# Patient Record
Sex: Female | Born: 1950
Health system: Southern US, Community
[De-identification: ages and names within clinical notes are randomized; demographics above are authoritative.]

## PROBLEM LIST (undated history)

## (undated) DIAGNOSIS — N3941 Urge incontinence: Secondary | ICD-10-CM

## (undated) DIAGNOSIS — M5136 Other intervertebral disc degeneration, lumbar region: Secondary | ICD-10-CM

## (undated) DIAGNOSIS — K219 Gastro-esophageal reflux disease without esophagitis: Secondary | ICD-10-CM

## (undated) DIAGNOSIS — E119 Type 2 diabetes mellitus without complications: Secondary | ICD-10-CM

## (undated) DIAGNOSIS — E785 Hyperlipidemia, unspecified: Secondary | ICD-10-CM

## (undated) DIAGNOSIS — I10 Essential (primary) hypertension: Secondary | ICD-10-CM

## (undated) DIAGNOSIS — M51369 Other intervertebral disc degeneration, lumbar region without mention of lumbar back pain or lower extremity pain: Secondary | ICD-10-CM

## (undated) HISTORY — PX: OTHER SURGICAL HISTORY: SHX169

## (undated) HISTORY — PX: KNEE SURGERY: SHX244

## (undated) HISTORY — PX: FOOT SURGERY: SHX648

## (undated) HISTORY — PX: ABDOMINAL HYSTERECTOMY: SHX81

---

## 2008-01-12 ENCOUNTER — Encounter: Admission: RE | Admit: 2008-01-12 | Discharge: 2008-01-27 | Payer: Self-pay | Admitting: Podiatry

## 2008-01-28 ENCOUNTER — Encounter: Admission: RE | Admit: 2008-01-28 | Discharge: 2008-03-14 | Payer: Self-pay | Admitting: Podiatry

## 2010-01-23 DIAGNOSIS — M25562 Pain in left knee: Secondary | ICD-10-CM | POA: Insufficient documentation

## 2011-06-28 DIAGNOSIS — N3941 Urge incontinence: Secondary | ICD-10-CM | POA: Insufficient documentation

## 2012-04-17 ENCOUNTER — Emergency Department (HOSPITAL_BASED_OUTPATIENT_CLINIC_OR_DEPARTMENT_OTHER)
Admission: EM | Admit: 2012-04-17 | Discharge: 2012-04-17 | Disposition: A | Discharge status: Home / Self Care | Payer: Managed Care, Other (non HMO) | Attending: Emergency Medicine | Admitting: Emergency Medicine

## 2012-04-17 ENCOUNTER — Encounter (HOSPITAL_BASED_OUTPATIENT_CLINIC_OR_DEPARTMENT_OTHER): Payer: Self-pay | Admitting: Emergency Medicine

## 2012-04-17 ENCOUNTER — Emergency Department (HOSPITAL_BASED_OUTPATIENT_CLINIC_OR_DEPARTMENT_OTHER): Payer: Managed Care, Other (non HMO)

## 2012-04-17 DIAGNOSIS — I10 Essential (primary) hypertension: Secondary | ICD-10-CM | POA: Insufficient documentation

## 2012-04-17 DIAGNOSIS — K219 Gastro-esophageal reflux disease without esophagitis: Secondary | ICD-10-CM | POA: Insufficient documentation

## 2012-04-17 DIAGNOSIS — Z8739 Personal history of other diseases of the musculoskeletal system and connective tissue: Secondary | ICD-10-CM | POA: Insufficient documentation

## 2012-04-17 DIAGNOSIS — Z862 Personal history of diseases of the blood and blood-forming organs and certain disorders involving the immune mechanism: Secondary | ICD-10-CM | POA: Insufficient documentation

## 2012-04-17 DIAGNOSIS — Z8639 Personal history of other endocrine, nutritional and metabolic disease: Secondary | ICD-10-CM | POA: Insufficient documentation

## 2012-04-17 DIAGNOSIS — R079 Chest pain, unspecified: Secondary | ICD-10-CM

## 2012-04-17 HISTORY — DX: Other intervertebral disc degeneration, lumbar region: M51.36

## 2012-04-17 HISTORY — DX: Other intervertebral disc degeneration, lumbar region without mention of lumbar back pain or lower extremity pain: M51.369

## 2012-04-17 HISTORY — DX: Essential (primary) hypertension: I10

## 2012-04-17 HISTORY — DX: Urge incontinence: N39.41

## 2012-04-17 HISTORY — DX: Hyperlipidemia, unspecified: E78.5

## 2012-04-17 HISTORY — DX: Gastro-esophageal reflux disease without esophagitis: K21.9

## 2012-04-17 LAB — BASIC METABOLIC PANEL
CO2: 25 mEq/L (ref 19–32)
Calcium: 10 mg/dL (ref 8.4–10.5)
Chloride: 101 mEq/L (ref 96–112)
Sodium: 140 mEq/L (ref 135–145)

## 2012-04-17 LAB — CBC
Hemoglobin: 16.4 g/dL — ABNORMAL HIGH (ref 12.0–15.0)
RBC: 5.23 MIL/uL — ABNORMAL HIGH (ref 3.87–5.11)

## 2012-04-17 LAB — TROPONIN I: Troponin I: <0.3 ng/mL (ref <0.30)

## 2012-04-17 MED ORDER — ASPIRIN 325 MG PO TABS
325.0000 mg | ORAL_TABLET | ORAL | Status: AC
  Administered 2012-04-17: 325 mg via ORAL
  Filled 2012-04-17: qty 1

## 2012-04-17 NOTE — ED Provider Notes (Signed)
History     CSN: [id number]  Arrival date & time 04/17/12  4098   First MD Initiated Contact with Patient 04/17/12 1858      Chief Complaint  Patient presents with  . Chest Pain    (Consider location/radiation/quality/duration/timing/severity/associated sxs/prior treatment) HPI Comments: Pt presents to the ED for chest pain.  Pt states she had an episode of non-radiating, left sided chest pain last night that lasted approx 5 seconds then resolved.  Had another episode this morning that radiated into her jaw and caused her to feel flushed.  Was concerned so pulled into urgent care to be evaluated.  They sent her here for cardiac evaluation.  Hx of HTN but not longer takes medication for this.  No prior MI and has never been evaluated by cardiology. Denies any current chest pain, SOB, abdominal pain, nausea, vomiting, dizziness, fever, chills, or LE edema.  Patient is a 62 y.o. female presenting with chest pain. The history is provided by the patient.  Chest Pain   Past Medical History  Diagnosis Date  . Hypertension   . GERD (gastroesophageal reflux disease)   . Hyperlipidemia   . DDD (degenerative disc disease), lumbar   . Urge incontinence of urine     Past Surgical History  Procedure Laterality Date  . Abdominal hysterectomy    . Arm surgery    . Knee surgery    . Foot surgery      No family history on file.  History  Substance Use Topics  . Smoking status: Never Smoker   . Smokeless tobacco: Not on file  . Alcohol Use: No    OB History   Grav Para Term Preterm Abortions TAB SAB Ect Mult Living                  Review of Systems  Cardiovascular: Positive for chest pain.  All other systems reviewed and are negative.    Allergies  Review of patient's allergies indicates no known allergies.  Home Medications   Current Outpatient Rx  Name  Route  Sig  Dispense  Refill  . omeprazole (PRILOSEC) 20 MG capsule   Oral   Take 20 mg by mouth daily as  needed.           BP 210/91  Pulse 90  Temp(Src) 98.1 F (36.7 C) (Oral)  Resp 20  SpO2 97%  Physical Exam  Nursing note and vitals reviewed. Constitutional: She is oriented to person, place, and time. She appears well-developed and well-nourished.  HENT:  Head: Normocephalic and atraumatic.  Mouth/Throat: Uvula is midline, oropharynx is clear and moist and mucous membranes are normal. No oropharyngeal exudate, posterior oropharyngeal edema, posterior oropharyngeal erythema or tonsillar abscesses.  Eyes: Conjunctivae and EOM are normal. Pupils are equal, round, and reactive to light.  Neck: Normal range of motion. Neck supple.  Cardiovascular: Normal rate, regular rhythm and normal heart sounds.   Pulmonary/Chest: Effort normal and breath sounds normal. No respiratory distress. She has no wheezes. She has no rhonchi. She exhibits no tenderness.  Chest pain is not reproducible with palpation to left chest wall  Abdominal: Soft. Bowel sounds are normal. There is no tenderness. There is no guarding.  Musculoskeletal: Normal range of motion. She exhibits no edema.  Neurological: She is alert and oriented to person, place, and time.  Skin: Skin is warm and dry.  Psychiatric: She has a normal mood and affect.    ED Course  Procedures (including critical care time)  Date: 04/17/2012  Rate: 92  Rhythm: normal sinus rhythm  QRS Axis: normal  Intervals: normal  ST/T Wave abnormalities: nonspecific T wave changes  Conduction Disutrbances:none  Narrative Interpretation:   Old EKG Reviewed: none available    Labs Reviewed  CBC - Abnormal; Notable for the following:    RBC 5.23 (*)    Hemoglobin 16.4 (*)    MCHC 36.7 (*)    All other components within normal limits  TROPONIN I  BASIC METABOLIC PANEL   Dg Chest 2 View  04/17/2012  *RADIOLOGY REPORT*  Clinical Data: Left chest pain  CHEST - 2 VIEW  Comparison: None  Findings: Negative for pneumonia.  Lungs are clear without  infiltrate or effusion.  Negative for heart failure.  No mass lesion.  IMPRESSION: No acute cardiopulmonary abnormality.   Original Report Authenticated By: Janeece Riggers, M.D.      1. Chest pain       MDM   Cardiac work-up negative and pt remained asx in the ED.  Encouraged close follow up with PCP within the next week- copies of labs and imaging reports given.  Pt states she is returning to work [employer].  Declined pain medications.  Return precautions advised.        Garlon Hatchet, PA-C 04/18/12 [phone]

## 2012-04-17 NOTE — ED Notes (Signed)
Pt started with chest pain last night.  Radiating to jaw and neck.  No sob or diaphoresis, no N/V.  Pt feels lethargic.  Sent from urgent care for further evaluation.

## 2012-04-19 NOTE — ED Provider Notes (Signed)
Medical screening examination/treatment/procedure(s) were performed by non-physician practitioner and as supervising physician I was immediately available for consultation/collaboration.  Jovann Luse, MD 04/19/12 0832 

## 2012-05-04 DIAGNOSIS — Z0000 Encounter for general adult medical examination without abnormal findings: Secondary | ICD-10-CM | POA: Insufficient documentation

## 2012-05-06 DIAGNOSIS — R079 Chest pain, unspecified: Secondary | ICD-10-CM | POA: Insufficient documentation

## 2015-11-13 ENCOUNTER — Ambulatory Visit (INDEPENDENT_AMBULATORY_CARE_PROVIDER_SITE_OTHER): Payer: Self-pay | Admitting: Family Medicine

## 2015-11-13 ENCOUNTER — Encounter: Payer: Self-pay | Admitting: Family Medicine

## 2015-11-13 VITALS — BP 159/79 | HR 56 | Temp 96.5°F | Ht 65.0 in | Wt 174.0 lb

## 2015-11-13 DIAGNOSIS — Z7689 Persons encountering health services in other specified circumstances: Secondary | ICD-10-CM

## 2015-11-13 DIAGNOSIS — E119 Type 2 diabetes mellitus without complications: Secondary | ICD-10-CM

## 2015-11-13 LAB — BAYER DCA HB A1C WAIVED: HB A1C (BAYER DCA - WAIVED): 8.3 % — ABNORMAL HIGH (ref <7.0)

## 2015-11-13 MED ORDER — ZOLPIDEM TARTRATE 5 MG PO TABS
5.0000 mg | ORAL_TABLET | Freq: Every evening | ORAL | 0 refills | Status: DC | PRN
Start: 2015-11-13 — End: 2016-03-14

## 2015-11-13 MED ORDER — METFORMIN HCL 1000 MG PO TABS: 1000.0000 mg | ORAL_TABLET | Freq: Two times a day (BID) | ORAL | 3 refills | Status: DC

## 2015-11-13 MED ORDER — LISINOPRIL 10 MG PO TABS: 10.0000 mg | ORAL_TABLET | Freq: Every day | ORAL | 3 refills | Status: DC

## 2015-11-13 NOTE — Progress Notes (Signed)
   Subjective:    Patient ID: Candace Lopez, female    DOB: [birth year], 65 y.o.   MRN: [id number]  HPI Patient here today to establish care. She is new here. She has a history of diabetes, hyperlipidemia and hypertension.   Patient is now retired and does not have insurance. She has been taking her husband's metformin and sugars have generally been between 160 and 220 in the mornings at 500 mg twice a day. She is not taking any medicine for her cholesterol or blood pressure at the current time. She does complaining of some burning in her feet. Also complains of insomnia. Had previously taken Ambien.     There are no active problems to display for this patient.  Outpatient Encounter Prescriptions as of 11/13/2015  Medication Sig  . aspirin 81 MG chewable tablet Chew 81 mg by mouth 2 (two) times daily.  . metFORMIN (GLUCOPHAGE) 500 MG tablet Take by mouth 2 (two) times daily with a meal.  . UNABLE TO FIND Med Name: tumeric  . [DISCONTINUED] omeprazole (PRILOSEC) 20 MG capsule Take 20 mg by mouth daily as needed.   No facility-administered encounter medications on file as of 11/13/2015.       Review of Systems  Constitutional: Negative.   HENT: Negative.   Eyes: Negative.   Respiratory: Negative.   Cardiovascular: Negative.   Gastrointestinal: Negative.   Endocrine: Negative.   Genitourinary: Negative.   Musculoskeletal: Negative.   Skin: Negative.   Allergic/Immunologic: Negative.   Neurological: Positive for numbness (feet "burn").  Hematological: Negative.   Psychiatric/Behavioral: Negative.        Objective:   Physical Exam  Constitutional: She is oriented to person, place, and time. She appears well-developed.  Cardiovascular: Normal rate, regular rhythm and normal heart sounds.   Pulmonary/Chest: Effort normal and breath sounds normal.  Abdominal: Soft.  Neurological: She is alert and oriented to person, place, and time.  Psychiatric: She has a normal mood and  affect. Her behavior is normal.   BP (!) 159/79 (BP Location: Right Arm)   Pulse (!) 56   Temp (!) 96.5 F (35.8 C) (Oral)   Ht '5\' 5"'$  (1.651 m)   Wt 174 lb (78.9 kg)   BMI 28.96 kg/m         Assessment & Plan:  1. Establishing care with new doctor, encounter for We need baseline labs areas she has a history of hyperlipidemia as well as diabetes - Bayer DCA Hb A1c Waived - CMP14+EGFR - Lipid panel  2. Type 2 diabetes mellitus without complication, without long-term current use of insulin ([place]) . Add lisinopril as a renal protective drug and also to lower systolic blood pressure which is elevated today Increase metformin to 1000 mg twice a day based  Fasting blood sugars that she brings in from home. - Bayer DCA Hb A1c Waived - CMP14+EGFR - Lipid panel Wardell Honour MD

## 2015-11-14 LAB — CMP14+EGFR
ALT: 54 IU/L — AB (ref 0–32)
AST: 25 IU/L (ref 0–40)
Albumin/Globulin Ratio: 1.7 (ref 1.2–2.2)
Albumin: 4.6 g/dL (ref 3.6–4.8)
Alkaline Phosphatase: 94 IU/L (ref 39–117)
BUN/Creatinine Ratio: 18 (ref 12–28)
BUN: 10 mg/dL (ref 8–27)
Bilirubin Total: 1.3 mg/dL — ABNORMAL HIGH (ref 0.0–1.2)
CALCIUM: 9.7 mg/dL (ref 8.7–10.3)
CO2: 23 mmol/L (ref 18–29)
CREATININE: 0.55 mg/dL — AB (ref 0.57–1.00)
Chloride: 100 mmol/L (ref 96–106)
GFR calc Af Amer: 115 mL/min/{1.73_m2} (ref >59)
GFR, EST NON AFRICAN AMERICAN: 99 mL/min/{1.73_m2} (ref >59)
GLOBULIN, TOTAL: 2.7 g/dL (ref 1.5–4.5)
GLUCOSE: 188 mg/dL — AB (ref 65–99)
Potassium: 4.4 mmol/L (ref 3.5–5.2)
SODIUM: 141 mmol/L (ref 134–144)
Total Protein: 7.3 g/dL (ref 6.0–8.5)

## 2015-11-14 LAB — LIPID PANEL
CHOL/HDL RATIO: 7.1 ratio — AB (ref 0.0–4.4)
CHOLESTEROL TOTAL: 233 mg/dL — AB (ref 100–199)
HDL: 33 mg/dL — AB (ref >39)
TRIGLYCERIDES: 453 mg/dL — AB (ref 0–149)

## 2016-02-14 ENCOUNTER — Ambulatory Visit: Payer: Self-pay | Admitting: Family Medicine

## 2016-03-13 NOTE — Progress Notes (Signed)
   Subjective:    Patient ID: Candace Lopez, female    DOB: 09/28/1950, 66 y.o.   MRN: [id number]  HPI 66 year old female. This is her second visit here. She does not have any insurance so we are trying to treat her as economically as we can until she gets insurance in August. She had been using some of her husband's metformin when she came here. She has some GI intolerance to metformin and takes it 3 times a day with food and that makes it more tolerable. Sugars have generally been less than 140-50 fasting. Her blood pressure is elevated today about where it was despite starting lisinopril 10 mg. She has not had her blood pressure medicine in over 24 hours so that may have some bearing. She also complains of restless leg syndrome I have given her Ambien because she said that was effective before but that was not the case now.  There are no active problems to display for this patient.  Outpatient Encounter Prescriptions as of 03/14/2016  Medication Sig  . aspirin 81 MG chewable tablet Chew 81 mg by mouth 2 (two) times daily.  Marland Kitchen. lisinopril (PRINIVIL,ZESTRIL) 10 MG tablet Take 1 tablet (10 mg total) by mouth daily.  . metFORMIN (GLUCOPHAGE) 1000 MG tablet Take 1 tablet (1,000 mg total) by mouth 2 (two) times daily with a meal.  . TURMERIC PO Take 1 capsule by mouth.  . [DISCONTINUED] UNABLE TO FIND Med Name: tumeric  . [DISCONTINUED] zolpidem (AMBIEN) 5 MG tablet Take 1 tablet (5 mg total) by mouth at bedtime as needed for sleep.   No facility-administered encounter medications on file as of 03/14/2016.       Review of Systems  Constitutional: Negative.   Respiratory: Negative.   Cardiovascular: Negative.   Genitourinary: Negative.   Psychiatric/Behavioral: Negative.        Objective:   Physical Exam  Constitutional: She is oriented to person, place, and time. She appears well-developed and well-nourished.  Cardiovascular: Normal rate, regular rhythm, normal heart sounds and intact distal  pulses.   Pulmonary/Chest: Effort normal and breath sounds normal.  Neurological: She is alert and oriented to person, place, and time.  Psychiatric: She has a normal mood and affect. Her behavior is normal.   BP (!) 159/90   Pulse 88   Temp 97.7 F (36.5 C) (Oral)   Ht 5\' 5"  (1.651 m)   Wt 178 lb 3.2 oz (80.8 kg)   BMI 29.65 kg/m         Assessment & Plan:  1. Type 2 diabetes mellitus without complication, without long-term current use of insulin (HCC) See below for discussion - Bayer DCA Hb A1c Waived  2. Restless legs Ambien did not help. I think Requip is more expensive and she would be willing to pay at this point in time. We'll consider gabapentin if CBC is normal - CBC with Differential/Platelet  3. Type 2 diabetes mellitus with diabetic neuropathy, without long-term current use of insulin (HCC) Awaiting A1c results  4. Hypertension associated with diabetes (HCC) Continue lisinopril and take at bedtime  5. Hyperlipidemia associated with type 2 diabetes mellitus (HCC) It was felt that her triglycerides being elevated were function of poor diabetes control. Will see where things are today and decide about use of lipid-lowering agents  Frederica KusterStephen M Miller MD

## 2016-03-14 ENCOUNTER — Ambulatory Visit (INDEPENDENT_AMBULATORY_CARE_PROVIDER_SITE_OTHER): Payer: Self-pay | Admitting: Family Medicine

## 2016-03-14 ENCOUNTER — Encounter: Payer: Self-pay | Admitting: Family Medicine

## 2016-03-14 VITALS — BP 159/90 | HR 88 | Temp 97.7°F | Ht 65.0 in | Wt 178.2 lb

## 2016-03-14 DIAGNOSIS — I152 Hypertension secondary to endocrine disorders: Secondary | ICD-10-CM | POA: Insufficient documentation

## 2016-03-14 DIAGNOSIS — E1159 Type 2 diabetes mellitus with other circulatory complications: Secondary | ICD-10-CM

## 2016-03-14 DIAGNOSIS — G2581 Restless legs syndrome: Secondary | ICD-10-CM

## 2016-03-14 DIAGNOSIS — E119 Type 2 diabetes mellitus without complications: Secondary | ICD-10-CM

## 2016-03-14 DIAGNOSIS — E1140 Type 2 diabetes mellitus with diabetic neuropathy, unspecified: Secondary | ICD-10-CM | POA: Insufficient documentation

## 2016-03-14 DIAGNOSIS — E1169 Type 2 diabetes mellitus with other specified complication: Secondary | ICD-10-CM | POA: Insufficient documentation

## 2016-03-14 DIAGNOSIS — E785 Hyperlipidemia, unspecified: Secondary | ICD-10-CM

## 2016-03-14 DIAGNOSIS — I10 Essential (primary) hypertension: Secondary | ICD-10-CM

## 2016-03-14 LAB — CBC WITH DIFFERENTIAL/PLATELET
BASOS ABS: 0 10*3/uL (ref 0.0–0.2)
BASOS: 0 %
EOS (ABSOLUTE): 0.1 10*3/uL (ref 0.0–0.4)
Eos: 2 %
Hematocrit: 42.4 % (ref 34.0–46.6)
Hemoglobin: 14.9 g/dL (ref 11.1–15.9)
IMMATURE GRANS (ABS): 0 10*3/uL (ref 0.0–0.1)
IMMATURE GRANULOCYTES: 1 %
Lymphocytes Absolute: 1.4 10*3/uL (ref 0.7–3.1)
Lymphs: 24 %
MCH: 31.8 pg (ref 26.6–33.0)
MCHC: 35.1 g/dL (ref 31.5–35.7)
MCV: 90 fL (ref 79–97)
Monocytes Absolute: 0.4 10*3/uL (ref 0.1–0.9)
Monocytes: 7 %
NEUTROS PCT: 66 %
Neutrophils Absolute: 3.8 10*3/uL (ref 1.4–7.0)
PLATELETS: 241 10*3/uL (ref 150–379)
RBC: 4.69 x10E6/uL (ref 3.77–5.28)
RDW: 13.2 % (ref 12.3–15.4)
WBC: 5.7 10*3/uL (ref 3.4–10.8)

## 2016-03-14 LAB — BAYER DCA HB A1C WAIVED: HB A1C: 7.4 % — AB (ref <7.0)

## 2016-03-19 ENCOUNTER — Other Ambulatory Visit: Payer: Self-pay | Admitting: *Deleted

## 2016-03-19 MED ORDER — GABAPENTIN 300 MG PO CAPS: 300.0000 mg | ORAL_CAPSULE | Freq: Two times a day (BID) | ORAL | 1 refills | Status: DC

## 2016-03-19 NOTE — Telephone Encounter (Signed)
Gabapentin 300 qhs x 1 week - then increase to BID - after first week - called in per Dr Hyacinth MeekerMiller for pt restless leg that was discussed at last OV

## 2016-05-31 ENCOUNTER — Other Ambulatory Visit: Payer: Self-pay | Admitting: Family Medicine

## 2016-08-04 ENCOUNTER — Other Ambulatory Visit: Payer: Self-pay | Admitting: Family Medicine

## 2016-08-05 NOTE — Telephone Encounter (Signed)
Last seen 03/14/16  Dr Miller 

## 2016-09-24 ENCOUNTER — Telehealth: Payer: Self-pay | Admitting: Family Medicine

## 2016-09-24 NOTE — Telephone Encounter (Signed)
Pharmacy aware that patient has not been seen since February.

## 2016-11-11 ENCOUNTER — Other Ambulatory Visit: Payer: Self-pay | Admitting: Family Medicine

## 2016-11-13 ENCOUNTER — Other Ambulatory Visit: Payer: Self-pay | Admitting: Family Medicine

## 2016-12-15 ENCOUNTER — Other Ambulatory Visit: Payer: Self-pay | Admitting: Family Medicine

## 2016-12-16 NOTE — Telephone Encounter (Signed)
Patient needs follow up for DM.  Refill x1 month.

## 2016-12-16 NOTE — Telephone Encounter (Signed)
Last seen 03/14/16  Dr Miller 

## 2016-12-29 ENCOUNTER — Ambulatory Visit: Payer: Medicare HMO | Admitting: Family Medicine

## 2016-12-29 ENCOUNTER — Encounter: Payer: Self-pay | Admitting: Family Medicine

## 2016-12-29 VITALS — BP 143/83 | HR 88 | Temp 97.1°F | Ht 65.0 in | Wt 175.0 lb

## 2016-12-29 DIAGNOSIS — G2581 Restless legs syndrome: Secondary | ICD-10-CM | POA: Diagnosis not present

## 2016-12-29 DIAGNOSIS — E785 Hyperlipidemia, unspecified: Secondary | ICD-10-CM

## 2016-12-29 DIAGNOSIS — Z23 Encounter for immunization: Secondary | ICD-10-CM

## 2016-12-29 DIAGNOSIS — I10 Essential (primary) hypertension: Secondary | ICD-10-CM

## 2016-12-29 DIAGNOSIS — I152 Hypertension secondary to endocrine disorders: Secondary | ICD-10-CM

## 2016-12-29 DIAGNOSIS — E1159 Type 2 diabetes mellitus with other circulatory complications: Secondary | ICD-10-CM | POA: Diagnosis not present

## 2016-12-29 DIAGNOSIS — E1169 Type 2 diabetes mellitus with other specified complication: Secondary | ICD-10-CM

## 2016-12-29 DIAGNOSIS — E1140 Type 2 diabetes mellitus with diabetic neuropathy, unspecified: Secondary | ICD-10-CM

## 2016-12-29 LAB — URINALYSIS
BILIRUBIN UA: NEGATIVE
KETONES UA: NEGATIVE
Leukocytes, UA: NEGATIVE
Nitrite, UA: NEGATIVE
Protein, UA: NEGATIVE
RBC UA: NEGATIVE
SPEC GRAV UA: 1.025 (ref 1.005–1.030)
UUROB: 0.2 mg/dL (ref 0.2–1.0)
pH, UA: 5.5 (ref 5.0–7.5)

## 2016-12-29 LAB — BAYER DCA HB A1C WAIVED: HB A1C (BAYER DCA - WAIVED): 8.6 % — ABNORMAL HIGH (ref <7.0)

## 2016-12-29 MED ORDER — GLUCOSE BLOOD VI STRP: ORAL_STRIP | 12 refills | Status: DC

## 2016-12-29 MED ORDER — METFORMIN HCL 1000 MG PO TABS: 1000.0000 mg | ORAL_TABLET | Freq: Two times a day (BID) | ORAL | 1 refills | Status: DC

## 2016-12-29 MED ORDER — LANCETS ULTRA THIN MISC: 3 refills | Status: DC

## 2016-12-29 MED ORDER — LISINOPRIL 20 MG PO TABS: 20.0000 mg | ORAL_TABLET | Freq: Every day | ORAL | 1 refills | Status: DC

## 2016-12-29 MED ORDER — BLOOD GLUCOSE MONITOR SYSTEM W/DEVICE KIT: 1.0000 | PACK | Freq: Two times a day (BID) | 0 refills | Status: DC

## 2016-12-29 MED ORDER — GABAPENTIN 300 MG PO CAPS: 300.0000 mg | ORAL_CAPSULE | Freq: Two times a day (BID) | ORAL | 1 refills | Status: DC

## 2016-12-29 NOTE — Addendum Note (Signed)
Addended by: Margurite AuerbachOMPTON, KARLA G on: 12/29/2016 03:27 PM   Modules accepted: Orders

## 2016-12-29 NOTE — Progress Notes (Signed)
Subjective:  Patient ID: Candace Lopez,  female    DOB: 1950-10-21  Age: 66 y.o.    CC: Diabetes (pt here today to establish care with Dr Livia Snellen since she was a Dr Sabra Heck pt and to follow up with her chronic medical conditions )   HPI Candace Lopez presents for  follow-up of hypertension. Patient has no history of headache chest pain or shortness of breath or recent cough. Patient also denies symptoms of TIA such as numbness weakness lateralizing. Patient denies side effects from medication. States taking it regularly.  Patient also  in for follow-up of elevated cholesterol. Doing well without complaints on current medication. Denies side effects of statin including myalgia and arthralgia and nausea. Also in today for liver function testing. Currently no chest pain, shortness of breath or other cardiovascular related symptoms noted.  Follow-up of diabetes. Patient does not check her blood sugar at home but she would like to start.  She brings in an old monitor.  She would like to have an updated version.  She says the only one that is with her is 1 but worked very well for her husband in the past and she would like to have something similar.  Additionally she has been a diabetic for 2-3 years and she understands how she should eat and his weight but realizes she has not been doing that for some time.  She has been diagnosed with a diabetic neuropathy.  She says that her feet go numb and start burning at times.  It is moderate discomfort but is well controlled by her gabapentin.  If by some chance the burning is worse and she will use a mineral oil to rub on it.  She also has restless leg syndrome.  She states that his been there for 30 years.  The gabapentin also helps with that as does the toilet and she is satisfied with that.  She uses turmeric for gastroesophageal reflux disease.  She took Nexium but gave her such as severe headache that she discontinued it and she is not tried anything else by  prescription since the tumor records so well for her.  She is concerned about a heart test that was done 3-4 years ago at [facility] by her former physician Dr. Percell Miller.  This was Dr. Eilene Ghazi a lady doctor.  Apparently there was something abnormal but she does not remember what.  What she describes sounds like it wasa stress echocardiogram.  But there is no way to be certain at this time.  She denies any active symptoms of angina.  She denies shortness of breath.  She does not have any hypoglycemia.  Her medications are as noted below she does continue to take them. Patient denies symptoms such as polyuria, polydipsia, excessive hunger, nausea No significant hypoglycemic spells noted. Medications reviewed. Pt reports taking them regularly. Pt. denies complication/adverse reaction today.    History Signa has a past medical history of DDD (degenerative disc disease), lumbar, GERD (gastroesophageal reflux disease), Hyperlipidemia, Hypertension, and Urge incontinence of urine.   She has a past surgical history that includes Abdominal hysterectomy; arm surgery; Knee surgery; and Foot surgery.   Her family history includes Cancer in her father, maternal aunt, maternal grandfather, mother, other, and paternal uncle; Diabetes in her brother, sister, and son; Heart disease in her paternal grandfather.She reports that  has never smoked. she has never used smokeless tobacco. She reports that she does not drink alcohol or use drugs.  Current Outpatient Medications on File Prior to Visit  Medication Sig Dispense Refill  . aspirin 81 MG chewable tablet Chew 81 mg by mouth 2 (two) times daily.    . TURMERIC PO Take 1 capsule by mouth.     No current facility-administered medications on file prior to visit.     ROS Review of Systems  Constitutional: Negative for activity change, appetite change and fever.  HENT: Negative for congestion, rhinorrhea and sore throat.   Eyes: Negative for  visual disturbance.  Respiratory: Negative for cough and shortness of breath.   Cardiovascular: Negative for chest pain and palpitations.  Gastrointestinal: Negative for abdominal pain, diarrhea and nausea.  Genitourinary: Negative for dysuria.  Musculoskeletal: Negative for arthralgias and myalgias.    Objective:  BP (!) 143/83   Pulse 88   Temp (!) 97.1 F (36.2 C) (Oral)   Ht _0  (1.651 m)   Wt 175 lb (79.4 kg)   BMI 29.12 kg/m   BP Readings from Last 3 Encounters:  12/29/16 (!) 143/83  03/14/16 (!) 159/90  11/13/15 (!) 159/79    Wt Readings from Last 3 Encounters:  12/29/16 175 lb (79.4 kg)  03/14/16 178 lb 3.2 oz (80.8 kg)  11/13/15 174 lb (78.9 kg)     Physical Exam  Constitutional: She is oriented to person, place, and time. She appears well-developed and well-nourished. No distress.  HENT:  Head: Normocephalic and atraumatic.  Right Ear: External ear normal.  Left Ear: External ear normal.  Nose: Nose normal.  Mouth/Throat: Oropharynx is clear and moist.  Eyes: Conjunctivae and EOM are normal. Pupils are equal, round, and reactive to light.  Neck: Normal range of motion. Neck supple. No thyromegaly present.  Cardiovascular: Normal rate, regular rhythm and normal heart sounds.  No murmur heard. Pulmonary/Chest: Effort normal and breath sounds normal. No respiratory distress. She has no wheezes. She has no rales.  Abdominal: Soft. Bowel sounds are normal. She exhibits no distension. There is no tenderness.  Lymphadenopathy:    She has no cervical adenopathy.  Neurological: She is alert and oriented to person, place, and time. She has normal reflexes.  Skin: Skin is warm and dry.  Psychiatric: She has a normal mood and affect. Her behavior is normal. Judgment and thought content normal.    Diabetic Foot Exam - Simple   Simple Foot Form Diabetic Foot exam was performed with the following findings:  Yes 12/29/2016  1:30 PM  Visual Inspection No deformities,  no ulcerations, no other skin breakdown bilaterally:  Yes Sensation Testing Intact to touch and monofilament testing bilaterally:  Yes Pulse Check Posterior Tibialis and Dorsalis pulse intact bilaterally:  Yes Comments       Assessment & Plan:   Candace Lopez was seen today for diabetes.  Diagnoses and all orders for this visit:  Type 2 diabetes mellitus with diabetic neuropathy, without long-term current use of insulin (HCC) -     CBC with Differential/Platelet -     CMP14+EGFR -     Microalbumin / creatinine urine ratio -     Bayer DCA Hb A1c Waived -     Urinalysis  Hypertension associated with diabetes ([place]) -     CBC with Differential/Platelet -     CMP14+EGFR  Hyperlipidemia associated with type 2 diabetes mellitus (HCC) -     CBC with Differential/Platelet -     CMP14+EGFR -     Lipid panel  Restless leg syndrome, familial, uncontrolled  Encounter for immunization -  Flu vaccine HIGH DOSE PF  Other orders -     lisinopril (PRINIVIL,ZESTRIL) 20 MG tablet; Take 1 tablet (20 mg total) by mouth at bedtime. -     Blood Glucose Monitoring Suppl (BLOOD GLUCOSE MONITOR SYSTEM) w/Device KIT; 1 Device by Does not apply route 2 (two) times daily. -     LANCETS ULTRA THIN MISC; Use to check blood sugar twice daily. -     glucose blood test strip; Use as instructed -     gabapentin (NEURONTIN) 300 MG capsule; Take 1 capsule (300 mg total) by mouth 2 (two) times daily. -     metFORMIN (GLUCOPHAGE) 1000 MG tablet; Take 1 tablet (1,000 mg total) by mouth 2 (two) times daily with a meal.   I have discontinued Vasiliki Dung's gabapentin and lisinopril. I have also changed her gabapentin and metFORMIN. Additionally, I am having her start on lisinopril, Blood Glucose Monitor System, LANCETS ULTRA THIN, and glucose blood. Lastly, I am having her maintain her aspirin and TURMERIC PO.  Meds ordered this encounter  Medications  . lisinopril (PRINIVIL,ZESTRIL) 20 MG tablet    Sig: Take 1  tablet (20 mg total) by mouth at bedtime.    Dispense:  90 tablet    Refill:  1  . Blood Glucose Monitoring Suppl (BLOOD GLUCOSE MONITOR SYSTEM) w/Device KIT    Sig: 1 Device by Does not apply route 2 (two) times daily.    Dispense:  1 each    Refill:  0  . LANCETS ULTRA THIN MISC    Sig: Use to check blood sugar twice daily.    Dispense:  200 each    Refill:  3  . glucose blood test strip    Sig: Use as instructed    Dispense:  100 each    Refill:  12  . gabapentin (NEURONTIN) 300 MG capsule    Sig: Take 1 capsule (300 mg total) by mouth 2 (two) times daily.    Dispense:  180 capsule    Refill:  1    Please consider 90 day supplies to promote better adherence  . metFORMIN (GLUCOPHAGE) 1000 MG tablet    Sig: Take 1 tablet (1,000 mg total) by mouth 2 (two) times daily with a meal.    Dispense:  180 tablet    Refill:  1    Needs OV for further fills   Review of simple versus complex carbs.  Review of checking blood sugar twice daily fasting and postprandial.  Diabetic glucose log sheet and blood pressure log she given.  Old records reviewed.  Follow-up: Return in about 3 months (around 03/29/2017).  Claretta Fraise, M.D.

## 2016-12-30 ENCOUNTER — Other Ambulatory Visit: Payer: Self-pay | Admitting: Family Medicine

## 2016-12-30 LAB — CBC WITH DIFFERENTIAL/PLATELET
BASOS ABS: 0 10*3/uL (ref 0.0–0.2)
Basos: 0 %
EOS (ABSOLUTE): 0.2 10*3/uL (ref 0.0–0.4)
Eos: 3 %
Hematocrit: 44.3 % (ref 34.0–46.6)
Hemoglobin: 15.5 g/dL (ref 11.1–15.9)
Immature Grans (Abs): 0 10*3/uL (ref 0.0–0.1)
Immature Granulocytes: 0 %
Lymphocytes Absolute: 1.8 10*3/uL (ref 0.7–3.1)
Lymphs: 26 %
MCH: 31.3 pg (ref 26.6–33.0)
MCHC: 35 g/dL (ref 31.5–35.7)
MCV: 89 fL (ref 79–97)
MONOCYTES: 5 %
Monocytes Absolute: 0.4 10*3/uL (ref 0.1–0.9)
Neutrophils Absolute: 4.5 10*3/uL (ref 1.4–7.0)
Neutrophils: 66 %
PLATELETS: 264 10*3/uL (ref 150–379)
RBC: 4.96 x10E6/uL (ref 3.77–5.28)
RDW: 13.2 % (ref 12.3–15.4)
WBC: 6.9 10*3/uL (ref 3.4–10.8)

## 2016-12-30 LAB — CMP14+EGFR
ALK PHOS: 67 IU/L (ref 39–117)
ALT: 44 IU/L — AB (ref 0–32)
AST: 26 IU/L (ref 0–40)
Albumin/Globulin Ratio: 2 (ref 1.2–2.2)
Albumin: 4.8 g/dL (ref 3.6–4.8)
BUN/Creatinine Ratio: 19 (ref 12–28)
BUN: 11 mg/dL (ref 8–27)
Bilirubin Total: 0.9 mg/dL (ref 0.0–1.2)
CHLORIDE: 100 mmol/L (ref 96–106)
CO2: 21 mmol/L (ref 20–29)
Calcium: 9.7 mg/dL (ref 8.7–10.3)
Creatinine, Ser: 0.59 mg/dL (ref 0.57–1.00)
GFR calc non Af Amer: 96 mL/min/{1.73_m2} (ref >59)
GFR, EST AFRICAN AMERICAN: 111 mL/min/{1.73_m2} (ref >59)
GLUCOSE: 143 mg/dL — AB (ref 65–99)
Globulin, Total: 2.4 g/dL (ref 1.5–4.5)
POTASSIUM: 4.7 mmol/L (ref 3.5–5.2)
Sodium: 139 mmol/L (ref 134–144)
Total Protein: 7.2 g/dL (ref 6.0–8.5)

## 2016-12-30 LAB — MICROALBUMIN / CREATININE URINE RATIO
CREATININE, UR: 108.9 mg/dL
Microalb/Creat Ratio: 13.4 mg/g creat (ref 0.0–30.0)
Microalbumin, Urine: 14.6 ug/mL

## 2016-12-30 LAB — LIPID PANEL
CHOLESTEROL TOTAL: 249 mg/dL — AB (ref 100–199)
Chol/HDL Ratio: 9.6 ratio — ABNORMAL HIGH (ref 0.0–4.4)
HDL: 26 mg/dL — AB (ref >39)
TRIGLYCERIDES: 963 mg/dL — AB (ref 0–149)

## 2016-12-30 MED ORDER — SITAGLIPTIN PHOSPHATE 100 MG PO TABS: 100.0000 mg | ORAL_TABLET | Freq: Every day | ORAL | 2 refills | Status: DC

## 2016-12-30 MED ORDER — ATORVASTATIN CALCIUM 40 MG PO TABS: 40.0000 mg | ORAL_TABLET | Freq: Every day | ORAL | 3 refills | Status: DC

## 2016-12-30 MED ORDER — METFORMIN HCL 1000 MG PO TABS: 1000.0000 mg | ORAL_TABLET | Freq: Two times a day (BID) | ORAL | 1 refills | Status: DC

## 2016-12-30 MED ORDER — ONETOUCH VERIO W/DEVICE KIT: 1.0000 | PACK | Freq: Once | 0 refills | Status: AC

## 2016-12-30 NOTE — Addendum Note (Signed)
Addended by: Julious PayerHOLT, Osmel Dykstra D on: 12/30/2016 10:54 AM   Modules accepted: Orders

## 2016-12-31 ENCOUNTER — Telehealth: Payer: Self-pay | Admitting: *Deleted

## 2016-12-31 MED ORDER — GLUCOSE BLOOD VI STRP: ORAL_STRIP | 12 refills | Status: DC

## 2016-12-31 NOTE — Telephone Encounter (Signed)
Fax rcvd request type of BG test strips to fill Sending One touch Verio to [facility] instead of [facility] since this is where I helped patient send the meter to the other day

## 2017-01-02 ENCOUNTER — Other Ambulatory Visit: Payer: Self-pay | Admitting: Family Medicine

## 2017-01-02 MED ORDER — LANCETS ULTRA THIN MISC: 3 refills | Status: DC

## 2017-01-02 NOTE — Telephone Encounter (Signed)
done

## 2017-02-17 ENCOUNTER — Other Ambulatory Visit: Payer: Self-pay | Admitting: *Deleted

## 2017-02-17 ENCOUNTER — Telehealth: Payer: Self-pay | Admitting: Family Medicine

## 2017-02-17 DIAGNOSIS — R69 Illness, unspecified: Secondary | ICD-10-CM | POA: Diagnosis not present

## 2017-02-17 MED ORDER — GLUCOSE BLOOD VI STRP: ORAL_STRIP | 12 refills | Status: DC

## 2017-02-17 NOTE — Progress Notes (Signed)
RX sent in for incorrect test strips RX changed per pt request

## 2017-02-17 NOTE — Telephone Encounter (Signed)
Directions changed on RX and resubmitted to [facility]

## 2017-03-10 ENCOUNTER — Encounter: Payer: Self-pay | Admitting: Family Medicine

## 2017-03-10 ENCOUNTER — Ambulatory Visit (INDEPENDENT_AMBULATORY_CARE_PROVIDER_SITE_OTHER): Payer: Medicare HMO | Admitting: Family Medicine

## 2017-03-10 VITALS — BP 138/74 | HR 84 | Temp 97.4°F | Ht 65.0 in | Wt 171.0 lb

## 2017-03-10 DIAGNOSIS — M25561 Pain in right knee: Secondary | ICD-10-CM

## 2017-03-10 DIAGNOSIS — G2581 Restless legs syndrome: Secondary | ICD-10-CM

## 2017-03-10 DIAGNOSIS — M25562 Pain in left knee: Secondary | ICD-10-CM | POA: Diagnosis not present

## 2017-03-10 MED ORDER — DICLOFENAC SODIUM 75 MG PO TBEC: 75.0000 mg | DELAYED_RELEASE_TABLET | Freq: Two times a day (BID) | ORAL | 0 refills | Status: DC

## 2017-03-10 MED ORDER — ROPINIROLE HCL 0.5 MG PO TABS: 0.5000 mg | ORAL_TABLET | Freq: Three times a day (TID) | ORAL | 0 refills | Status: DC

## 2017-03-10 NOTE — Progress Notes (Signed)
Subjective:  Patient ID: Candace Lopez, female    DOB: 08/30/1950  Age: 67 y.o. MRN: [id number]  CC: Knee Pain (pt here today c/o knee pain for the past 5 days like a "tooth ache" but then today woke up and they aren't bothering her)   HPI [name] presents for 5 days of knee pain last week with no known injury or precipitating factor.  The pain was excruciating.  It kept her from going about her usual routine although she could do ADLs.  She tried ibuprofen 800 mg twice daily she rubbed them down with liniment.  Nothing seemed to help until 3 days ago she took 1 of her husbands Tylenol 3.  Since that time she has been essentially pain-free.  He says that she has been told in the past she has arthritis.  Additionally the patient has discontinued her gabapentin because taking up to 5 pills a day did not help her restless legs.  She would like to have a new treatment plan for that condition.  She says she is up 2-3 times a night.  She has been taking some over-the-counter pills but has had no relief. Depression screen [facility] 2/9 12/29/2016 03/14/2016 11/13/2015  Decreased Interest 0 0 0  Down, Depressed, Hopeless 0 0 0  PHQ - 2 Score 0 0 0    History [name] has a past medical history of DDD (degenerative disc disease), lumbar, GERD (gastroesophageal reflux disease), Hyperlipidemia, Hypertension, and Urge incontinence of urine.   She has a past surgical history that includes Abdominal hysterectomy; arm surgery; Knee surgery; and Foot surgery.   Her family history includes Cancer in her father, maternal aunt, maternal grandfather, mother, other, and paternal uncle; Diabetes in her brother, sister, and son; Heart disease in her paternal grandfather.She reports that  has never smoked. she has never used smokeless tobacco. She reports that she does not drink alcohol or use drugs.    ROS Review of Systems  Constitutional: Negative for activity change, appetite change and fever.  HENT: Negative for  congestion, rhinorrhea and sore throat.   Eyes: Negative for visual disturbance.  Respiratory: Negative for cough and shortness of breath.   Cardiovascular: Negative for chest pain and palpitations.  Gastrointestinal: Negative for abdominal pain, diarrhea and nausea.  Genitourinary: Negative for dysuria.  Musculoskeletal: Positive for arthralgias and myalgias.  Neurological: Negative for weakness and numbness.    Objective:  BP 138/74   Pulse 84   Temp (!) 97.4 F (36.3 C) (Oral)   Ht 5' 5"  (1.651 m)   Wt 171 lb (77.6 kg)   BMI 28.46 kg/m   BP Readings from Last 3 Encounters:  03/10/17 138/74  12/29/16 (!) 143/83  03/14/16 (!) 159/90    Wt Readings from Last 3 Encounters:  03/10/17 171 lb (77.6 kg)  12/29/16 175 lb (79.4 kg)  03/14/16 178 lb 3.2 oz (80.8 kg)     Physical Exam  Constitutional: She is oriented to person, place, and time. She appears well-developed and well-nourished. No distress.  HENT:  Head: Normocephalic and atraumatic.  Right Ear: External ear normal.  Left Ear: External ear normal.  Nose: Nose normal.  Mouth/Throat: Oropharynx is clear and moist.  Eyes: Conjunctivae and EOM are normal. Pupils are equal, round, and reactive to light.  Neck: Normal range of motion. Neck supple. No thyromegaly present.  Cardiovascular: Normal rate, regular rhythm and normal heart sounds.  No murmur heard. Pulmonary/Chest: Effort normal and breath sounds normal. No respiratory distress.  She has no wheezes. She has no rales.  Abdominal: Soft. Bowel sounds are normal. She exhibits no distension. There is no tenderness.  Lymphadenopathy:    She has no cervical adenopathy.  Neurological: She is alert and oriented to person, place, and time. She has normal reflexes.  Skin: Skin is warm and dry.  Psychiatric: She has a normal mood and affect. Her behavior is normal. Judgment and thought content normal.      Assessment & Plan:   Cheryln was seen today for knee  pain.  Diagnoses and all orders for this visit:  Acute pain of both knees  Restless leg syndrome, familial, uncontrolled  Other orders -     diclofenac (VOLTAREN) 75 MG EC tablet; Take 1 tablet (75 mg total) by mouth 2 (two) times daily. -     rOPINIRole (REQUIP) 0.5 MG tablet; Take 1 tablet (0.5 mg total) by mouth 3 (three) times daily.       I have discontinued Karissa Howden's gabapentin. I am also having her start on diclofenac and rOPINIRole. Additionally, I am having her maintain her aspirin, TURMERIC PO, lisinopril, metFORMIN, sitaGLIPtin, atorvastatin, LANCETS ULTRA THIN, glucose blood, and ONE TOUCH ULTRA 2.  Allergies as of 03/10/2017   No Known Allergies     Medication List        Accurate as of 03/10/17 11:13 AM. Always use your most recent med list.          aspirin 81 MG chewable tablet Chew 81 mg by mouth 2 (two) times daily.   atorvastatin 40 MG tablet Commonly known as:  LIPITOR Take 1 tablet (40 mg total) by mouth daily. For cholesterol   diclofenac 75 MG EC tablet Commonly known as:  VOLTAREN Take 1 tablet (75 mg total) by mouth 2 (two) times daily.   glucose blood test strip Check blood glucose twice daily   LANCETS ULTRA THIN Misc Use to check blood sugar twice daily.   lisinopril 20 MG tablet Commonly known as:  PRINIVIL,ZESTRIL Take 1 tablet (20 mg total) by mouth at bedtime.   metFORMIN 1000 MG tablet Commonly known as:  GLUCOPHAGE Take 1 tablet (1,000 mg total) by mouth 2 (two) times daily with a meal.   ONE TOUCH ULTRA 2 w/Device Kit 1 KIT BY DOES NOT APPLY ROUTE ONCE FOR 1 DOSE.   rOPINIRole 0.5 MG tablet Commonly known as:  REQUIP Take 1 tablet (0.5 mg total) by mouth 3 (three) times daily.   sitaGLIPtin 100 MG tablet Commonly known as:  JANUVIA Take 1 tablet (100 mg total) by mouth daily.   TURMERIC PO Take 1 capsule by mouth.        Follow-up: Return in about 3 weeks (around 03/31/2017) for diabetes, cholesterol,  Arthritis.  Claretta Fraise, M.D.

## 2017-03-30 ENCOUNTER — Ambulatory Visit: Payer: Medicare HMO | Admitting: Family Medicine

## 2017-03-30 ENCOUNTER — Encounter: Payer: Self-pay | Admitting: Family Medicine

## 2017-03-30 ENCOUNTER — Other Ambulatory Visit: Payer: Self-pay | Admitting: Family Medicine

## 2017-03-30 VITALS — BP 118/69 | HR 75 | Temp 96.7°F | Ht 65.0 in | Wt 174.0 lb

## 2017-03-30 DIAGNOSIS — G2581 Restless legs syndrome: Secondary | ICD-10-CM

## 2017-03-30 DIAGNOSIS — E1140 Type 2 diabetes mellitus with diabetic neuropathy, unspecified: Secondary | ICD-10-CM

## 2017-03-30 DIAGNOSIS — E1159 Type 2 diabetes mellitus with other circulatory complications: Secondary | ICD-10-CM

## 2017-03-30 DIAGNOSIS — I10 Essential (primary) hypertension: Secondary | ICD-10-CM | POA: Diagnosis not present

## 2017-03-30 DIAGNOSIS — I152 Hypertension secondary to endocrine disorders: Secondary | ICD-10-CM

## 2017-03-30 DIAGNOSIS — E1169 Type 2 diabetes mellitus with other specified complication: Secondary | ICD-10-CM

## 2017-03-30 DIAGNOSIS — E785 Hyperlipidemia, unspecified: Secondary | ICD-10-CM

## 2017-03-30 MED ORDER — TRAZODONE HCL 150 MG PO TABS: 150.0000 mg | ORAL_TABLET | Freq: Every day | ORAL | 0 refills | Status: DC

## 2017-03-30 MED ORDER — METFORMIN HCL 1000 MG PO TABS: 1000.0000 mg | ORAL_TABLET | Freq: Two times a day (BID) | ORAL | 1 refills | Status: DC

## 2017-03-30 MED ORDER — LISINOPRIL 20 MG PO TABS: 20.0000 mg | ORAL_TABLET | Freq: Every day | ORAL | 1 refills | Status: DC

## 2017-03-30 MED ORDER — CARBIDOPA-LEVODOPA 10-100 MG PO TABS: 1.0000 | ORAL_TABLET | Freq: Three times a day (TID) | ORAL | 0 refills | Status: DC

## 2017-03-30 NOTE — Progress Notes (Signed)
Subjective:  Patient ID: Candace Lopez, female    DOB: [birth year]  Age: 67 y.o. MRN: [id number]  CC: Diabetes (pt here today for routine follow up of her chronic medical conditions)   HPI Candace Lopez presents forFollow-up of diabetes. Patient checks blood sugar at home.   130s fasting and 150-175  postprandial Patient denies symptoms such as polyuria, polydipsia, excessive hunger, nausea No significant hypoglycemic spells noted. Medications reviewed. Pt reports taking them regularly without complication/adverse reaction being reported today.  Checking feet daily.  Follow-up of hypertension. Patient has no history of headache chest pain or shortness of breath or recent cough. Patient also denies symptoms of TIA such as numbness weakness lateralizing. Patient checks  blood pressure at home and has not had any elevated readings recently. Patient denies side effects from his medication. States taking it regularly. Patient states that she could not tolerate ropinirole due to excessive sleepiness and discontinued after several days. History Candace Lopez has a past medical history of DDD (degenerative disc disease), lumbar, GERD (gastroesophageal reflux disease), Hyperlipidemia, Hypertension, and Urge incontinence of urine.   She has a past surgical history that includes Abdominal hysterectomy; arm surgery; Knee surgery; and Foot surgery.   Her family history includes Cancer in her father, maternal aunt, maternal grandfather, mother, other, and paternal uncle; Diabetes in her brother, sister, and son; Heart disease in her paternal grandfather.She reports that  has never smoked. she has never used smokeless tobacco. She reports that she does not drink alcohol or use drugs.  Current Outpatient Medications on File Prior to Visit  Medication Sig Dispense Refill  . aspirin 81 MG chewable tablet Chew 81 mg by mouth 2 (two) times daily.    Marland Kitchen atorvastatin (LIPITOR) 40 MG tablet Take 1 tablet (40 mg total) by  mouth daily. For cholesterol 90 tablet 3  . Blood Glucose Monitoring Suppl (ONE TOUCH ULTRA 2) w/Device KIT 1 KIT BY DOES NOT APPLY ROUTE ONCE FOR 1 DOSE.  0  . glucose blood test strip Check blood glucose twice daily 100 each 12  . LANCETS ULTRA THIN MISC Use to check blood sugar twice daily. 200 each 3  . TURMERIC PO Take 1 capsule by mouth.     No current facility-administered medications on file prior to visit.     ROS Review of Systems  Constitutional: Negative for activity change, appetite change and fever.  HENT: Negative for congestion, rhinorrhea and sore throat.   Eyes: Negative for visual disturbance.  Respiratory: Negative for cough and shortness of breath.   Cardiovascular: Negative for chest pain and palpitations.  Gastrointestinal: Negative for abdominal pain, diarrhea and nausea.  Genitourinary: Negative for dysuria.  Musculoskeletal: Negative for arthralgias and myalgias.    Objective:  BP 118/69   Pulse 75   Temp (!) 96.7 F (35.9 C) (Oral)   Ht 5' 5"  (1.651 m)   Wt 174 lb (78.9 kg)   BMI 28.96 kg/m   BP Readings from Last 3 Encounters:  03/30/17 118/69  03/10/17 138/74  12/29/16 (!) 143/83    Wt Readings from Last 3 Encounters:  03/30/17 174 lb (78.9 kg)  03/10/17 171 lb (77.6 kg)  12/29/16 175 lb (79.4 kg)     Physical Exam  Constitutional: She is oriented to person, place, and time. She appears well-developed and well-nourished. No distress.  HENT:  Head: Normocephalic and atraumatic.  Right Ear: External ear normal.  Left Ear: External ear normal.  Nose: Nose normal.  Mouth/Throat: Oropharynx is clear and  moist.  Eyes: Conjunctivae and EOM are normal. Pupils are equal, round, and reactive to light.  Neck: Normal range of motion. Neck supple. No thyromegaly present.  Cardiovascular: Normal rate, regular rhythm and normal heart sounds.  No murmur heard. Pulmonary/Chest: Effort normal and breath sounds normal. No respiratory distress. She has  no wheezes. She has no rales.  Abdominal: Soft. Bowel sounds are normal. She exhibits no distension. There is no tenderness.  Lymphadenopathy:    She has no cervical adenopathy.  Neurological: She is alert and oriented to person, place, and time. She has normal reflexes.  Skin: Skin is warm and dry.  Psychiatric: She has a normal mood and affect. Her behavior is normal. Judgment and thought content normal.     Assessment & Plan:   Welma was seen today for diabetes.  Diagnoses and all orders for this visit:  Type 2 diabetes mellitus with diabetic neuropathy, without long-term current use of insulin ([place]) -     Bayer DCA Hb A1c Waived; Future -     Microalbumin / creatinine urine ratio; Future  Hypertension associated with diabetes ([place])  Hyperlipidemia associated with type 2 diabetes mellitus ([place]) -     CMP14+EGFR; Future -     Lipid panel; Future  Restless leg syndrome, familial, uncontrolled  Other orders -     lisinopril (PRINIVIL,ZESTRIL) 20 MG tablet; Take 1 tablet (20 mg total) by mouth at bedtime. -     metFORMIN (GLUCOPHAGE) 1000 MG tablet; Take 1 tablet (1,000 mg total) by mouth 2 (two) times daily with a meal. -     traZODone (DESYREL) 150 MG tablet; Take 1 tablet (150 mg total) by mouth at bedtime. -     carbidopa-levodopa (SINEMET) 10-100 MG tablet; Take 1 tablet by mouth 3 (three) times daily.      I have discontinued Ovella Nicholes's sitaGLIPtin and rOPINIRole. I am also having her start on traZODone and carbidopa-levodopa. Additionally, I am having her maintain her aspirin, TURMERIC PO, atorvastatin, LANCETS ULTRA THIN, glucose blood, ONE TOUCH ULTRA 2, lisinopril, and metFORMIN.  Meds ordered this encounter  Medications  . lisinopril (PRINIVIL,ZESTRIL) 20 MG tablet    Sig: Take 1 tablet (20 mg total) by mouth at bedtime.    Dispense:  90 tablet    Refill:  1  . metFORMIN (GLUCOPHAGE) 1000 MG tablet    Sig: Take 1 tablet (1,000 mg total) by mouth 2 (two)  times daily with a meal.    Dispense:  180 tablet    Refill:  1  . traZODone (DESYREL) 150 MG tablet    Sig: Take 1 tablet (150 mg total) by mouth at bedtime.    Dispense:  90 tablet    Refill:  0  . carbidopa-levodopa (SINEMET) 10-100 MG tablet    Sig: Take 1 tablet by mouth 3 (three) times daily.    Dispense:  270 tablet    Refill:  0     Follow-up: Return in about 3 months (around 06/30/2017).  Claretta Fraise, M.D.

## 2017-03-31 ENCOUNTER — Encounter: Payer: Self-pay | Admitting: Family Medicine

## 2017-03-31 ENCOUNTER — Other Ambulatory Visit: Payer: Medicare HMO

## 2017-03-31 DIAGNOSIS — E785 Hyperlipidemia, unspecified: Secondary | ICD-10-CM | POA: Diagnosis not present

## 2017-03-31 DIAGNOSIS — E1169 Type 2 diabetes mellitus with other specified complication: Secondary | ICD-10-CM

## 2017-03-31 DIAGNOSIS — E1140 Type 2 diabetes mellitus with diabetic neuropathy, unspecified: Secondary | ICD-10-CM

## 2017-03-31 LAB — BAYER DCA HB A1C WAIVED: HB A1C (BAYER DCA - WAIVED): 7.6 % — ABNORMAL HIGH (ref <7.0)

## 2017-04-01 LAB — CMP14+EGFR
ALBUMIN: 4.6 g/dL (ref 3.6–4.8)
ALK PHOS: 50 IU/L (ref 39–117)
ALT: 29 IU/L (ref 0–32)
AST: 19 IU/L (ref 0–40)
Albumin/Globulin Ratio: 2.1 (ref 1.2–2.2)
BUN / CREAT RATIO: 26 (ref 12–28)
BUN: 21 mg/dL (ref 8–27)
Bilirubin Total: 1.3 mg/dL — ABNORMAL HIGH (ref 0.0–1.2)
CHLORIDE: 101 mmol/L (ref 96–106)
CO2: 24 mmol/L (ref 20–29)
Calcium: 9.7 mg/dL (ref 8.7–10.3)
Creatinine, Ser: 0.82 mg/dL (ref 0.57–1.00)
GFR calc Af Amer: 87 mL/min/{1.73_m2} (ref >59)
GFR calc non Af Amer: 75 mL/min/{1.73_m2} (ref >59)
GLOBULIN, TOTAL: 2.2 g/dL (ref 1.5–4.5)
GLUCOSE: 155 mg/dL — AB (ref 65–99)
Potassium: 4.7 mmol/L (ref 3.5–5.2)
SODIUM: 140 mmol/L (ref 134–144)
Total Protein: 6.8 g/dL (ref 6.0–8.5)

## 2017-04-01 LAB — LIPID PANEL
Chol/HDL Ratio: 4.8 ratio — ABNORMAL HIGH (ref 0.0–4.4)
Cholesterol, Total: 120 mg/dL (ref 100–199)
HDL: 25 mg/dL — ABNORMAL LOW (ref >39)
LDL Calculated: 51 mg/dL (ref 0–99)
Triglycerides: 218 mg/dL — ABNORMAL HIGH (ref 0–149)
VLDL Cholesterol Cal: 44 mg/dL — ABNORMAL HIGH (ref 5–40)

## 2017-04-01 LAB — MICROALBUMIN / CREATININE URINE RATIO
Creatinine, Urine: 81.5 mg/dL
Microalb/Creat Ratio: 74 mg/g creat — ABNORMAL HIGH (ref 0.0–30.0)
Microalbumin, Urine: 60.3 ug/mL

## 2017-04-04 DIAGNOSIS — R69 Illness, unspecified: Secondary | ICD-10-CM | POA: Diagnosis not present

## 2017-04-05 ENCOUNTER — Other Ambulatory Visit: Payer: Self-pay | Admitting: Family Medicine

## 2017-04-06 ENCOUNTER — Encounter: Payer: Self-pay | Admitting: Family Medicine

## 2017-04-06 ENCOUNTER — Ambulatory Visit (INDEPENDENT_AMBULATORY_CARE_PROVIDER_SITE_OTHER): Payer: Medicare HMO | Admitting: Family Medicine

## 2017-04-06 VITALS — BP 154/75 | HR 89 | Temp 97.0°F | Ht 65.0 in | Wt 172.0 lb

## 2017-04-06 DIAGNOSIS — R0683 Snoring: Secondary | ICD-10-CM | POA: Diagnosis not present

## 2017-04-06 DIAGNOSIS — G479 Sleep disorder, unspecified: Secondary | ICD-10-CM

## 2017-04-06 DIAGNOSIS — G2581 Restless legs syndrome: Secondary | ICD-10-CM

## 2017-04-06 NOTE — Patient Instructions (Signed)
I am referring you to the sleep specialist to further evaluate why you are having difficulty with sleeping.  Restless legs can indeed cause difficulty with sleeping.  You may simply need to be put on a different medication.  However, you also told me that you are having some symptoms (i.e. Snoring) that are suggestive of obstructive sleep apnea.  If they find it clinically appropriate, they will do a sleep study on you.  Pending these results, we will determine what type of treatment that you need.  Call me if you do not hear within the next week for an appointment.   Sleep Apnea Sleep apnea is a condition in which breathing pauses or becomes shallow during sleep. Episodes of sleep apnea usually last 10 seconds or longer, and they may occur as many as 20 times an hour. Sleep apnea disrupts your sleep and keeps your body from getting the rest that it needs. This condition can increase your risk of certain health problems, including:  Heart attack.  Stroke.  Obesity.  Diabetes.  Heart failure.  Irregular heartbeat.  There are three kinds of sleep apnea:  Obstructive sleep apnea. This kind is caused by a blocked or collapsed airway.  Central sleep apnea. This kind happens when the part of the brain that controls breathing does not send the correct signals to the muscles that control breathing.  Mixed sleep apnea. This is a combination of obstructive and central sleep apnea.  What are the causes? The most common cause of this condition is a collapsed or blocked airway. An airway can collapse or become blocked if:  Your throat muscles are abnormally relaxed.  Your tongue and tonsils are larger than normal.  You are overweight.  Your airway is smaller than normal.  What increases the risk? This condition is more likely to develop in people who:  Are overweight.  Smoke.  Have a smaller than normal airway.  Are elderly.  Are female.  Drink alcohol.  Take sedatives or  tranquilizers.  Have a family history of sleep apnea.  What are the signs or symptoms? Symptoms of this condition include:  Trouble staying asleep.  Daytime sleepiness and tiredness.  Irritability.  Loud snoring.  Morning headaches.  Trouble concentrating.  Forgetfulness.  Decreased interest in sex.  Unexplained sleepiness.  Mood swings.  Personality changes.  Feelings of depression.  Waking up often during the night to urinate.  Dry mouth.  Sore throat.  How is this diagnosed? This condition may be diagnosed with:  A medical history.  A physical exam.  A series of tests that are done while you are sleeping (sleep study). These tests are usually done in a sleep lab, but they may also be done at home.  How is this treated? Treatment for this condition aims to restore normal breathing and to ease symptoms during sleep. It may involve managing health issues that can affect breathing, such as high blood pressure or obesity. Treatment may include:  Sleeping on your side.  Using a decongestant if you have nasal congestion.  Avoiding the use of depressants, including alcohol, sedatives, and narcotics.  Losing weight if you are overweight.  Making changes to your diet.  Quitting smoking.  Using a device to open your airway while you sleep, such as: ? An oral appliance. This is a custom-made mouthpiece that shifts your lower jaw forward. ? A continuous positive airway pressure (CPAP) device. This device delivers oxygen to your airway through a mask. ? A nasal expiratory  positive airway pressure (EPAP) device. This device has valves that you put into each nostril. ? A bi-level positive airway pressure (BPAP) device. This device delivers oxygen to your airway through a mask.  Surgery if other treatments do not work. During surgery, excess tissue is removed to create a wider airway.  It is important to get treatment for sleep apnea. Without treatment, this  condition can lead to:  High blood pressure.  Coronary artery disease.  (Men) An inability to achieve or maintain an erection (impotence).  Reduced thinking abilities.  Follow these instructions at home:  Make any lifestyle changes that your health care provider recommends.  Eat a healthy, well-balanced diet.  Take over-the-counter and prescription medicines only as told by your health care provider.  Avoid using depressants, including alcohol, sedatives, and narcotics.  Take steps to lose weight if you are overweight.  If you were given a device to open your airway while you sleep, use it only as told by your health care provider.  Do not use any tobacco products, such as cigarettes, chewing tobacco, and e-cigarettes. If you need help quitting, ask your health care provider.  Keep all follow-up visits as told by your health care provider. This is important. Contact a health care provider if:  The device that you received to open your airway during sleep is uncomfortable or does not seem to be working.  Your symptoms do not improve.  Your symptoms get worse. Get help right away if:  You develop chest pain.  You develop shortness of breath.  You develop discomfort in your back, arms, or stomach.  You have trouble speaking.  You have weakness on one side of your body.  You have drooping in your face. These symptoms may represent a serious problem that is an emergency. Do not wait to see if the symptoms will go away. Get medical help right away. Call your local emergency services (911 in the [place] Do not drive yourself to the hospital. This information is not intended to replace advice given to you by your health care provider. Make sure you discuss any questions you have with your health care provider. Document Released: 01/03/2002 Document Revised: 09/09/2015 Document Reviewed: 10/23/2014 Elsevier Interactive Patient Education  [employer]2018 [employer]

## 2017-04-06 NOTE — Progress Notes (Signed)
Subjective: CC: Restless legs PCP: Claretta Fraise, MD NUU:[id number] Padmanabhan is a 67 y.o. female presenting to clinic today for:  1. Restless legs/sleep difficulty Patient was seen 1 week ago for restless leg.  Requip was discontinued at that time and Sinemet was added.  She follows up today and notes that she never started the Sinemet because this was intended for Parkinson's and she "does not have Parkinson's".  She notes that she use the trazodone for sleep for about 2 days before discontinuing as it "did not work".  She describes difficulty both falling asleep and staying asleep and notes that she gets on average 3-4 hours of sleep per night.  Denies caffeine intake.  No nocturia.  She notes that last night she actually did not even have restless leg.  Diagnosis of restless leg was greater than 3 years ago.  She notes that initially, she was prescribed Requip but this caused her to have problems with her eyes.  She has never been evaluated by sleep specialist.  She does report that her spouse notes she snores and she notes that she is woke up in the middle the night gasping for air.  Reports history of sleep apnea and her sister.  Additionally, she reports that she was previously prescribed Ambien CR by her previous primary care provider.  She notes that this did work well for her and she would be interested in restarting this medication if clinically appropriate.  ROS: Per HPI  No Known Allergies Past Medical History:  Diagnosis Date  . DDD (degenerative disc disease), lumbar   . GERD (gastroesophageal reflux disease)   . Hyperlipidemia   . Hypertension   . Urge incontinence of urine     Current Outpatient Medications:  .  aspirin 81 MG chewable tablet, Chew 81 mg by mouth 2 (two) times daily., Disp: , Rfl:  .  atorvastatin (LIPITOR) 40 MG tablet, Take 1 tablet (40 mg total) by mouth daily. For cholesterol, Disp: 90 tablet, Rfl: 3 .  Blood Glucose Monitoring Suppl (ONE TOUCH ULTRA 2)  w/Device KIT, 1 KIT BY DOES NOT APPLY ROUTE ONCE FOR 1 DOSE., Disp: , Rfl: 0 .  carbidopa-levodopa (SINEMET) 10-100 MG tablet, Take 1 tablet by mouth 3 (three) times daily., Disp: 270 tablet, Rfl: 0 .  diclofenac (VOLTAREN) 75 MG EC tablet, TAKE 1 TABLET BY MOUTH TWICE A DAY, Disp: 60 tablet, Rfl: 0 .  glucose blood test strip, Check blood glucose twice daily, Disp: 100 each, Rfl: 12 .  LANCETS ULTRA THIN MISC, Use to check blood sugar twice daily., Disp: 200 each, Rfl: 3 .  lisinopril (PRINIVIL,ZESTRIL) 20 MG tablet, Take 1 tablet (20 mg total) by mouth at bedtime., Disp: 90 tablet, Rfl: 1 .  metFORMIN (GLUCOPHAGE) 1000 MG tablet, Take 1 tablet (1,000 mg total) by mouth 2 (two) times daily with a meal., Disp: 180 tablet, Rfl: 1 .  traZODone (DESYREL) 150 MG tablet, Take 1 tablet (150 mg total) by mouth at bedtime., Disp: 90 tablet, Rfl: 0 .  TURMERIC PO, Take 1 capsule by mouth., Disp: , Rfl:  Social History   Socioeconomic History  . Marital status: Married    Spouse name: Not on file  . Number of children: Not on file  . Years of education: Not on file  . Highest education level: Not on file  Social Needs  . Financial resource strain: Not on file  . Food insecurity - worry: Not on file  . Food insecurity - inability:  Not on file  . Transportation needs - medical: Not on file  . Transportation needs - non-medical: Not on file  Occupational History  . Not on file  Tobacco Use  . Smoking status: Never Smoker  . Smokeless tobacco: Never Used  Substance and Sexual Activity  . Alcohol use: No  . Drug use: No  . Sexual activity: Not on file  Other Topics Concern  . Not on file  Social History Narrative  . Not on file   Family History  Problem Relation Age of Onset  . Cancer Mother        lung cancer  . Cancer Father        lymphnodes  . Diabetes Sister   . Diabetes Brother   . Diabetes Son   . Cancer Maternal Aunt        throat  . Cancer Paternal Uncle        unknown   .  Cancer Maternal Grandfather   . Heart disease Paternal Grandfather   . Cancer Other        breast    Objective: Office vital signs reviewed. BP (!) 154/75   Pulse 89   Temp (!) 97 F (36.1 C) (Oral)   Ht 5' 5"  (1.651 m)   Wt 172 lb (78 kg)   BMI 28.62 kg/m   Physical Examination:  General: Awake, alert, well nourished, No acute distress HEENT: Normal    Neck: No masses palpated. No lymphadenopathy; Neck Girth 16.5 inches.    Eyes: PERRLA, extraocular membranes intact, sclera white    Throat: moist mucus membranes, no erythema, no tonsillar enlargement noted.  Airway is patent Cardio: regular rate and rhythm, S1S2 heard, no murmurs appreciated Pulm: clear to auscultation bilaterally, no wheezes, rhonchi or rales; normal work of breathing on room air  Epworth Sleepiness Scale:   STOP- BANG: Score of 7.  Assessment/ Plan: 67 y.o. female   1. Sleeping difficulty Possibly related to restless leg syndrome versus undiagnosed OSA.  She had a stop bang score of 7, given her high probability of undiagnosed OSA.  Her Epworth sleepiness score was 3.  I referred her to sleep medicine for further evaluation.  She would likely benefit from a sleep study.  Would greatly appreciate ruling out other possibilities as well.  Will make treatment plan pending her evaluation with the sleep medicine.  She is unresponsive to Trazodone 195m QHS prescribed by previous PCP.  I did discuss with the patient that benzodiazepines and medications like Ambien may not be a suitable treatment should she actually have an underlying obstructive sleep apnea as this can increase her risk for adverse events, particularly given age.  She was good understanding and is willing to be evaluated.  If obstructive sleep apnea is ruled out and her lack of sleep is not secondary to restless leg, could consider starting Ambien.  We discussed the risks of this medication given her age.  She voiced good understanding and will  follow-up after her evaluation with sleep medicine. - Ambulatory referral to Sleep Studies  2. Snoring Evaluate for OSA as above. - Ambulatory referral to Sleep Studies  3. Restless leg syndrome, familial, uncontrolled Not willing to take Sinemet and did not respond well to Requip.  We discussed that we could consider using Mirapex.  However, I will await sleep medicines formal evaluation of patient to determine if this medication is truly needed or if she actually has a different underlying sleeping disorder.  Treatment plan pending  their assessment. - Ambulatory referral to Sleep Studies   Orders Placed This Encounter  Procedures  . Ambulatory referral to Sleep Studies    Referral Priority:   Routine    Referral Type:   Consultation    Referral Reason:   Specialty Services Required    Number of Visits Requested:   [place], [place] [phone]

## 2017-04-09 DIAGNOSIS — E119 Type 2 diabetes mellitus without complications: Secondary | ICD-10-CM | POA: Diagnosis not present

## 2017-04-29 ENCOUNTER — Other Ambulatory Visit: Payer: Self-pay | Admitting: Family Medicine

## 2017-05-24 DIAGNOSIS — R69 Illness, unspecified: Secondary | ICD-10-CM | POA: Diagnosis not present

## 2017-05-27 ENCOUNTER — Ambulatory Visit: Payer: Medicare HMO | Admitting: Neurology

## 2017-05-27 ENCOUNTER — Encounter: Payer: Self-pay | Admitting: Neurology

## 2017-05-27 VITALS — BP 119/75 | HR 77 | Ht 65.0 in | Wt 169.0 lb

## 2017-05-27 DIAGNOSIS — R351 Nocturia: Secondary | ICD-10-CM

## 2017-05-27 DIAGNOSIS — G4761 Periodic limb movement disorder: Secondary | ICD-10-CM

## 2017-05-27 DIAGNOSIS — R0683 Snoring: Secondary | ICD-10-CM | POA: Diagnosis not present

## 2017-05-27 DIAGNOSIS — Z862 Personal history of diseases of the blood and blood-forming organs and certain disorders involving the immune mechanism: Secondary | ICD-10-CM

## 2017-05-27 DIAGNOSIS — Z820 Family history of epilepsy and other diseases of the nervous system: Secondary | ICD-10-CM

## 2017-05-27 DIAGNOSIS — G479 Sleep disorder, unspecified: Secondary | ICD-10-CM

## 2017-05-27 DIAGNOSIS — G2581 Restless legs syndrome: Secondary | ICD-10-CM

## 2017-05-27 NOTE — Progress Notes (Signed)
Subjective:    Patient ID: Candace Lopez is a 67 y.o. female.  HPI     Star Age, MD, PhD [facility] [address], [address] [place], [place] [address]  Dear Dr. Lemar Lofty,   I saw your patient, Candace Lopez, upon your kind request in my neurologic clinic today for initial consultation of her sleep disorder, in particular, concern for underlying obstructive sleep apnea and a prior diagnosis of restless leg syndrome. The patient is accompanied by her husband today. As you know, Candace Lopez is a 67 year old right-handed woman with an underlying medical history of hypertension, hyperlipidemia, urge incontinence, reflux disease, degenerative disc disease of the lumbar spine, and overweight state, who reports a history of restless leg syndrome for at least 30 years. She has a strong family history of restless leg syndrome including paternal grandmother, mother, sister, and her son has also recently reported symptoms to her. She has tried and failed several medications including gabapentin which was tried last year but did not help, she tried ropinirole for a few days in February of this year but had significant insomnia from it. She does report that it helped calm down her legs but she just could not sleep. She was then given a prescription for Sinemet in March 2019 and tried it again for a few days but had similar side effects of insomnia with it, she also reports that it helped her leg symptoms but again, she could not sleep at all. She also reports difficulty initiating and maintaining sleep. She has an Epworth sleepiness score of 10 out of 24, fatigue score is 37 out of 63. I reviewed your office note from 04/06/2017. She does have a history of iron deficiency anemia years ago and was treated with iron supplements at the time, this was around or right before her hysterectomy as I understand. She has a bedtime of around 10 PM, rise time between 5 and 6. She is retired,  she lives with her husband, she has one daughter and one son. She has nocturia between 1 or 3 times per night on average, denies morning headaches, she does make abnormal sounds during her sleep including snorting sound and she snores per husband's report. Her sister has sleep apnea and uses a CPAP machine. She would be willing to consider treatment for sleep apnea if the need arises. She stopped gabapentin in December 2018. She is a nonsmoker and does not utilize alcohol, she drinks caffeine rarely.  Her Past Medical History Is Significant For: Past Medical History:  Diagnosis Date  . DDD (degenerative disc disease), lumbar   . GERD (gastroesophageal reflux disease)   . Hyperlipidemia   . Hypertension   . Urge incontinence of urine     Her Past Surgical History Is Significant For: Past Surgical History:  Procedure Laterality Date  . ABDOMINAL HYSTERECTOMY    . arm surgery    . FOOT SURGERY    . KNEE SURGERY      Her Family History Is Significant For: Family History  Problem Relation Age of Onset  . Cancer Mother        lung cancer  . Cancer Father        lymphnodes  . Diabetes Sister   . Diabetes Brother   . Diabetes Son   . Cancer Maternal Aunt        throat  . Cancer Paternal Uncle        unknown   . Cancer Maternal Grandfather   .  Heart disease Paternal Grandfather   . Cancer Other        breast    Her Social History Is Significant For: Social History   Socioeconomic History  . Marital status: Married    Spouse name: Not on file  . Number of children: Not on file  . Years of education: Not on file  . Highest education level: Not on file  Occupational History  . Not on file  Social Needs  . Financial resource strain: Not on file  . Food insecurity:    Worry: Not on file    Inability: Not on file  . Transportation needs:    Medical: Not on file    Non-medical: Not on file  Tobacco Use  . Smoking status: Never Smoker  . Smokeless tobacco: Never Used   Substance and Sexual Activity  . Alcohol use: No  . Drug use: No  . Sexual activity: Not on file  Lifestyle  . Physical activity:    Days per week: Not on file    Minutes per session: Not on file  . Stress: Not on file  Relationships  . Social connections:    Talks on phone: Not on file    Gets together: Not on file    Attends religious service: Not on file    Active member of club or organization: Not on file    Attends meetings of clubs or organizations: Not on file    Relationship status: Not on file  Other Topics Concern  . Not on file  Social History Narrative  . Not on file    Her Allergies Are:  No Known Allergies:   Her Current Medications Are:  Outpatient Encounter Medications as of 05/27/2017  Medication Sig  . aspirin 81 MG chewable tablet Chew 81 mg by mouth 2 (two) times daily.  Marland Kitchen atorvastatin (LIPITOR) 40 MG tablet Take 1 tablet (40 mg total) by mouth daily. For cholesterol  . Blood Glucose Monitoring Suppl (ONE TOUCH ULTRA 2) w/Device KIT 1 KIT BY DOES NOT APPLY ROUTE ONCE FOR 1 DOSE.  Marland Kitchen diclofenac (VOLTAREN) 75 MG EC tablet TAKE 1 TABLET BY MOUTH TWICE A DAY  . glucose blood test strip Check blood glucose twice daily  . LANCETS ULTRA THIN MISC Use to check blood sugar twice daily.  Marland Kitchen lisinopril (PRINIVIL,ZESTRIL) 20 MG tablet Take 1 tablet (20 mg total) by mouth at bedtime.  . metFORMIN (GLUCOPHAGE) 1000 MG tablet Take 1 tablet (1,000 mg total) by mouth 2 (two) times daily with a meal.  . TURMERIC PO Take 1 capsule by mouth.   No facility-administered encounter medications on file as of 05/27/2017.   :  Review of Systems:  Out of a complete 14 point review of systems, all are reviewed and negative with the exception of these symptoms as listed below: Review of Systems  Neurological:       Patient reports that her restless legs cause her to have difficulty getting to sleep.   Epworth Sleepiness Scale 0= would never doze 1= slight chance of dozing 2=  moderate chance of dozing 3= high chance of dozing  Sitting and reading:1 Watching TV:1 Sitting inactive in a public place (ex. Theater or meeting):3 As a passenger in a car for an hour without a break:3 Lying down to rest in the afternoon:1 Sitting and talking to someone:0 Sitting quietly after lunch (no alcohol): 1 In a car, while stopped in traffic:0 Total:10     Objective:  Neurological Exam  Physical Exam Physical Examination:   Vitals:   05/27/17 1046  BP: 119/75  Pulse: 77    General Examination: The patient is a very pleasant 67 y.o. female in no acute distress. She appears well-developed and well-nourished and well groomed.   HEENT: Normocephalic, atraumatic, pupils are equal, round and reactive to light and accommodation. Extraocular tracking is good without limitation to gaze excursion or nystagmus noted. Normal smooth pursuit is noted. Hearing is grossly intact. Face is symmetric with normal facial animation and normal facial sensation. Speech is clear with no dysarthria noted. There is no hypophonia. There is no lip, neck/head, jaw or voice tremor. Neck is supple with full range of passive and active motion. There are no carotid bruits on auscultation. Oropharynx exam reveals: mild mouth dryness, adequate dental hygiene and moderate airway crowding, due to Small airway entry and redundant soft palate, tonsils are small, Mallampati is class III, neck circumference is 15-5/8 inches. Tongue protrudes centrally and palate elevates symmetrically.   Chest: Clear to auscultation without wheezing, rhonchi or crackles noted.  Heart: S1+S2+0, regular and normal without murmurs, rubs or gallops noted.   Abdomen: Soft, non-tender and non-distended with normal bowel sounds appreciated on auscultation.  Extremities: There is no pitting edema in the distal lower extremities bilaterally. Pedal pulses are intact.  Skin: Warm and dry without trophic changes  noted.  Musculoskeletal: exam reveals no obvious joint deformities, tenderness or joint swelling or erythema.   Neurologically:  Mental status: The patient is awake, alert and oriented in all 4 spheres. Her immediate and remote memory, attention, language skills and fund of knowledge are appropriate. There is no evidence of aphasia, agnosia, apraxia or anomia. Speech is clear with normal prosody and enunciation. Thought process is linear. Mood is normal and affect is normal.  Cranial nerves II - XII are as described above under HEENT exam. In addition: shoulder shrug is normal with equal shoulder height noted. Motor exam: Normal bulk, strength and tone is noted. There is no drift, tremor or rebound. Romberg is negative. Reflexes are about 1+. Fine motor skills and coordination: intact grossly.  Cerebellar testing: No dysmetria or intention tremor on finger to nose testing. There is no truncal or gait ataxia.  Sensory exam: intact to light touch in the upper and lower extremities.  Gait, station and balance: She stands easily. No veering to one side is noted. No leaning to one side is noted. Posture is age-appropriate and stance is narrow based. Gait shows normal stride length and normal pace. No problems turning are noted. Tandem walk is difficult for her.   Assessment and Plan:   In summary, Candace Lopez is a very pleasant 67 y.o.-year old female ith an underlying medical history of hypertension, hyperlipidemia, urge incontinence, reflux disease, degenerative disc disease of the lumbar spine, and overweight state, who presents for sleep consultation. She reports a long-standing history of restless leg syndrome as well as a very strong family history of it. She has tried and failed 3 different medications thus far including gabapentin, Requip, and Sinemet. She had side effects with the last 2 medications and gabapentin was not effective even on higher dose. She reports difficulty initiating and  maintaining sleep. She also snores and has daytime tiredness. I would like to proceed with a sleep study. I would like to rule out sleep apnea, as treating sleep apnea with CPAP can improve restless leg symptoms and leg movements at night. In addition, I would like to do blood work to  rule out iron deficiency or frank anemia. Iron deficiency has been associated with restless leg syndrome. She is agreeable with the plan. We will call her with her blood test results and I will see her back after sleep studies completed. She would be willing to try CPAP therapy for need arises. She has a family history of sleep apnea as well. I answered all their questions today and the patient and her husband were in agreement.   Thank you very much for allowing me to participate in the care of this nice patient. If I can be of any further assistance to you please do not hesitate to call me at [phone].  Sincerely,   Star Age, MD, PhD

## 2017-05-27 NOTE — Patient Instructions (Signed)
We will check blood work today and call you with the test results. We will check for iron deficiency or anemia.  We will do a sleep study to rule out sleep apnea.  If you have sleep apnea, I would like for you to try CPAP.

## 2017-05-28 ENCOUNTER — Telehealth: Payer: Self-pay

## 2017-05-28 LAB — IRON AND TIBC
IRON: 78 ug/dL (ref 27–139)
Iron Saturation: 27 % (ref 15–55)
TIBC: 286 ug/dL (ref 250–450)
UIBC: 208 ug/dL (ref 118–369)

## 2017-05-28 LAB — FERRITIN: FERRITIN: 244 ng/mL — AB (ref 15–150)

## 2017-05-28 LAB — CBC WITH DIFFERENTIAL/PLATELET
BASOS ABS: 0 10*3/uL (ref 0.0–0.2)
Basos: 0 %
EOS (ABSOLUTE): 0.1 10*3/uL (ref 0.0–0.4)
Eos: 2 %
HEMOGLOBIN: 13.6 g/dL (ref 11.1–15.9)
Hematocrit: 39.7 % (ref 34.0–46.6)
IMMATURE GRANS (ABS): 0 10*3/uL (ref 0.0–0.1)
Immature Granulocytes: 0 %
LYMPHS: 24 %
Lymphocytes Absolute: 1.4 10*3/uL (ref 0.7–3.1)
MCH: 31.3 pg (ref 26.6–33.0)
MCHC: 34.3 g/dL (ref 31.5–35.7)
MCV: 92 fL (ref 79–97)
MONOCYTES: 6 %
Monocytes Absolute: 0.4 10*3/uL (ref 0.1–0.9)
NEUTROS ABS: 3.8 10*3/uL (ref 1.4–7.0)
Neutrophils: 68 %
Platelets: 254 10*3/uL (ref 150–379)
RBC: 4.34 x10E6/uL (ref 3.77–5.28)
RDW: 13.6 % (ref 12.3–15.4)
WBC: 5.7 10*3/uL (ref 3.4–10.8)

## 2017-05-28 LAB — TSH: TSH: 2.06 u[IU]/mL (ref 0.450–4.500)

## 2017-05-28 NOTE — Telephone Encounter (Signed)
I called pt, an unidentified female answered the phone, advised me that pt was not available and to call back in a couple hours. Will try again later.

## 2017-05-28 NOTE — Telephone Encounter (Signed)
-----   Message from Huston Foley, MD sent at 05/28/2017  7:33 AM EDT ----- Labs normal. Ferritin (storage iron protein) is high, which is per se not a problem, but can be up in the context of an acute infection; the white count does not suggest infection, so as of right now, nothing else to do. Please notify pt and we will proceed, as discussed, with PSG.  sa

## 2017-05-28 NOTE — Telephone Encounter (Signed)
I called pt, explained her lab results. Pt reports that she used to take an iron supplement but stopped taking it 2-3 weeks ago. Pt will proceed with PSG as discussed. Pt verbalized understanding of results. Pt had no questions at this time but was encouraged to call back if questions arise.

## 2017-05-28 NOTE — Progress Notes (Signed)
Labs normal. Ferritin (storage iron protein) is high, which is per se not a problem, but can be up in the context of an acute infection; the white count does not suggest infection, so as of right now, nothing else to do. Please notify pt and we will proceed, as discussed, with PSG.  sa

## 2017-06-03 ENCOUNTER — Telehealth: Payer: Self-pay

## 2017-06-03 DIAGNOSIS — R0683 Snoring: Secondary | ICD-10-CM

## 2017-06-03 DIAGNOSIS — G2581 Restless legs syndrome: Secondary | ICD-10-CM

## 2017-06-03 NOTE — Telephone Encounter (Signed)
HST order placed. 

## 2017-06-03 NOTE — Telephone Encounter (Signed)
[employer] denied in lab sleep study even with restless leg. Need HST order

## 2017-06-15 ENCOUNTER — Ambulatory Visit (INDEPENDENT_AMBULATORY_CARE_PROVIDER_SITE_OTHER): Payer: Medicare HMO | Admitting: Neurology

## 2017-06-15 DIAGNOSIS — G2581 Restless legs syndrome: Secondary | ICD-10-CM

## 2017-06-15 DIAGNOSIS — G4733 Obstructive sleep apnea (adult) (pediatric): Secondary | ICD-10-CM | POA: Diagnosis not present

## 2017-06-15 DIAGNOSIS — R0683 Snoring: Secondary | ICD-10-CM

## 2017-06-20 ENCOUNTER — Other Ambulatory Visit: Payer: Self-pay | Admitting: Family Medicine

## 2017-06-24 ENCOUNTER — Telehealth: Payer: Self-pay | Admitting: Neurology

## 2017-06-24 DIAGNOSIS — G4733 Obstructive sleep apnea (adult) (pediatric): Secondary | ICD-10-CM

## 2017-06-24 DIAGNOSIS — Z9989 Dependence on other enabling machines and devices: Secondary | ICD-10-CM

## 2017-06-24 NOTE — Procedures (Signed)
[facility] [address] [place], [place] [address] NAME: Candace Lopez                                                                DOB: [birth year] MEDICAL RECORD NUMBER [id number]                                             DOS:  06/15/17 REFERRING PHYSICIAN: Delynn Flavin, DO STUDY PERFORMED: Home Sleep Test HISTORY: 67 year old woman with a history of hypertension, hyperlipidemia, urge incontinence, reflux disease, degenerative disc disease of the lumbar spine, and overweight state, who reports restless leg symptoms and initiating and maintaining sleep. She has an Epworth sleepiness score of 10 out of 24, BMI of 28.1.   STUDY RESULTS:  Total Recording Time (valid test time):  7 hours, 7 minutes Total Apnea/Hypopnea Index (AHI): 7.7/h, RDI: 10.7/h Average Oxygen Saturation: 91%, Lowest Oxygen Desaturation: 74%  Total Time Oxygen Saturation Below or at 88%:  25 minutes  Average Heart Rate: 69 bpm (between 57 and 120 bpm) IMPRESSION: OSA  RECOMMENDATION: This home sleep test demonstrates overall mild obstructive sleep apnea with a total AHI of 7.7/hour and O2 nadir of 74%. Given the patient's medical history and sleep related complaints, treatment with positive airway pressure is recommended. This can be achieved in the form of autoPAP trial/titration at home. A full night CPAP titration study will help with proper treatment settings and mask fitting if needed, if needed down the road. Alternative treatments include weight loss along with avoidance of the supine sleep position, or an oral appliance in appropriate candidates. Please note that untreated obstructive sleep apnea may carry additional perioperative morbidity. Patients with significant obstructive sleep apnea should receive perioperative PAP therapy and the surgeons and particularly the anesthesiologist should be informed of the diagnosis and the severity of the sleep disordered breathing. The patient  should be cautioned not to drive, work at heights, or operate dangerous or heavy equipment when tired or sleepy. Review and reiteration of good sleep hygiene measures should be pursued with any patient. Other causes of the patient's symptoms, including circadian rhythm disturbances, an underlying mood disorder, medication effect and/or an underlying medical problem cannot be ruled out based on this test. Clinical correlation is recommended. The patient and his referring provider will be notified of the test results. The patient will be seen in follow up in sleep clinic at [facility]. I certify that I have reviewed the raw data recording prior to the issuance of this report in accordance with the standards of the American Academy of Sleep Medicine (AASM).  Huston Foley, MD, PhD [facility]) Diplomat, ABPN ([facility])

## 2017-06-24 NOTE — Telephone Encounter (Signed)
I called Candace Lopez. I advised Candace Lopez that Dr. Frances Furbish reviewed their sleep study results and found that Candace Lopez has mild sleep apnea. Dr. Frances Furbish recommends that Candace Lopez starts a auto CPAP 5-12 cm water pressure. I reviewed PAP compliance expectations with the Candace Lopez. Candace Lopez is agreeable to starting a CPAP. I advised Candace Lopez that an order will be sent to a DME, [facility], and [facility] will call the Candace Lopez within about one week after they file with the Candace Lopez's insurance. [facility] will show the Candace Lopez how to use the machine, fit for masks, and troubleshoot the CPAP if needed. A follow up appt was made for insurance purposes with Dr. Frances Furbish on Sep 01, 2017 at 10:30 am. Candace Lopez verbalized understanding to arrive 15 minutes early and bring their CPAP. A letter with all of this information in it will be mailed to the Candace Lopez as a reminder. I verified with the Candace Lopez that the address we have on file is correct. Candace Lopez verbalized understanding of results. Candace Lopez had no questions at this time but was encouraged to call back if questions arise.

## 2017-06-24 NOTE — Telephone Encounter (Signed)
-----   Message from Huston Foley, MD sent at 06/24/2017  8:11 AM EDT ----- Patient referred by Dr. Nadine Counts, seen by me on 05/27/17, HST on 06/15/17.    Please call and notify the patient that the recent home sleep test showed obstructive sleep apnea. OSA is overall mild, but worth treating to see if she feels better after treatment; she may note an improvement in her leg twitching and RLS symptoms. To that end I recommend treatment for this in the form of autoPAP, which means, that we don't have to bring her in for a sleep study with CPAP, but will let her try an autoPAP machine at home, through a DME company (of her choice, or as per insurance requirement). The DME representative will educate her on how to use the machine, how to put the mask on, etc. I have placed an order in the chart. Please send referral, talk to patient, send report to referring MD. We will need a FU in sleep clinic for 10 weeks post-PAP set up, please arrange that with me or one of our NPs. Thanks,   Huston Foley, MD, PhD [facility])

## 2017-06-24 NOTE — Addendum Note (Signed)
Addended by: Huston Foley on: 06/24/2017 08:11 AM   Modules accepted: Orders

## 2017-06-24 NOTE — Progress Notes (Signed)
Patient referred by Dr. Nadine Counts, seen by me on 05/27/17, HST on 06/15/17.    Please call and notify the patient that the recent home sleep test showed obstructive sleep apnea. OSA is overall mild, but worth treating to see if she feels better after treatment; she may note an improvement in her leg twitching and RLS symptoms. To that end I recommend treatment for this in the form of autoPAP, which means, that we don't have to bring her in for a sleep study with CPAP, but will let her try an autoPAP machine at home, through a DME company (of her choice, or as per insurance requirement). The DME representative will educate her on how to use the machine, how to put the mask on, etc. I have placed an order in the chart. Please send referral, talk to patient, send report to referring MD. We will need a FU in sleep clinic for 10 weeks post-PAP set up, please arrange that with me or one of our NPs. Thanks,   Huston Foley, MD, PhD [facility])

## 2017-06-28 ENCOUNTER — Other Ambulatory Visit: Payer: Self-pay | Admitting: Family Medicine

## 2017-06-30 ENCOUNTER — Ambulatory Visit: Payer: Medicare HMO | Admitting: Family Medicine

## 2017-06-30 ENCOUNTER — Ambulatory Visit (INDEPENDENT_AMBULATORY_CARE_PROVIDER_SITE_OTHER): Payer: Medicare HMO | Admitting: Family Medicine

## 2017-06-30 ENCOUNTER — Encounter: Payer: Self-pay | Admitting: Family Medicine

## 2017-06-30 VITALS — BP 138/81 | HR 84 | Temp 97.3°F | Ht 65.0 in | Wt 169.0 lb

## 2017-06-30 DIAGNOSIS — I10 Essential (primary) hypertension: Secondary | ICD-10-CM | POA: Diagnosis not present

## 2017-06-30 DIAGNOSIS — E1169 Type 2 diabetes mellitus with other specified complication: Secondary | ICD-10-CM | POA: Diagnosis not present

## 2017-06-30 DIAGNOSIS — I152 Hypertension secondary to endocrine disorders: Secondary | ICD-10-CM

## 2017-06-30 DIAGNOSIS — G4733 Obstructive sleep apnea (adult) (pediatric): Secondary | ICD-10-CM | POA: Diagnosis not present

## 2017-06-30 DIAGNOSIS — E1140 Type 2 diabetes mellitus with diabetic neuropathy, unspecified: Secondary | ICD-10-CM | POA: Diagnosis not present

## 2017-06-30 DIAGNOSIS — IMO0002 Reserved for concepts with insufficient information to code with codable children: Secondary | ICD-10-CM | POA: Insufficient documentation

## 2017-06-30 DIAGNOSIS — E1159 Type 2 diabetes mellitus with other circulatory complications: Secondary | ICD-10-CM | POA: Diagnosis not present

## 2017-06-30 DIAGNOSIS — E785 Hyperlipidemia, unspecified: Secondary | ICD-10-CM | POA: Diagnosis not present

## 2017-06-30 LAB — BAYER DCA HB A1C WAIVED: HB A1C (BAYER DCA - WAIVED): 7 % — ABNORMAL HIGH (ref <7.0)

## 2017-06-30 NOTE — Patient Instructions (Signed)
Follow up   Get your diabetic eye exam done (looks for diabetic retinopathy) and have results sent to our office.  Follow up in 3 months for Diabetes or sooner if needed.   Diabetic Retinopathy Diabetic retinopathy is a disease of the light-sensitive membrane at the back of the eye (retina). It is a complication of diabetes and a common cause of blindness. Early detection of the disease is key to keeping your eyes healthy. What are the causes? Diabetic retinopathy is caused by blood sugar (glucose) levels that are too high over an extended period of time. High blood sugars cause damage to the small blood vessels of the retina, allowing blood to leak through the vessel walls. This causes visual impairment and eventually blindness. What increases the risk?  High blood pressure.  Having diabetes for a long time.  Having poorly controlled blood sugars. What are the signs or symptoms? In the early stages of diabetic retinopathy, there are often no symptoms. As the condition advances, symptoms may include:  Blurred vision. This is usually caused by a swelling due to abnormal blood glucose levels. The blurriness may go away when blood glucose levels return to normal.  Moving specks or dark spots (floaters) in your vision. These can be caused by a small retinal hemorrhage. A hemorrhage is bleeding from blood vessels.  Missing parts of your field of vision, such as things at the side. This can be caused by larger retinal hemorrhages.  Difficulty reading books or signs.  Double vision.  Pain in one or both eyes.  Feeling pressure in one or both eyes.  Trouble seeing straight lines. Straight lines do not look straight.  Redness of the eyes that does not go away.  How is this diagnosed? Your eye care specialist can detect changes in the blood vessels of your eye by putting drops in your eyes that enlarge (dilate) your pupils. This allows your eye care specialist to get a good look at your  retina to see if there are any changes that have occurred as a result of your diabetes. You should have your eyes examined once a year. How is this treated? Your eye care specialist may use a special laser beam to seal the blood vessels of the retina and stop them from leaking. Early detection and treatment are important so that further damage to your eyes can be prevented. In addition, managing your blood sugars and keeping them in the target range can slow the progress of the disease. Follow these instructions at home:  Keep your blood pressure within your target range.  Keep your blood glucose levels within your target range.  Follow your health care provider's instructions regarding diet and other means for controlling your blood glucose levels.  Check your blood levels for glucose as recommended by your health care provider.  Keep regular appointments with your eye specialist. An eye specialist can usually see diabetic retinopathy developing long before it starts causing problems. In many cases, it can be treated to prevent complications from occurring. If you have diabetes, you should have your eyes checked at least every year. Your risk of retinopathy increases the longer you have the disease.  If you smoke, quit. Ask your health care provider for help if needed. Smoking can make retinopathy worse. Contact a health care provider if:  You notice gradual blurring or other changes in your vision over time.  You notice that your glasses or contact lenses do not make things look as sharp as they  once did.  You have trouble reading or seeing details at a distance with either eye.  You notice a sudden change in your vision or notice that parts of your field of vision appear missing or hazy.  You suddenly see moving specks or dark spots in the field of vision of either eye.  You have sudden partial loss of vision in either eye. This information is not intended to replace advice given to  you by your health care provider. Make sure you discuss any questions you have with your health care provider. Document Released: 01/11/2000 Document Revised: 06/21/2015 Document Reviewed: 07/05/2012 Elsevier Interactive Patient Education  2017 [employer].

## 2017-06-30 NOTE — Progress Notes (Signed)
Subjective: CC: 3 month f/u DM2, OSA PCP: Janora Norlander, DO OQH:UTMLYY Tenorio is a 67 y.o. female presenting to clinic today for:  1. Type 2 Diabetes/ HLD/ HTN:  Patient reports that she was diagnosed with diabetes almost 15 years ago.  She reports that she has worked on carbohydrate reduction in weight loss and went from 192 pounds to her present weight.  She reports compliance with: Metformin 1075m BID, Lisinopril 20, Lipitor 447m ASA 8183mSide effects: None.  She tolerates these medications well.  She reports that earlier in her disease, she did have polyuria and polydipsia but no longer has the symptoms.  No history of foot ulcerations.  She was told that she had neuropathy in her great toes previously.  She reports that this is chronic and intermittent.  Last eye exam: Greater than 1 year ago.  She sees MarChief Executive Officer KinVeniceShe plans to schedule appointment soon for diabetic eye exam but notes that she wants to hold off so that she can get her husband to his other appointments first.  No history of retinopathy per her report but she does note that she has bilateral small cataracts. Last foot exam: 03/2017 Last A1c: 7.6 in March Nephropathy screen indicated?: on ACE-I Last flu, zoster and/or pneumovax: PNA declined by patient  ROS: denies fever, chills, dizziness, LOC, polyuria, polydipsia, unintended weight loss/gain, foot ulcerations, numbness or tingling in extremities or chest pain.  2. OSA Patient seen 3 months ago for symptoms that seem consistent OSA versus restless leg.  She had previously failed Sinemet and Requip secondary to side effects.  She was referred to sleep medicine, who did perform a home sleep study which revealed mild obstructive sleep apnea.  She was prescribed AutoPap and was instructed to follow-up in August with Dr. AthRexene AlbertsPatient reports that she has not yet received insurance approval for AutoPap.  She is awaiting to start this.  She notes she continues  to have restless leg symptoms at nighttime.  She actually reports that she had complete resolution of the symptoms after taking 5 days worth of her mother's medications.  She is unsure as to what this medication was but will check in on it and call me with the name.   ROS: Per HPI  No Known Allergies Past Medical History:  Diagnosis Date  . DDD (degenerative disc disease), lumbar   . GERD (gastroesophageal reflux disease)   . Hyperlipidemia   . Hypertension   . Urge incontinence of urine     Current Outpatient Medications:  .  aspirin 81 MG chewable tablet, Chew 81 mg by mouth 2 (two) times daily., Disp: , Rfl:  .  atorvastatin (LIPITOR) 40 MG tablet, Take 1 tablet (40 mg total) by mouth daily. For cholesterol, Disp: 90 tablet, Rfl: 3 .  Blood Glucose Monitoring Suppl (ONE TOUCH ULTRA 2) w/Device KIT, 1 KIT BY DOES NOT APPLY ROUTE ONCE FOR 1 DOSE., Disp: , Rfl: 0 .  diclofenac (VOLTAREN) 75 MG EC tablet, TAKE 1 TABLET BY MOUTH TWICE A DAY, Disp: 60 tablet, Rfl: 0 .  glucose blood test strip, Check blood glucose twice daily, Disp: 100 each, Rfl: 12 .  LANCETS ULTRA THIN MISC, Use to check blood sugar twice daily., Disp: 200 each, Rfl: 3 .  lisinopril (PRINIVIL,ZESTRIL) 20 MG tablet, Take 1 tablet (20 mg total) by mouth at bedtime., Disp: 90 tablet, Rfl: 1 .  metFORMIN (GLUCOPHAGE) 1000 MG tablet, Take 1 tablet (1,000 mg total)  by mouth 2 (two) times daily with a meal., Disp: 180 tablet, Rfl: 1 .  TURMERIC PO, Take 1 capsule by mouth., Disp: , Rfl:  Social History   Socioeconomic History  . Marital status: Married    Spouse name: Not on file  . Number of children: Not on file  . Years of education: Not on file  . Highest education level: Not on file  Occupational History  . Not on file  Social Needs  . Financial resource strain: Not on file  . Food insecurity:    Worry: Not on file    Inability: Not on file  . Transportation needs:    Medical: Not on file    Non-medical: Not  on file  Tobacco Use  . Smoking status: Never Smoker  . Smokeless tobacco: Never Used  Substance and Sexual Activity  . Alcohol use: No  . Drug use: No  . Sexual activity: Not on file  Lifestyle  . Physical activity:    Days per week: Not on file    Minutes per session: Not on file  . Stress: Not on file  Relationships  . Social connections:    Talks on phone: Not on file    Gets together: Not on file    Attends religious service: Not on file    Active member of club or organization: Not on file    Attends meetings of clubs or organizations: Not on file    Relationship status: Not on file  . Intimate partner violence:    Fear of current or ex partner: Not on file    Emotionally abused: Not on file    Physically abused: Not on file    Forced sexual activity: Not on file  Other Topics Concern  . Not on file  Social History Narrative  . Not on file   Family History  Problem Relation Age of Onset  . Cancer Mother        lung cancer  . Cancer Father        lymphnodes  . Diabetes Sister   . Diabetes Brother   . Diabetes Son   . Cancer Maternal Aunt        throat  . Cancer Paternal Uncle        unknown   . Cancer Maternal Grandfather   . Heart disease Paternal Grandfather   . Cancer Other        breast    Objective: Office vital signs reviewed. BP 138/81   Pulse 84   Temp (!) 97.3 F (36.3 C) (Oral)   Ht _0  (1.651 m)   Wt 169 lb (76.7 kg)   BMI 28.12 kg/m   Physical Examination:  General: Awake, alert, well nourished, No acute distress HEENT: Normal    Eyes: PERRLA, extraocular membranes intact, sclera white    Throat: moist mucus membranes Cardio: regular rate and rhythm, S1S2 heard, no murmurs appreciated Pulm: clear to auscultation bilaterally, no wheezes, rhonchi or rales; normal work of breathing on room air Extremities: warm, well perfused, No edema, cyanosis or clubbing; +2 pulses bilaterally MSK: normal gait and normal station Skin: dry;  intact; no rashes or lesions  Assessment/ Plan: 67 y.o. female   1. Type 2 diabetes mellitus with diabetic neuropathy, without long-term current use of insulin ([place]) Well-controlled.  Today's A1c was 7.0.  Continue current regimen.  She will schedule her diabetic eye exam with Dr. Suzan Slick and have records sent to our office.  May follow-up  in 6 months or sooner if needed. - Bayer DCA Hb A1c Waived  2. Hypertension associated with diabetes ([place]) At goal for age but given other comorbidities, would favor her blood pressure being slightly tighter.  We will continue to monitor and if needed will increase Lisinopril.  3. Hyperlipidemia associated with type 2 diabetes mellitus ([place]) Continue statin.  4. Mild obstructive sleep apnea Will reach out to Dr Guadelupe Sabin office to see if we can check in on this.  Ms. Bresnan and I had a detailed conversation on the importance of use of her AutoPap/CPAP machine to reduce risk of stroke and heart attack, particularly given other comorbidities.  We will plan to assist as able with insomnia.  I would like her to use the AutoPap for at least a month before starting any other medications for restless leg or insomnia.  I informed her that her symptoms would likely improve with use of the machine.  We discussed the risks of benzodiazepines and Ambien in the setting of sleep apnea.  Would prefer to avoid these types of medications if possible.  We discussed consideration for Mirapex for restless leg or trazodone for sleep if needed.  However, again would prefer to avoid these until she has had at least a month of AutoPap use.   Orders Placed This Encounter  Procedures  . Bayer DCA Hb A1c Waived    Patient to schedule initial Medicare visit.  Janora Norlander, DO [place] [phone]

## 2017-07-01 ENCOUNTER — Ambulatory Visit: Payer: Medicare HMO | Admitting: Family Medicine

## 2017-07-11 DIAGNOSIS — R69 Illness, unspecified: Secondary | ICD-10-CM | POA: Diagnosis not present

## 2017-07-13 DIAGNOSIS — G4733 Obstructive sleep apnea (adult) (pediatric): Secondary | ICD-10-CM | POA: Diagnosis not present

## 2017-07-20 DIAGNOSIS — Z1231 Encounter for screening mammogram for malignant neoplasm of breast: Secondary | ICD-10-CM | POA: Diagnosis not present

## 2017-07-20 LAB — HM MAMMOGRAPHY

## 2017-07-20 NOTE — Telephone Encounter (Signed)
Pt has called asking the RN calls her re: the difficulty she is having in breathing on the CPAP.  Pt is asking for a call to discuss increasing the pressure.  Please call pt at [phone]

## 2017-07-20 NOTE — Telephone Encounter (Signed)
Spoke with the patient. Discussed that both sleep doctors are out of the office at this time. RN discussed with a couple of physicians ([facility] [employer] [facility]) who agree that this is best to be addressed when Dr. Frances FurbishAthar returns. Pt verbalized understanding. She stated that she feels like there is not enough air/pressure and she has to open her mouth in addition to the nasal mask. She is able to sleep although it takes her awhile to fall asleep d/t RLS. Advised pt if any emergencies proceed to ED, for any further needs she is welcome to call our office back. If any issues with CPAP machine itself, please call [facility]. Otherwise we will be in contact upon their return to office. Pt verbalized understanding and appreciation.

## 2017-07-26 ENCOUNTER — Other Ambulatory Visit: Payer: Self-pay | Admitting: Family Medicine

## 2017-07-27 NOTE — Telephone Encounter (Signed)
The patient CPAP download shows that she used her machine 14 out of 30 days for compliance of 47%.  Use her machine greater than 4 hours each night.  On average she uses her machine 6 hours and 40 minutes.  She is currently on AutoSet with a pressure between 5 and 12 cm of water.  Her residual AHI is 1.1.  She does not have a significant leak with the machine.  Her average pressure in the 95th percentile is 11.5 cm of water.  I will change her settings 8-15 cm H20.  Please advise patient that if she continues to have a hard time breathing soon as she puts the mask on we can further adjust her starting pressure.  Please encourage the patient to use the CPAP nightly and greater than 4 hours each night.

## 2017-07-27 NOTE — Addendum Note (Signed)
Addended by: Enedina FinnerMILLIKAN, Bunny Kleist P on: 07/27/2017 11:48 AM   Modules accepted: Orders

## 2017-07-27 NOTE — Telephone Encounter (Signed)
I called pt and explained that Aundra MilletMegan, NP has agreed to increase her cpap pressure to 8-15 cm H2O. I reviewed compliance expectations with the pt. Pt is very thankful for the increased cpap pressure and understands that this order will be sent to [facility] today.

## 2017-07-27 NOTE — Telephone Encounter (Signed)
Patient calling back to discuss pressure in CPAP. She feels like there is still  not enough air for her to breathe. Should she not use her CPAP?

## 2017-07-27 NOTE — Telephone Encounter (Signed)
I called pt. She reports that she feels like she has to stay absolutely still and breathe very deeply to feel as if she is getting enough air from her cpap. I reiterated again with her that Dr. Frances FurbishAthar is out of the office, and the physicians that reviewed her messages last week think this is best addressed by Dr. Frances FurbishAthar. Pt asked if there is not any other doctors that can increase her cpap pressure. I explained that this is highly unusual, but all 3 sleep physicians are out of the office this week. I offered to let her speak with Robin, sleep lab manager, who could perhaps offer her tips on tolerating the machine better, until I could discuss an increase in cpap pressure next week with Dr. Frances FurbishAthar. Pt asks me why I can't just increase the pressure, and I explained that this order must come from an MD. I encouraged pt to keep using her machine but she says that she can't tolerate the pressure being so low. She asked me if she could switch sleep physician offices, and I explained this is a possibility, and usually the PCP would need to place a referral to another office. Pt says that it is "ridiculous" that we don't have a sleep physician here to cover these questions, and that she will speak to her PCP regarding a switch. However, pt told me that she still wants to discuss this with Dr. Frances FurbishAthar next week and I advised her that I will call her next week with Dr. Teofilo PodAthar's recommendations.

## 2017-08-12 DIAGNOSIS — G4733 Obstructive sleep apnea (adult) (pediatric): Secondary | ICD-10-CM | POA: Diagnosis not present

## 2017-08-26 ENCOUNTER — Encounter: Payer: Self-pay | Admitting: Family Medicine

## 2017-08-26 ENCOUNTER — Encounter (INDEPENDENT_AMBULATORY_CARE_PROVIDER_SITE_OTHER): Payer: Self-pay | Admitting: *Deleted

## 2017-08-26 ENCOUNTER — Ambulatory Visit (INDEPENDENT_AMBULATORY_CARE_PROVIDER_SITE_OTHER): Payer: Medicare HMO | Admitting: Family Medicine

## 2017-08-26 ENCOUNTER — Other Ambulatory Visit: Payer: Self-pay | Admitting: Family Medicine

## 2017-08-26 VITALS — BP 129/84 | HR 73 | Temp 97.9°F | Ht 65.0 in | Wt 172.0 lb

## 2017-08-26 DIAGNOSIS — Z1211 Encounter for screening for malignant neoplasm of colon: Secondary | ICD-10-CM | POA: Diagnosis not present

## 2017-08-26 DIAGNOSIS — R002 Palpitations: Secondary | ICD-10-CM

## 2017-08-26 DIAGNOSIS — G479 Sleep disorder, unspecified: Secondary | ICD-10-CM

## 2017-08-26 DIAGNOSIS — R011 Cardiac murmur, unspecified: Secondary | ICD-10-CM

## 2017-08-26 DIAGNOSIS — Z23 Encounter for immunization: Secondary | ICD-10-CM

## 2017-08-26 DIAGNOSIS — Z0000 Encounter for general adult medical examination without abnormal findings: Secondary | ICD-10-CM

## 2017-08-26 DIAGNOSIS — N6320 Unspecified lump in the left breast, unspecified quadrant: Secondary | ICD-10-CM

## 2017-08-26 DIAGNOSIS — E8940 Asymptomatic postprocedural ovarian failure: Secondary | ICD-10-CM

## 2017-08-26 MED ORDER — ZOLPIDEM TARTRATE 5 MG PO TABS: 5.0000 mg | ORAL_TABLET | Freq: Every evening | ORAL | 0 refills | Status: DC | PRN

## 2017-08-26 NOTE — Patient Instructions (Addendum)
Thank you for coming in today for your Annual Medicare Wellness Visit.  Things that we discussed today are included in this packet.  Create and/or bring a copy of your Living Will/ Advanced Directive into the office so that we may respect your wishes should an emergency occur.  Get the recommended life-saving vaccines we discussed today.  Get your mammogram/ colonoscopy/ DEXA scans as directed by your provider.  Make sure that your medications are organized and safely stored.  Remember to always ask for help if you forget when/ how to take your medications.  Make healthy food choices (Rich in fruits/ veggies/ lean meats and low in salt, sugar and fat)  Do something that you enjoy for at least 30 minutes every day to stay active (walking, gardening, swimming, etc). This will help you lower your risk of falls/ broken bones.  Be social, do puzzles/ crosswords.  These things help the mind stay young and lower your risk of developing dementia.  Make sure that your home is safe by checking your smoke detectors regularly and doing the things outlined below to lower your risk of falls.  Fall Prevention in the Home Falls can cause injuries and can affect people from all age groups. There are many simple things that you can do to make your home safe and to help prevent falls. What can I do on the outside of my home? Regularly repair the edges of walkways and driveways and fix any cracks. Remove high doorway thresholds. Trim any shrubbery on the main path into your home. Use bright outdoor lighting. Clear walkways of debris and clutter, including tools and rocks. Regularly check that handrails are securely fastened and in good repair. Both sides of any steps should have handrails. Install guardrails along the edges of any raised decks or porches. Have leaves, snow, and ice cleared regularly. Use sand or salt on walkways during winter months. In the garage, clean up any spills right away, including  grease or oil spills. What can I do in the bathroom? Use night lights. Install grab bars by the toilet and in the tub and shower. Do not use towel bars as grab bars. Use non-skid mats or decals on the floor of the tub or shower. If you need to sit down while you are in the shower, use a plastic, non-slip stool. Keep the floor dry. Immediately clean up any water that spills on the floor. Remove soap buildup in the tub or shower on a regular basis. Attach bath mats securely with double-sided non-slip rug tape. Remove throw rugs and other tripping hazards from the floor. What can I do in the bedroom? Use night lights. Make sure that a bedside light is easy to reach. Do not use oversized bedding that drapes onto the floor. Have a firm chair that has side arms to use for getting dressed. Remove throw rugs and other tripping hazards from the floor. What can I do in the kitchen? Clean up any spills right away. Avoid walking on wet floors. Place frequently used items in easy-to-reach places. If you need to reach for something above you, use a sturdy step stool that has a grab bar. Keep electrical cables out of the way. Do not use floor polish or wax that makes floors slippery. If you have to use wax, make sure that it is non-skid floor wax. Remove throw rugs and other tripping hazards from the floor. What can I do in the stairways? Do not leave any items on the stairs.   have to use wax, make sure that it is non-skid floor wax.  Remove throw rugs and other tripping hazards from the floor. What can I do in the stairways?  Do not leave any items on the stairs.  Make sure that there are handrails on both sides of the stairs. Fix handrails that are broken or loose. Make sure that handrails are as long as the stairways.  Check any carpeting to make sure that it is firmly attached to the stairs. Fix any carpet that is loose or worn.  Avoid having throw rugs at the top or bottom of stairways, or secure the rugs with carpet tape to prevent them from moving.  Make sure that you have a light switch at the top of  the stairs and the bottom of the stairs. If you do not have them, have them installed. What are some other fall prevention tips?  Wear closed-toe shoes that fit well and support your feet. Wear shoes that have rubber soles or low heels.  When you use a stepladder, make sure that it is completely opened and that the sides are firmly locked. Have someone hold the ladder while you are using it. Do not climb a closed stepladder.  Add color or contrast paint or tape to grab bars and handrails in your home. Place contrasting color strips on the first and last steps.  Use mobility aids as needed, such as canes, walkers, scooters, and crutches.  Turn on lights if it is dark. Replace any light bulbs that burn out.  Set up furniture so that there are clear paths. Keep the furniture in the same spot.  Fix any uneven floor surfaces.  Choose a carpet design that does not hide the edge of steps of a stairway.  Be aware of any and all pets.  Review your medicines with your healthcare provider. Some medicines can cause dizziness or changes in blood pressure, which increase your risk of falling. Talk with your health care provider about other ways that you can decrease your risk of falls. This may include working with a physical therapist or trainer to improve your strength, balance, and endurance. This information is not intended to replace advice given to you by your health care provider. Make sure you discuss any questions you have with your health care provider. Document Released: 01/03/2002 Document Revised: 06/12/2015 Document Reviewed: 02/17/2014 Elsevier Interactive Patient Education  2017 Elsevier Inc.   Palpitations A palpitation is the feeling that your heart:  Has an uneven (irregular) heartbeat.  Is beating faster than normal.  Is fluttering.  Is skipping a beat.  This is usually not a serious problem. In some cases, you may need more medical tests. Follow these instructions at  home:  Avoid: ? Caffeine in coffee, tea, soft drinks, diet pills, and energy drinks. ? Chocolate. ? Alcohol.  Do not use any tobacco products. These include cigarettes, chewing tobacco, and e-cigarettes. If you need help quitting, ask your doctor.  Try to reduce your stress. These things may help: ? Yoga. ? Meditation. ? Physical activity. Swimming, jogging, and walking are good choices. ? A method that helps you use your mind to control things in your body, like heartbeats (biofeedback).  Get plenty of rest and sleep.  Take over-the-counter and prescription medicines only as told by your doctor.  Keep all follow-up visits as told by your doctor. This is important. Contact a doctor if:  Your heartbeat is still fast or uneven after 24 hours.  Your  palpitations occur more often. Get help right away if:  You have chest pain.  You feel short of breath.  You have a very bad headache.  You feel dizzy.  You pass out (faint). This information is not intended to replace advice given to you by your health care provider. Make sure you discuss any questions you have with your health care provider. Document Released: 10/23/2007 Document Revised: 06/21/2015 Document Reviewed: 09/28/2014 Elsevier Interactive Patient Education  [employer]2018 Elsevier Inc.

## 2017-08-26 NOTE — Progress Notes (Signed)
Subjective:    Candace Lopez is a 67 y.o. female who presents for Medicare Initial preventive examination.  Preventive Screening-Counseling & Management  Tobacco Social History   Tobacco Use  Smoking Status Never Smoker  Smokeless Tobacco Never Used     Problems Prior to Visit 1. Irregular heart beat Patient reports that she has had 2 episodes of fast, pounding heartbeat in the last week.  She notes that this is occurred in the morning when she goes out to water her plants.  It is resolved after rest.  Denies any associated chest pain or shortness of breath.  No dizziness.  It did frighten her however.  Current Problems (verified) Patient Active Problem List   Diagnosis Date Noted  . Degenerative disk disease 06/30/2017  . Mild obstructive sleep apnea 06/30/2017  . Type 2 diabetes mellitus with diabetic neuropathy, unspecified ([place]) 03/14/2016  . Hypertension associated with diabetes ([place]) 03/14/2016  . Hyperlipidemia associated with type 2 diabetes mellitus ([place]) 03/14/2016  . Restless leg syndrome, familial, uncontrolled 03/14/2016  . Health care maintenance 05/04/2012  . Urge incontinence 06/28/2011    Medications Prior to Visit Current Outpatient Medications on File Prior to Visit  Medication Sig Dispense Refill  . aspirin 81 MG chewable tablet Chew 81 mg by mouth 2 (two) times daily.    Marland Kitchen atorvastatin (LIPITOR) 40 MG tablet Take 1 tablet (40 mg total) by mouth daily. For cholesterol 90 tablet 3  . Blood Glucose Monitoring Suppl (ONE TOUCH ULTRA 2) w/Device KIT 1 KIT BY DOES NOT APPLY ROUTE ONCE FOR 1 DOSE.  0  . diclofenac (VOLTAREN) 75 MG EC tablet TAKE 1 TABLET BY MOUTH TWICE A DAY 60 tablet 0  . glucose blood test strip Check blood glucose twice daily 100 each 12  . LANCETS ULTRA THIN MISC Use to check blood sugar twice daily. 200 each 3  . lisinopril (PRINIVIL,ZESTRIL) 20 MG tablet Take 1 tablet (20 mg total) by mouth at bedtime. 90 tablet 1  . metFORMIN (GLUCOPHAGE)  1000 MG tablet Take 1 tablet (1,000 mg total) by mouth 2 (two) times daily with a meal. 180 tablet 1  . TURMERIC PO Take 1 capsule by mouth.     No current facility-administered medications on file prior to visit.     Current Medications (verified) Current Outpatient Medications  Medication Sig Dispense Refill  . aspirin 81 MG chewable tablet Chew 81 mg by mouth 2 (two) times daily.    Marland Kitchen atorvastatin (LIPITOR) 40 MG tablet Take 1 tablet (40 mg total) by mouth daily. For cholesterol 90 tablet 3  . Blood Glucose Monitoring Suppl (ONE TOUCH ULTRA 2) w/Device KIT 1 KIT BY DOES NOT APPLY ROUTE ONCE FOR 1 DOSE.  0  . diclofenac (VOLTAREN) 75 MG EC tablet TAKE 1 TABLET BY MOUTH TWICE A DAY 60 tablet 0  . glucose blood test strip Check blood glucose twice daily 100 each 12  . LANCETS ULTRA THIN MISC Use to check blood sugar twice daily. 200 each 3  . lisinopril (PRINIVIL,ZESTRIL) 20 MG tablet Take 1 tablet (20 mg total) by mouth at bedtime. 90 tablet 1  . metFORMIN (GLUCOPHAGE) 1000 MG tablet Take 1 tablet (1,000 mg total) by mouth 2 (two) times daily with a meal. 180 tablet 1  . TURMERIC PO Take 1 capsule by mouth.     No current facility-administered medications for this visit.      Allergies (verified) Patient has no known allergies.   PAST HISTORY  Family  History Family History  Problem Relation Age of Onset  . Cancer Mother        lung cancer  . Cancer Father        lymphnodes  . Diabetes Sister   . Diabetes Brother   . Diabetes Son   . Cancer Maternal Aunt        throat  . Cancer Paternal Uncle        unknown   . Cancer Maternal Grandfather   . Heart disease Paternal Grandfather   . Cancer Other        breast    Social History Social History   Tobacco Use  . Smoking status: Never Smoker  . Smokeless tobacco: Never Used  Substance Use Topics  . Alcohol use: No     Are there smokers in your home (other than you)? No  Risk Factors Current exercise habits: Home  exercise routine includes gardening. Walks every Tuesday.  Going to start [place] soon on August 6th.  Dietary issues discussed: Watch carbs.     Cardiac risk factors: advanced age (older than 13 for men, 13 for women), diabetes mellitus, dyslipidemia and hypertension.  Depression Screen (Note: if answer to either of the following is "Yes", a more complete depression screening is indicated)   Over the past 2 weeks, have you felt down, depressed or hopeless? No  Over the past 2 weeks, have you felt little interest or pleasure in doing things? No  Have you lost interest or pleasure in daily life? No  Do you often feel hopeless? No  Do you cry easily over simple problems? No  Activities of Daily Living In your present state of health, do you have any difficulty performing the following activities?:  Driving? No Managing money?  No Feeding yourself? No Getting from bed to chair? No Climbing a flight of stairs? No Preparing food and eating?: No Bathing or showering? No Getting dressed: No Getting to the toilet? No Using the toilet:No Moving around from place to place: No In the past year have you fallen or had a near fall?:No   Are you sexually active?  Yes  Do you have more than one partner?  No  Hearing Difficulties: Yes Do you often ask people to speak up or repeat themselves? Yes Do you experience ringing or noises in your ears? Yes Do you have difficulty understanding soft or whispered voices? Yes   Do you feel that you have a problem with memory? No  Do you often misplace items? No  Do you feel safe at home?  No  Cognitive Testing  Alert? Yes  Normal Appearance?Yes  Oriented to person? Yes  Place? Yes   Time? Yes  Recall of three objects?  Yes  Can perform simple calculations? Yes  Displays appropriate judgment?Yes  Can read the correct time from a watch face?Yes   Advanced Directives have been discussed with the patient? Yes  List the Names of Other  Physician/Practitioners you currently use: 1.  Sinclair Ship in [place], [place]- optometrist  Indicate any recent Medical Services you may have received from other than [facility] providers in the past year (date may be approximate).  Immunization History  Administered Date(s) Administered  . Influenza, High Dose Seasonal PF 12/29/2016  . Tdap 07/16/2012    Screening Tests Health Maintenance  Topic Date Due  . Hepatitis C Screening  Dec 10, 1950  . OPHTHALMOLOGY EXAM  08/31/1961  . HIV Screening  09/01/1966  . PAP SMEAR  08/31/1972  .  COLONOSCOPY  08/31/2001  . DEXA SCAN  08/31/2016  . PNA vac Low Risk Adult (1 of 2 - PCV13) 03/31/2023 (Originally 08/31/2016)  . INFLUENZA VACCINE  08/27/2017  . HEMOGLOBIN A1C  12/30/2017  . FOOT EXAM  03/31/2018  . MAMMOGRAM  07/21/2019  . TETANUS/TDAP  07/17/2022    All answers were reviewed with the patient and necessary referrals were made:  Ronnie Doss, DO   08/26/2017   History reviewed: allergies, current medications, past family history, past medical history, past social history, past surgical history and problem list  Review of Systems Pertinent items are noted in HPI.    Objective:     Vision: Sees Dr Suzan Slick in [place].  Wears reading glasses.  Has cataracts.  Body mass index is 28.62 kg/m. BP 129/84   Pulse 73   Temp 97.9 F (36.6 C) (Oral)   Ht '5\' 5"'$  (1.651 m)   Wt 172 lb (78 kg)   BMI 28.62 kg/m   General appearance: alert, cooperative, appears stated age and no distress Head: Normocephalic, without obvious abnormality, atraumatic Lungs: clear to auscultation bilaterally Heart: Regular rate and rhythm.  There is a soft systolic murmur appreciated on the left sternal border. Pulses: 2+ and symmetric  Psych: Mood stable, speech normal, affect appropriate, pleasant, interactive.  Depression screen [facility] 2/9 08/26/2017 06/30/2017 04/06/2017  Decreased Interest 1 0 0  Down, Depressed, Hopeless 0 0 0  PHQ - 2 Score 1 0 0   MMSE - Mini  Mental State Exam 08/26/2017  Orientation to time 5  Orientation to Place 5  Registration 3  Attention/ Calculation 5  Recall 2  Language- name 2 objects 2  Language- repeat 1  Language- follow 3 step command 3  Language- read & follow direction 1  Write a sentence 1  Copy design 1  Total score 29       Assessment:     1. Initial Medicare annual wellness visit Patient overall doing well.  Some sensation of palpitations over the last week.  Heart murmur was noted on today's exam.  Therefore referral placed to cardiology.  Per EMR, it appears that she had a colonoscopy in 2008.  She is due.  I placed referral to gastroenterology for screening for colon cancer.  No previous DEXA per her recall but she has been told that she has osteoporosis in the past.  Will place order for DEXA today.  Pneumococcal vaccine given today.  We will plan for hepatitis C and HIV screening tests during next lab collection.  She needs a follow-up on recent mammogram for abnormal findings within the left breast.  This is been arranged today.  She will obtain records from previous provider regarding her partial hysterectomy.  Unsure if she still has her cervix.  May need Pap smear.  2. Screen for colon cancer - Ambulatory referral to Gastroenterology  3. Asymptomatic postsurgical menopause - DG [facility] DEXA; Future  4. Heart murmur - Ambulatory referral to Cardiology  5. Palpitations - Ambulatory referral to Cardiology  6. Sleep difficulties Compliant with CPAP but states that she has difficulty falling asleep.  Previously used Ambien.  We discussed at last visit that we would prescribe this if symptoms were not improving with CPAP.  This has been sent.       Plan:     During the course of the visit the patient was educated and counseled about appropriate screening and preventive services including:   See above.  Also discussed advanced directive.  She will complete the forms provided and return to that  we may copy this.  Diet review for nutrition referral? Yes ____  Not Indicated __x_  The Narcotic Database has been reviewed.  There were no red flags.    Patient Instructions (the written plan) was given to the patient.  Medicare Attestation I have personally reviewed: The patient's medical and social history Their use of alcohol, tobacco or illicit drugs Their current medications and supplements The patient's functional ability including ADLs,fall risks, home safety risks, cognitive, and hearing and visual impairment Diet and physical activities Evidence for depression or mood disorders  The patient's weight, height, BMI, and visual acuity have been recorded in the chart.  I have made referrals, counseling, and provided education to the patient based on review of the above and I have provided the patient with a written personalized care plan for preventive services.     Ronnie Doss, DO   08/26/2017

## 2017-08-26 NOTE — Progress Notes (Unsigned)
[employer]  [place]

## 2017-08-26 NOTE — Addendum Note (Signed)
Addended byDory [place]: RINTELMANN, GINA C on: 08/26/2017 03:37 PM   Modules accepted: Orders

## 2017-08-30 ENCOUNTER — Encounter: Payer: Self-pay | Admitting: Neurology

## 2017-08-30 DIAGNOSIS — R69 Illness, unspecified: Secondary | ICD-10-CM | POA: Diagnosis not present

## 2017-08-31 ENCOUNTER — Ambulatory Visit (INDEPENDENT_AMBULATORY_CARE_PROVIDER_SITE_OTHER): Payer: Medicare HMO

## 2017-08-31 DIAGNOSIS — Z780 Asymptomatic menopausal state: Secondary | ICD-10-CM | POA: Diagnosis not present

## 2017-08-31 DIAGNOSIS — M8589 Other specified disorders of bone density and structure, multiple sites: Secondary | ICD-10-CM | POA: Diagnosis not present

## 2017-08-31 DIAGNOSIS — E8940 Asymptomatic postprocedural ovarian failure: Secondary | ICD-10-CM

## 2017-09-01 ENCOUNTER — Encounter: Payer: Self-pay | Admitting: Neurology

## 2017-09-01 ENCOUNTER — Ambulatory Visit: Payer: Medicare HMO | Admitting: Neurology

## 2017-09-01 VITALS — BP 130/75 | HR 81 | Ht 65.0 in | Wt 169.0 lb

## 2017-09-01 DIAGNOSIS — G4733 Obstructive sleep apnea (adult) (pediatric): Secondary | ICD-10-CM

## 2017-09-01 DIAGNOSIS — G4761 Periodic limb movement disorder: Secondary | ICD-10-CM

## 2017-09-01 DIAGNOSIS — Z9989 Dependence on other enabling machines and devices: Secondary | ICD-10-CM

## 2017-09-01 DIAGNOSIS — G2581 Restless legs syndrome: Secondary | ICD-10-CM | POA: Diagnosis not present

## 2017-09-01 NOTE — Progress Notes (Signed)
Subjective:    Patient ID: Candace Lopez is a 67 y.o. female.  HPI     Interim history:  Candace Lopez is a 67 year old right-handed woman with an underlying medical history of hypertension, hyperlipidemia, urge incontinence, reflux disease, degenerative disc disease of the lumbar spine, and overweight state, who presents for follow-up consultation of her restless leg syndrome and sleep apnea, after recent sleep testing. The patient is accompanied by her husband today. I first met her on 05/27/2017 at the request of her primary care physician, at which time she reported a long-standing history of restless leg syndrome and a strong family history of RLS as well. She had tried and failed several medications including ropinirole, gabapentin, Sinemet. She was advised to proceed with a sleep study. She had a home sleep test on 06/15/2017 which indicated overall mild obstructive sleep apnea with an AHI of 7.7 per hour, average oxygen saturation of 91%, nadir was 74%. Time below 89% saturation was 25 minutes. She was advised to start AutoPap therapy at home and the hope that she would find that her restless leg symptoms may improve as well.  Today, 09/01/2017: I reviewed her AutoPap compliance data from 08/01/2017 through 08/30/2017 which is a total of 30 days, during which time she used her AutoPap every night with percent used days greater than 4 hours at 100%, indicating superb compliance with an average usage of 7 hours and 9 minutes, residual AHI at goal at 0.6 per hour, 95th percentile pressure of 12.3 cm with range of 8 cm to 15 cm with EPR. Leak on the low side with the 95th percentile at 0.5 L/m. She reports being fully compliant with her AutoPap. She felt that the initial pressure was not high enough at the beginning and her current minimum pressure is 8 cm. She still feels that sometimes she cannot get enough air in. She would like to increase the pressure. She has felt a little improvement as far as her  sleep consolidation and sleep quality but she still has difficulty falling asleep and still suffers from versus leg symptoms. Perhaps there is a slight improvement in her restless legs. She is not keen on trying any new medication at this time, she tried Ambien low-dose 5 mg generic a couple of times as I understand but felt that the restless legs was much worse after she took it. She is not planning on continuing with the Ambien generic. Years ago she may have tried melatonin and would be willing to retry it. She would be willing to continue with the AutoPap.  The patient's allergies, current medications, family history, past medical history, past social history, past surgical history and problem list were reviewed and updated as appropriate.   Previously:  05/27/2017: (She) reports a history of restless leg syndrome for at least 30 years. She has a strong family history of restless leg syndrome including paternal grandmother, mother, sister, and her son has also recently reported symptoms to her. She has tried and failed several medications including gabapentin which was tried last year but did not help, she tried ropinirole for a few days in February of this year but had significant insomnia from it. She does report that it helped calm down her legs but she just could not sleep. She was then given a prescription for Sinemet in March 2019 and tried it again for a few days but had similar side effects of insomnia with it, she also reports that it helped her leg symptoms but again,  she could not sleep at all. She also reports difficulty initiating and maintaining sleep. She has an Epworth sleepiness score of 10 out of 24, fatigue score is 37 out of 63. I reviewed your office note from 04/06/2017. She does have a history of iron deficiency anemia years ago and was treated with iron supplements at the time, this was around or right before her hysterectomy as I understand. She has a bedtime of around 10 PM, rise  time between 5 and 6. She is retired, she lives with her husband, she has one daughter and one son. She has nocturia between 1 or 3 times per night on average, denies morning headaches, she does make abnormal sounds during her sleep including snorting sound and she snores per husband's report. Her sister has sleep apnea and uses a CPAP machine. She would be willing to consider treatment for sleep apnea if the need arises. She stopped gabapentin in December 2018. She is a nonsmoker and does not utilize alcohol, she drinks caffeine rarely.  Her Past Medical History Is Significant For: Past Medical History:  Diagnosis Date  . DDD (degenerative disc disease), lumbar   . GERD (gastroesophageal reflux disease)   . Hyperlipidemia   . Hypertension   . Urge incontinence of urine     Her Past Surgical History Is Significant For: Past Surgical History:  Procedure Laterality Date  . ABDOMINAL HYSTERECTOMY    . arm surgery    . FOOT SURGERY    . KNEE SURGERY      Her Family History Is Significant For: Family History  Problem Relation Age of Onset  . Cancer Mother        lung cancer  . Cancer Father        lymphnodes  . Diabetes Sister   . Diabetes Brother   . Diabetes Son   . Cancer Maternal Aunt        throat  . Cancer Paternal Uncle        unknown   . Cancer Maternal Grandfather   . Heart disease Paternal Grandfather   . Cancer Other        breast    Her Social History Is Significant For: Social History   Socioeconomic History  . Marital status: Married    Spouse name: Not on file  . Number of children: Not on file  . Years of education: Not on file  . Highest education level: Not on file  Occupational History  . Not on file  Social Needs  . Financial resource strain: Not on file  . Food insecurity:    Worry: Not on file    Inability: Not on file  . Transportation needs:    Medical: Not on file    Non-medical: Not on file  Tobacco Use  . Smoking status: Never Smoker   . Smokeless tobacco: Never Used  Substance and Sexual Activity  . Alcohol use: No  . Drug use: No  . Sexual activity: Not on file  Lifestyle  . Physical activity:    Days per week: Not on file    Minutes per session: Not on file  . Stress: Not on file  Relationships  . Social connections:    Talks on phone: Not on file    Gets together: Not on file    Attends religious service: Not on file    Active member of club or organization: Not on file    Attends meetings of clubs or organizations: Not on file  Relationship status: Not on file  Other Topics Concern  . Not on file  Social History Narrative  . Not on file    Her Allergies Are:  No Known Allergies:   Her Current Medications Are:  Outpatient Encounter Medications as of 09/01/2017  Medication Sig  . aspirin 81 MG chewable tablet Chew 81 mg by mouth 2 (two) times daily.  Marland Kitchen atorvastatin (LIPITOR) 40 MG tablet Take 1 tablet (40 mg total) by mouth daily. For cholesterol  . Blood Glucose Monitoring Suppl (ONE TOUCH ULTRA 2) w/Device KIT 1 KIT BY DOES NOT APPLY ROUTE ONCE FOR 1 DOSE.  Marland Kitchen diclofenac (VOLTAREN) 75 MG EC tablet TAKE 1 TABLET BY MOUTH TWICE A DAY  . glucose blood test strip Check blood glucose twice daily  . LANCETS ULTRA THIN MISC Use to check blood sugar twice daily.  Marland Kitchen lisinopril (PRINIVIL,ZESTRIL) 20 MG tablet Take 1 tablet (20 mg total) by mouth at bedtime.  . metFORMIN (GLUCOPHAGE) 1000 MG tablet Take 1 tablet (1,000 mg total) by mouth 2 (two) times daily with a meal.  . TURMERIC PO Take 1 capsule by mouth.  . zolpidem (AMBIEN) 5 MG tablet Take 1 tablet (5 mg total) by mouth at bedtime as needed for sleep.   No facility-administered encounter medications on file as of 09/01/2017.   :  Review of Systems:  Out of a complete 14 point review of systems, all are reviewed and negative with the exception of these symptoms as listed below: Review of Systems  Neurological:       Patient states that she feels like  she's not getting enough air sometimes.    Objective:  Neurological Exam  Physical Exam Physical Examination:   Vitals:   09/01/17 1032  BP: 130/75  Pulse: 81    General Examination: The patient is a very pleasant 67 y.o. female in no acute distress. She appears well-developed and well-nourished and well groomed.   HEENT: Normocephalic, atraumatic, pupils are equal, round and reactive to light and accommodation. Extraocular tracking is good without limitation to gaze excursion or nystagmus noted. Normal smooth pursuit is noted. Hearing is grossly intact. Face is symmetric with normal facial animation and normal facial sensation. Speech is clear with no dysarthria noted. There is no hypophonia. There is no lip, neck/head, jaw or voice tremor. Neck with FROM. Oropharynx exam reveals: mild mouth dryness, adequate dental hygiene and moderate airway crowding. Tongue protrudes centrally and palate elevates symmetrically.   Chest: Clear to auscultation without wheezing, rhonchi or crackles noted.  Heart: S1+S2+0, regular and normal without murmurs, rubs or gallops noted.   Abdomen: Soft, non-tender and non-distended with normal bowel sounds appreciated on auscultation.  Extremities: There is no pitting edema in the distal lower extremities bilaterally.   Skin: Warm and dry without trophic changes noted.  Musculoskeletal: exam reveals no obvious joint deformities, tenderness or joint swelling or erythema.   Neurologically:  Mental status: The patient is awake, alert and oriented in all 4 spheres. Her immediate and remote memory, attention, language skills and fund of knowledge are appropriate. There is no evidence of aphasia, agnosia, apraxia or anomia. Speech is clear with normal prosody and enunciation. Thought process is linear. Mood is normal and affect is normal.  Cranial nerves II - XII are as described above under HEENT exam. Motor exam: Normal bulk, strength and tone is  noted. There is no drift, tremor or rebound. Romberg is negative, except for initial sway. Fine motor skills and coordination: intact  grossly.  Cerebellar testing: No dysmetria or intention tremor. There is no truncal or gait ataxia.  Sensory exam: intact to light touch in the upper and lower extremities.  Gait, station and balance: She stands easily. No veering to one side is noted. No leaning to one side is noted. Posture is age-appropriate and stance is narrow based. Gait shows normal stride length and normal pace. No problems turning are noted. Tandem walk is better today.   Assessment and Plan:   In summary, Candace Lopez is a very pleasant 67 year old female with an underlying medical history of hypertension, hyperlipidemia, urge incontinence, reflux disease, degenerative disc disease of the lumbar spine, and overweight state, who presents for follow-up consultation of her sleep disorder. She has a long history versus leg syndrome and has unfortunately a failed several medications which include gabapentin, Requip and Sinemet. She may not have tried Mirapex but is not sure. She remembers trying one of her mom's medications in the past but does not remember the name of it, does not think it was a benzodiazepine but it certainly helped. Nevertheless, she had a recent home sleep test that showed mild obstructive sleep apnea but desaturation into the 70s. I suggested that she start AutoPap therapy. She has established treatment with AutoPap at home and is fully compliant with it and is highly commended for treatment adherence. She feels that she has slept a little better. She still has trouble going to sleep and admits that she tends to worry at night and has racing thoughts. She recently tried Ambien which caused her restless legs to be worse. She is encouraged to try melatonin again. Furthermore, we mutually agreed to change her AutoPap setting to a set pressure of 12 cm without ramp time. She is encouraged  to give [place] a call in 2 weeks or a month for an update as to her symptoms and we can certainly run another compliance download at the time. She is encouraged to continue with AutoPap. She is not currently keen on trying another medication for her restless legs. We may consider Mirapex down the road or Horizant. At this juncture, she is advised to follow-up in 6 months, she can see one of our nurse practitioners.  We did blood work in May to rule out iron deficiency or anemia. She did not have any evidence of our deficiency. We reviewed her test results today as well as her compliance data. I answered all their questions today and the patient and her husband were in agreement.  I spent 25 minutes in total face-to-face time with the patient, more than 50% of which was spent in counseling and coordination of care, reviewing test results, reviewing medication and discussing or reviewing the diagnosis of RLS, OSA, the its prognosis and treatment options. Pertinent laboratory and imaging test results that were available during this visit with the patient were reviewed by me and considered in my medical decision making (see chart for details).

## 2017-09-01 NOTE — Patient Instructions (Addendum)
You have done a great job using your autoPAP! As discussed, we will change your settings from autoPAP to a set pressure of 12 cm without ramp.  Please call us in 2 weeks so to give us an update and for me to download another compliance report on the new setting.   You can try Melatonin at night for sleep: take 1 mg to 3 mg, one to 2 hours before your bedtime. You can go up to 5 mg if needed. It is over the counter and comes in pill form, chewable form and spray, if you prefer. I would avoid the Ambien (zolpidem).   Keep up the good work! We can see you in 6 months, you can see one of our nurse practitioners as you are stable. I will see you after that.

## 2017-09-02 DIAGNOSIS — N6324 Unspecified lump in the left breast, lower inner quadrant: Secondary | ICD-10-CM | POA: Diagnosis not present

## 2017-09-02 DIAGNOSIS — R922 Inconclusive mammogram: Secondary | ICD-10-CM | POA: Diagnosis not present

## 2017-09-08 ENCOUNTER — Encounter: Payer: Self-pay | Admitting: Nurse Practitioner

## 2017-09-12 DIAGNOSIS — G4733 Obstructive sleep apnea (adult) (pediatric): Secondary | ICD-10-CM | POA: Diagnosis not present

## 2017-09-23 ENCOUNTER — Other Ambulatory Visit: Payer: Self-pay | Admitting: Family Medicine

## 2017-09-25 ENCOUNTER — Encounter: Payer: Self-pay | Admitting: Family Medicine

## 2017-09-25 ENCOUNTER — Ambulatory Visit: Payer: Medicare HMO | Admitting: Family Medicine

## 2017-09-25 VITALS — BP 134/84 | HR 85 | Temp 98.1°F | Ht 65.0 in | Wt 171.0 lb

## 2017-09-25 DIAGNOSIS — G4700 Insomnia, unspecified: Secondary | ICD-10-CM

## 2017-09-25 DIAGNOSIS — G2581 Restless legs syndrome: Secondary | ICD-10-CM | POA: Diagnosis not present

## 2017-09-25 MED ORDER — ZOLPIDEM TARTRATE 5 MG PO TABS: 5.0000 mg | ORAL_TABLET | Freq: Every evening | ORAL | 1 refills | Status: DC | PRN

## 2017-09-25 NOTE — Progress Notes (Signed)
Subjective: CC: insomnia PCP: Janora Norlander, DO [id number] Candace Lopez is a 67 y.o. female presenting to clinic today for:  1. Insomnia Patient presents for her one-month follow-up on insomnia.  At last visit, she had noted difficulty falling asleep.  She thought that this may have been due to OSA and newly initiated CPAP versus restless leg.  She has failed to restless leg medications.  She is not currently amenable to trying anymore at this time.  Her CPAP was actually titrated up to a pressure of 12 recently.  She notes quite a bit of improvement in quality of sleep since this titration occurred.  She continues to have intermittent episodes where she has difficulty falling asleep, citing that it takes her 2 hours to fall asleep.  She reports that Ambien helps very well relieving this issue.  She notes that her restless leg has improved a great amount.  She is unsure if this is related to Ambien use versus the CPAP.  She certainly denies any increased restless leg syndrome with the Ambien.  Denies any excessive daytime sedation, falls or dizziness.  No shortness of breath.   ROS: Per HPI  No Known Allergies Past Medical History:  Diagnosis Date  . DDD (degenerative disc disease), lumbar   . GERD (gastroesophageal reflux disease)   . Hyperlipidemia   . Hypertension   . Urge incontinence of urine     Current Outpatient Medications:  .  aspirin 81 MG chewable tablet, Chew 81 mg by mouth 2 (two) times daily., Disp: , Rfl:  .  atorvastatin (LIPITOR) 40 MG tablet, Take 1 tablet (40 mg total) by mouth daily. For cholesterol, Disp: 90 tablet, Rfl: 3 .  Blood Glucose Monitoring Suppl (ONE TOUCH ULTRA 2) w/Device KIT, 1 KIT BY DOES NOT APPLY ROUTE ONCE FOR 1 DOSE., Disp: , Rfl: 0 .  diclofenac (VOLTAREN) 75 MG EC tablet, TAKE 1 TABLET BY MOUTH TWICE A DAY, Disp: 60 tablet, Rfl: 0 .  glucose blood test strip, Check blood glucose twice daily, Disp: 100 each, Rfl: 12 .  LANCETS ULTRA THIN MISC,  Use to check blood sugar twice daily., Disp: 200 each, Rfl: 3 .  lisinopril (PRINIVIL,ZESTRIL) 20 MG tablet, TAKE 1 TABLET BY MOUTH EVERYDAY AT BEDTIME, Disp: 90 tablet, Rfl: 1 .  metFORMIN (GLUCOPHAGE) 1000 MG tablet, Take 1 tablet (1,000 mg total) by mouth 2 (two) times daily with a meal., Disp: 180 tablet, Rfl: 1 .  TURMERIC PO, Take 1 capsule by mouth., Disp: , Rfl:  .  zolpidem (AMBIEN) 5 MG tablet, Take 1 tablet (5 mg total) by mouth at bedtime as needed for sleep., Disp: 30 tablet, Rfl: 0 Social History   Socioeconomic History  . Marital status: Married    Spouse name: Not on file  . Number of children: Not on file  . Years of education: Not on file  . Highest education level: Not on file  Occupational History  . Not on file  Social Needs  . Financial resource strain: Not on file  . Food insecurity:    Worry: Not on file    Inability: Not on file  . Transportation needs:    Medical: Not on file    Non-medical: Not on file  Tobacco Use  . Smoking status: Never Smoker  . Smokeless tobacco: Never Used  Substance and Sexual Activity  . Alcohol use: No  . Drug use: No  . Sexual activity: Not on file  Lifestyle  . Physical activity:  Days per week: Not on file    Minutes per session: Not on file  . Stress: Not on file  Relationships  . Social connections:    Talks on phone: Not on file    Gets together: Not on file    Attends religious service: Not on file    Active member of club or organization: Not on file    Attends meetings of clubs or organizations: Not on file    Relationship status: Not on file  . Intimate partner violence:    Fear of current or ex partner: Not on file    Emotionally abused: Not on file    Physically abused: Not on file    Forced sexual activity: Not on file  Other Topics Concern  . Not on file  Social History Narrative  . Not on file   Family History  Problem Relation Age of Onset  . Cancer Mother        lung cancer  . Cancer  Father        lymphnodes  . Diabetes Sister   . Diabetes Brother   . Diabetes Son   . Cancer Maternal Aunt        throat  . Cancer Paternal Uncle        unknown   . Cancer Maternal Grandfather   . Heart disease Paternal Grandfather   . Cancer Other        breast    Objective: Office vital signs reviewed. BP 134/84   Pulse 85   Temp 98.1 F (36.7 C) (Oral)   Ht 5' 5"  (1.651 m)   Wt 171 lb (77.6 kg)   BMI 28.46 kg/m   Physical Examination:  General: Awake, alert, well nourished, No acute distress HEENT: MMM, sclera white Cardio: regular rate and rhythm, S1S2 heard, no murmurs appreciated Pulm: clear to auscultation bilaterally, no wheezes, rhonchi or rales; normal work of breathing on room air  Assessment/ Plan: 67 y.o. female   1. Insomnia, unspecified type Doing well with Ambien.  We discussed the risks of frequent use.  I think the patient is using this entirely appropriately, she brings in her bottle today which has over half a bottle left of medicine.  She will continue to use only as needed.  Continue CPAP.  Low threshold for reevaluation should her symptoms return.  She has follow-up scheduled with her sleep specialist, cardiologist and is planning on scheduling an appoint with a gastroenterologist for colonoscopy.  Controlled substance contract signed today.  This is been scanned into the chart.  She will follow-up in 3 months or sooner if needed.  2. Restless leg syndrome, familial, controlled Controlled.  Meds ordered this encounter  Medications  . zolpidem (AMBIEN) 5 MG tablet    Sig: Take 1 tablet (5 mg total) by mouth at bedtime as needed for sleep.    Dispense:  30 tablet    Refill:  1    The Narcotic Database has been reviewed.  There were no red flags.    Janora Norlander, DO [facility] [phone]

## 2017-09-25 NOTE — Patient Instructions (Signed)

## 2017-10-13 DIAGNOSIS — G4733 Obstructive sleep apnea (adult) (pediatric): Secondary | ICD-10-CM | POA: Diagnosis not present

## 2017-10-21 ENCOUNTER — Ambulatory Visit (INDEPENDENT_AMBULATORY_CARE_PROVIDER_SITE_OTHER): Payer: Medicare HMO

## 2017-10-21 DIAGNOSIS — Z23 Encounter for immunization: Secondary | ICD-10-CM | POA: Diagnosis not present

## 2017-10-21 DIAGNOSIS — G4733 Obstructive sleep apnea (adult) (pediatric): Secondary | ICD-10-CM | POA: Diagnosis not present

## 2017-10-22 DIAGNOSIS — E119 Type 2 diabetes mellitus without complications: Secondary | ICD-10-CM | POA: Diagnosis not present

## 2017-10-22 DIAGNOSIS — H52223 Regular astigmatism, bilateral: Secondary | ICD-10-CM | POA: Diagnosis not present

## 2017-10-22 DIAGNOSIS — H5203 Hypermetropia, bilateral: Secondary | ICD-10-CM | POA: Diagnosis not present

## 2017-10-22 DIAGNOSIS — Z7984 Long term (current) use of oral hypoglycemic drugs: Secondary | ICD-10-CM | POA: Diagnosis not present

## 2017-10-22 DIAGNOSIS — H524 Presbyopia: Secondary | ICD-10-CM | POA: Diagnosis not present

## 2017-10-22 LAB — HM DIABETES EYE EXAM

## 2017-10-23 DIAGNOSIS — R69 Illness, unspecified: Secondary | ICD-10-CM | POA: Diagnosis not present

## 2017-10-26 DIAGNOSIS — Z0100 Encounter for examination of eyes and vision without abnormal findings: Secondary | ICD-10-CM | POA: Diagnosis not present

## 2017-10-28 DIAGNOSIS — I491 Atrial premature depolarization: Secondary | ICD-10-CM | POA: Diagnosis not present

## 2017-10-28 DIAGNOSIS — R002 Palpitations: Secondary | ICD-10-CM | POA: Diagnosis not present

## 2017-10-28 DIAGNOSIS — R079 Chest pain, unspecified: Secondary | ICD-10-CM | POA: Diagnosis not present

## 2017-10-28 DIAGNOSIS — I493 Ventricular premature depolarization: Secondary | ICD-10-CM | POA: Diagnosis not present

## 2017-10-28 DIAGNOSIS — I10 Essential (primary) hypertension: Secondary | ICD-10-CM | POA: Diagnosis not present

## 2017-10-28 DIAGNOSIS — R011 Cardiac murmur, unspecified: Secondary | ICD-10-CM | POA: Diagnosis not present

## 2017-10-28 DIAGNOSIS — E785 Hyperlipidemia, unspecified: Secondary | ICD-10-CM | POA: Diagnosis not present

## 2017-10-29 ENCOUNTER — Telehealth: Payer: Self-pay | Admitting: Family Medicine

## 2017-10-30 ENCOUNTER — Other Ambulatory Visit: Payer: Self-pay | Admitting: Family Medicine

## 2017-11-02 DIAGNOSIS — I491 Atrial premature depolarization: Secondary | ICD-10-CM | POA: Diagnosis not present

## 2017-11-02 DIAGNOSIS — I493 Ventricular premature depolarization: Secondary | ICD-10-CM | POA: Diagnosis not present

## 2017-11-04 DIAGNOSIS — R002 Palpitations: Secondary | ICD-10-CM | POA: Diagnosis not present

## 2017-11-04 DIAGNOSIS — I498 Other specified cardiac arrhythmias: Secondary | ICD-10-CM | POA: Diagnosis not present

## 2017-11-12 DIAGNOSIS — G4733 Obstructive sleep apnea (adult) (pediatric): Secondary | ICD-10-CM | POA: Diagnosis not present

## 2017-11-20 ENCOUNTER — Encounter: Payer: Self-pay | Admitting: Family Medicine

## 2017-11-20 DIAGNOSIS — R079 Chest pain, unspecified: Secondary | ICD-10-CM | POA: Diagnosis not present

## 2017-11-20 DIAGNOSIS — R011 Cardiac murmur, unspecified: Secondary | ICD-10-CM | POA: Diagnosis not present

## 2017-12-12 ENCOUNTER — Other Ambulatory Visit: Payer: Self-pay | Admitting: Family Medicine

## 2017-12-12 DIAGNOSIS — R69 Illness, unspecified: Secondary | ICD-10-CM | POA: Diagnosis not present

## 2017-12-13 DIAGNOSIS — G4733 Obstructive sleep apnea (adult) (pediatric): Secondary | ICD-10-CM | POA: Diagnosis not present

## 2017-12-20 ENCOUNTER — Other Ambulatory Visit: Payer: Self-pay | Admitting: Family Medicine

## 2017-12-27 ENCOUNTER — Other Ambulatory Visit: Payer: Self-pay | Admitting: Family Medicine

## 2018-01-04 ENCOUNTER — Ambulatory Visit (INDEPENDENT_AMBULATORY_CARE_PROVIDER_SITE_OTHER): Payer: Medicare HMO | Admitting: Family Medicine

## 2018-01-04 ENCOUNTER — Encounter: Payer: Self-pay | Admitting: Family Medicine

## 2018-01-04 VITALS — BP 143/86 | HR 80 | Temp 97.9°F | Ht 65.0 in | Wt 170.0 lb

## 2018-01-04 DIAGNOSIS — Z2821 Immunization not carried out because of patient refusal: Secondary | ICD-10-CM | POA: Diagnosis not present

## 2018-01-04 DIAGNOSIS — E1159 Type 2 diabetes mellitus with other circulatory complications: Secondary | ICD-10-CM

## 2018-01-04 DIAGNOSIS — G479 Sleep disorder, unspecified: Secondary | ICD-10-CM | POA: Diagnosis not present

## 2018-01-04 DIAGNOSIS — G4733 Obstructive sleep apnea (adult) (pediatric): Secondary | ICD-10-CM

## 2018-01-04 DIAGNOSIS — Z1211 Encounter for screening for malignant neoplasm of colon: Secondary | ICD-10-CM | POA: Diagnosis not present

## 2018-01-04 DIAGNOSIS — E1140 Type 2 diabetes mellitus with diabetic neuropathy, unspecified: Secondary | ICD-10-CM | POA: Diagnosis not present

## 2018-01-04 DIAGNOSIS — E785 Hyperlipidemia, unspecified: Secondary | ICD-10-CM

## 2018-01-04 DIAGNOSIS — E1169 Type 2 diabetes mellitus with other specified complication: Secondary | ICD-10-CM

## 2018-01-04 DIAGNOSIS — I10 Essential (primary) hypertension: Secondary | ICD-10-CM

## 2018-01-04 DIAGNOSIS — I152 Hypertension secondary to endocrine disorders: Secondary | ICD-10-CM

## 2018-01-04 LAB — BAYER DCA HB A1C WAIVED: HB A1C (BAYER DCA - WAIVED): 7.6 % — ABNORMAL HIGH (ref <7.0)

## 2018-01-04 MED ORDER — SITAGLIPTIN PHOSPHATE 100 MG PO TABS: 100.0000 mg | ORAL_TABLET | Freq: Every day | ORAL | 2 refills | Status: DC

## 2018-01-04 MED ORDER — ZOLPIDEM TARTRATE 5 MG PO TABS: 5.0000 mg | ORAL_TABLET | Freq: Every evening | ORAL | 2 refills | Status: DC | PRN

## 2018-01-04 NOTE — Patient Instructions (Addendum)
Ambien has been refilled  Your A1c was 7.6 today.  This is up from your previous check.  I have added Januvia to take 1 tablet daily.  Continue to take metformin twice daily.  We will recheck your blood sugar in 3 months, sooner if needed.  Plan to be fasting.  We will check your cholesterol during that visit.  Remember to bring your monitors in at your next visit and we will check it against our monitor.

## 2018-01-04 NOTE — Progress Notes (Addendum)
Subjective: CC: 3 month f/u DM2, OSA PCP: Janora Norlander, DO IRW:[id number] Candace Lopez is a 67 y.o. female presenting to clinic today for:  1. Type 2 Diabetes/ HLD/ HTN:  Diabetes hx: diagnosed with diabetes almost 15 years ago.  Significant weight loss from 192 pounds to her present weight.  No history of foot ulcerations.  She reports compliance with: Metformin 1063m BID, Lisinopril 217m Lipitor 4022mASA 70m57mide effects: None.  She reports that blood sugars have been running anywhere between 150 and 200.  She does report that there tends to be about a 50 point discrepancy between various monitors, she has 3 different ones at home.  Blood pressures tend to be around 130/80.  She reports that chronic, intermittent numbness in great toes bilaterally.  Last eye exam: 09/2017. No retinopathy. [facility] exam: 03/2017 Last A1c: 7.0 in June 2019 Nephropathy screen indicated?: on ACE-I Last flu, zoster and/or pneumovax: UTD  ROS: Denies LOC, polyuria, polydipsia, unintended weight loss/gain, foot ulcerations, numbness or tingling in extremities or chest pain.  She does report that her family member thought that her speech sounded slurred recently but she did not feel that it was slurred.  She denies any confusion, vision changes, difficulty with ambulation, unilateral weakness or sensation changes.  No difficulty swallowing.  2.  Mild OSA/sleep difficulty Patient continues to do well with Ambien 5 mg daily as needed sleep.  She denies any excessive daytime sedation, falls.  She reports compliance with her CPAP machine and notes that her sleep is much improved since she reduced the temperature on it and changed pressure.  She occasionally does still have issues but things are going much better than previous check.  3.  Preventative Patient now wants to have colonoscopy done.  She prefers to get a refill.  No rectal bleeding or unplanned weight loss.  ROS: Per  HPI  No Known Allergies Past Medical History:  Diagnosis Date  . DDD (degenerative disc disease), lumbar   . GERD (gastroesophageal reflux disease)   . Hyperlipidemia   . Hypertension   . Urge incontinence of urine     Current Outpatient Medications:  .  Ascorbic Acid (VITAMIN C) 100 MG tablet, Take 500 mg by mouth daily., Disp: , Rfl:  .  aspirin 81 MG chewable tablet, Chew 81 mg by mouth 2 (two) times daily., Disp: , Rfl:  .  atorvastatin (LIPITOR) 40 MG tablet, TAKE 1 TABLET (40 MG TOTAL) BY MOUTH DAILY. FOR CHOLESTEROL, Disp: 90 tablet, Rfl: 1 .  Blood Glucose Monitoring Suppl (ONE TOUCH ULTRA 2) w/Device KIT, 1 KIT BY DOES NOT APPLY ROUTE ONCE FOR 1 DOSE., Disp: , Rfl: 0 .  cholecalciferol (VITAMIN D) 1000 units tablet, Take 1,000 Units by mouth daily., Disp: , Rfl:  .  diclofenac (VOLTAREN) 75 MG EC tablet, TAKE 1 TABLET BY MOUTH TWICE A DAY, Disp: 60 tablet, Rfl: 0 .  glucose blood test strip, Check blood glucose twice daily, Disp: 100 each, Rfl: 12 .  LANCETS ULTRA THIN MISC, Use to check blood sugar twice daily., Disp: 200 each, Rfl: 3 .  lisinopril (PRINIVIL,ZESTRIL) 20 MG tablet, TAKE 1 TABLET BY MOUTH EVERYDAY AT BEDTIME, Disp: 90 tablet, Rfl: 1 .  metFORMIN (GLUCOPHAGE) 1000 MG tablet, TAKE 1 TABLET (1,000 MG TOTAL) BY MOUTH 2 (TWO) TIMES DAILY WITH A MEAL., Disp: 180 tablet, Rfl: 0 .  metoprolol succinate (TOPROL-XL) 25 MG 24 hr tablet, Take 25 mg by mouth  daily., Disp: , Rfl: 5 .  TURMERIC PO, Take 1 capsule by mouth., Disp: , Rfl:  .  zolpidem (AMBIEN) 5 MG tablet, TAKE 1 TABLET (5 MG TOTAL) BY MOUTH AT BEDTIME AS NEEDED FOR SLEEP., Disp: 14 tablet, Rfl: 0 Social History   Socioeconomic History  . Marital status: Married    Spouse name: Not on file  . Number of children: Not on file  . Years of education: Not on file  . Highest education level: Not on file  Occupational History  . Not on file  Social Needs  . Financial resource strain: Not on file  . Food  insecurity:    Worry: Not on file    Inability: Not on file  . Transportation needs:    Medical: Not on file    Non-medical: Not on file  Tobacco Use  . Smoking status: Never Smoker  . Smokeless tobacco: Never Used  Substance and Sexual Activity  . Alcohol use: No  . Drug use: No  . Sexual activity: Not on file  Lifestyle  . Physical activity:    Days per week: Not on file    Minutes per session: Not on file  . Stress: Not on file  Relationships  . Social connections:    Talks on phone: Not on file    Gets together: Not on file    Attends religious service: Not on file    Active member of club or organization: Not on file    Attends meetings of clubs or organizations: Not on file    Relationship status: Not on file  . Intimate partner violence:    Fear of current or ex partner: Not on file    Emotionally abused: Not on file    Physically abused: Not on file    Forced sexual activity: Not on file  Other Topics Concern  . Not on file  Social History Narrative  . Not on file   Family History  Problem Relation Age of Onset  . Cancer Mother        lung cancer  . Cancer Father        lymphnodes  . Diabetes Sister   . Diabetes Brother   . Diabetes Son   . Cancer Maternal Aunt        throat  . Cancer Paternal Uncle        unknown   . Cancer Maternal Grandfather   . Heart disease Paternal Grandfather   . Cancer Other        breast    Objective: Office vital signs reviewed. BP (!) 143/86   Pulse 80   Temp 97.9 F (36.6 C) (Oral)   Ht _0  (1.651 m)   Wt 170 lb (77.1 kg)   BMI 28.29 kg/m   Physical Examination:  General: Awake, alert, well nourished, No acute distress HEENT: Normal    Eyes: PERRLA, extraocular membranes intact, sclera white    Throat: moist mucus membranes Cardio: regular rate and rhythm, S1S2 heard, no murmurs appreciated Pulm: clear to auscultation bilaterally, no wheezes, rhonchi or rales; normal work of breathing on room  air Extremities: warm, well perfused, No edema, cyanosis or clubbing; +2 pulses bilaterally MSK: normal gait and normal station Skin: dry; intact; no rashes or lesions  Assessment/ Plan: 67 y.o. female   1. Type 2 diabetes mellitus with diabetic neuropathy, without long-term current use of insulin (HCC) Not at goal.  Today's A1c was 7.6.  I have added Januvia 100  mg daily to take in addition to her twice daily metformin 1000 mg.  She is to continue monitoring blood sugars daily.  I will have her follow-up in 3 months or sooner if needed.  Plan to check fasting labs at that visit as well as update her foot exam which will be due in March 2020. - Bayer DCA Hb A1c Waived  2. Hypertension associated with diabetes ([place]) Technically uncontrolled on current regimen given concomitant type 2 diabetes.  Goal is less than 140/90.  Her home BPs are at goal.  I have advanced her diabetic regimen.  If blood pressures remain above goal, we will plan to increase lisinopril dose to 30 mg daily.  She is to continue monitoring blood pressures at home  3. Hyperlipidemia associated with type 2 diabetes mellitus (HCC) Continue statin.  4. Mild obstructive sleep apnea Seems doing better.  I have refilled her Ambien as below.  5. Sleep difficulties Refill ambien 93m daily prn x3 months. The Narcotic Database has been reviewed.  There were no red flags.    6. Screening for malignant neoplasm of colon [place] - Ambulatory referral to Gastroenterology   Orders Placed This Encounter  Procedures  . Bayer DCA Hb A1c Waived  . Ambulatory referral to Gastroenterology    Referral Priority:   Routine    Referral Type:   Consultation    Referral Reason:   Specialty Services Required    Number of Visits Requested:   1   Meds ordered this encounter  Medications  . zolpidem (AMBIEN) 5 MG tablet    Sig: Take 1 tablet (5 mg total) by mouth at bedtime as needed for  sleep.    Dispense:  30 tablet    Refill:  2    Needs OV  . sitaGLIPtin (JANUVIA) 100 MG tablet    Sig: Take 1 tablet (100 mg total) by mouth daily.    Dispense:  30 tablet    Refill:  [phone]

## 2018-01-05 ENCOUNTER — Encounter (INDEPENDENT_AMBULATORY_CARE_PROVIDER_SITE_OTHER): Payer: Self-pay | Admitting: *Deleted

## 2018-01-06 ENCOUNTER — Telehealth: Payer: Self-pay | Admitting: Family Medicine

## 2018-01-06 NOTE — Telephone Encounter (Signed)
Pt states she is having trouble with [facility] and them calling her all the time for refills she doesn't need on her test strips. Advised she would need to call [facility] to tell them to take her off the automated refills. Pt voiced understanding.

## 2018-01-12 DIAGNOSIS — G4733 Obstructive sleep apnea (adult) (pediatric): Secondary | ICD-10-CM | POA: Diagnosis not present

## 2018-01-22 DIAGNOSIS — G4733 Obstructive sleep apnea (adult) (pediatric): Secondary | ICD-10-CM | POA: Diagnosis not present

## 2018-01-31 DIAGNOSIS — R69 Illness, unspecified: Secondary | ICD-10-CM | POA: Diagnosis not present

## 2018-02-01 ENCOUNTER — Other Ambulatory Visit: Payer: Self-pay | Admitting: Family Medicine

## 2018-02-02 DIAGNOSIS — R69 Illness, unspecified: Secondary | ICD-10-CM | POA: Diagnosis not present

## 2018-02-03 ENCOUNTER — Other Ambulatory Visit (INDEPENDENT_AMBULATORY_CARE_PROVIDER_SITE_OTHER): Payer: Self-pay | Admitting: *Deleted

## 2018-02-03 DIAGNOSIS — Z1211 Encounter for screening for malignant neoplasm of colon: Secondary | ICD-10-CM | POA: Insufficient documentation

## 2018-02-12 DIAGNOSIS — G4733 Obstructive sleep apnea (adult) (pediatric): Secondary | ICD-10-CM | POA: Diagnosis not present

## 2018-02-15 ENCOUNTER — Other Ambulatory Visit: Payer: Self-pay | Admitting: Family Medicine

## 2018-02-15 NOTE — Telephone Encounter (Signed)
What is the name of the medication? Diclofenac 75 mg   Have you contacted your pharmacy to request a refill? yes  Which pharmacy would you [facility] to? [facility]   Patient notified that their request is being sent to the clinical staff for review and that they should receive a call once it is complete. If they do not receive a call within 24 hours they can check with their pharmacy or our office.  PAtient has been waiting 2 weeks pharmacy told her they faxed [place] on 1/9 and have not got anything back.

## 2018-02-16 MED ORDER — DICLOFENAC SODIUM 75 MG PO TBEC: 75.0000 mg | DELAYED_RELEASE_TABLET | Freq: Two times a day (BID) | ORAL | 1 refills | Status: DC | PRN

## 2018-02-19 ENCOUNTER — Ambulatory Visit (INDEPENDENT_AMBULATORY_CARE_PROVIDER_SITE_OTHER): Payer: Medicare HMO | Admitting: Family Medicine

## 2018-02-19 VITALS — BP 145/80 | HR 62 | Temp 96.9°F | Ht 65.0 in | Wt 160.0 lb

## 2018-02-19 DIAGNOSIS — I951 Orthostatic hypotension: Secondary | ICD-10-CM | POA: Diagnosis not present

## 2018-02-19 MED ORDER — MECLIZINE HCL 25 MG PO TABS: 12.5000 mg | ORAL_TABLET | Freq: Three times a day (TID) | ORAL | 0 refills | Status: DC | PRN

## 2018-02-19 NOTE — Progress Notes (Signed)
Subjective: CC: Dizziness PCP: Janora Norlander, DO WPY:[id number] Dalporto is a 68 y.o. female presenting to clinic today for:  1. Dizziness Patient reports onset of dizziness this morning.  She notes that it started abruptly when she got out of bed and she fell onto the wall because she was so lightheaded.  Denies room spinning.  Denies any difficulty with speech, visual disturbance, unilateral weakness or sensation change.  She notes that symptoms seem that gradually resolved but then returned when she was doing seated kick exercises.  She came in today for evaluation of this.  She is not taking any medicines.  She reports good hydration.  Blood sugars and blood pressures have been stable.  Additionally, she never started Januvia.  ROS: Per HPI  No Known Allergies Past Medical History:  Diagnosis Date  . DDD (degenerative disc disease), lumbar   . GERD (gastroesophageal reflux disease)   . Hyperlipidemia   . Hypertension   . Urge incontinence of urine     Current Outpatient Medications:  .  Ascorbic Acid (VITAMIN C) 100 MG tablet, Take 500 mg by mouth daily., Disp: , Rfl:  .  aspirin 81 MG chewable tablet, Chew 81 mg by mouth 2 (two) times daily., Disp: , Rfl:  .  atorvastatin (LIPITOR) 40 MG tablet, TAKE 1 TABLET (40 MG TOTAL) BY MOUTH DAILY. FOR CHOLESTEROL, Disp: 90 tablet, Rfl: 1 .  Blood Glucose Monitoring Suppl (ONE TOUCH ULTRA 2) w/Device KIT, 1 KIT BY DOES NOT APPLY ROUTE ONCE FOR 1 DOSE., Disp: , Rfl: 0 .  cholecalciferol (VITAMIN D) 1000 units tablet, Take 1,000 Units by mouth daily., Disp: , Rfl:  .  diclofenac (VOLTAREN) 75 MG EC tablet, Take 1 tablet (75 mg total) by mouth 2 (two) times daily as needed for moderate pain., Disp: 60 tablet, Rfl: 1 .  glucose blood test strip, Check blood glucose twice daily, Disp: 100 each, Rfl: 12 .  lisinopril (PRINIVIL,ZESTRIL) 20 MG tablet, TAKE 1 TABLET BY MOUTH EVERYDAY AT BEDTIME, Disp: 90 tablet, Rfl: 1 .  metFORMIN (GLUCOPHAGE)  1000 MG tablet, TAKE 1 TABLET (1,000 MG TOTAL) BY MOUTH 2 (TWO) TIMES DAILY WITH A MEAL., Disp: 180 tablet, Rfl: 0 .  metoprolol succinate (TOPROL-XL) 25 MG 24 hr tablet, Take 25 mg by mouth daily., Disp: , Rfl: 5 .  ONETOUCH DELICA LANCETS 82N MISC, USE TO CHECK BLOOD SUGAR TWICE DAILY., Disp: 200 each, Rfl: 2 .  TURMERIC PO, Take 1 capsule by mouth., Disp: , Rfl:  .  zolpidem (AMBIEN) 5 MG tablet, Take 1 tablet (5 mg total) by mouth at bedtime as needed for sleep., Disp: 30 tablet, Rfl: 2  Social History   Socioeconomic History  . Marital status: Married    Spouse name: Not on file  . Number of children: Not on file  . Years of education: Not on file  . Highest education level: Not on file  Occupational History  . Not on file  Social Needs  . Financial resource strain: Not on file  . Food insecurity:    Worry: Not on file    Inability: Not on file  . Transportation needs:    Medical: Not on file    Non-medical: Not on file  Tobacco Use  . Smoking status: Never Smoker  . Smokeless tobacco: Never Used  Substance and Sexual Activity  . Alcohol use: No  . Drug use: No  . Sexual activity: Not on file  Lifestyle  . Physical activity:  Days per week: Not on file    Minutes per session: Not on file  . Stress: Not on file  Relationships  . Social connections:    Talks on phone: Not on file    Gets together: Not on file    Attends religious service: Not on file    Active member of club or organization: Not on file    Attends meetings of clubs or organizations: Not on file    Relationship status: Not on file  . Intimate partner violence:    Fear of current or ex partner: Not on file    Emotionally abused: Not on file    Physically abused: Not on file    Forced sexual activity: Not on file  Other Topics Concern  . Not on file  Social History Narrative  . Not on file   Family History  Problem Relation Age of Onset  . Cancer Mother        lung cancer  . Cancer Father          lymphnodes  . Diabetes Sister   . Diabetes Brother   . Diabetes Son   . Cancer Maternal Aunt        throat  . Cancer Paternal Uncle        unknown   . Cancer Maternal Grandfather   . Heart disease Paternal Grandfather   . Cancer Other        breast    Objective: Office vital signs reviewed. BP (!) 145/80   Pulse 62   Temp (!) 96.9 F (36.1 C) (Oral)   Ht 5' 5"  (1.651 m)   Wt 160 lb (72.6 kg)   BMI 26.63 kg/m   Physical Examination:  General: Awake, alert, well nourished, No acute distress HEENT: Normal    Neck: No masses palpated. No lymphadenopathy    Ears: Tympanic membranes intact, normal light reflex, no erythema, no bulging    Eyes: PERRLA, extraocular membranes intact, sclera white    Nose: nasal turbinates moist, clear nasal discharge    Throat: moist mucus membranes, no erythema, no tonsillar exudate.  Airway is patent.  Symmetric rise of palate noted Extremities: warm, well perfused, No edema, cyanosis or clubbing; +2 pulses bilaterally MSK: normal gait and station Skin: dry; intact; no rashes or lesions Neuro: 5/5 UE and LE Strength and light touch sensation grossly intact, patellar DTRs 2/4; CN 2-12 grossly in tact. No nystagmus.  Negative Rhomberg  Orthostatic VS for the past 24 hrs:  BP- Lying Pulse- Lying BP- Sitting Pulse- Sitting BP- Standing at 0 minutes Pulse- Standing at 0 minutes  02/19/18 1552 164/78 65 145/80 62 155/78 64    Assessment/ Plan: 68 y.o. female   1. Orthostatic hypotension Dizziness likely related to orthostatic hypotension.  She had about a 20 point drop from lying to sitting position and her systolic blood pressure.  Her neurologic exam was totally unremarkable.  She is currently asymptomatic.  I recommended compression hose, adequate hydration and changing of position slightly.  I have also given her meclizine to have on hand if needed.  She is to follow-up PRN.  We discussed reasons for emergent evaluation.  She voiced  understanding.   No orders of the defined types were placed in this encounter.  Meds ordered this encounter  Medications  . meclizine (ANTIVERT) 25 MG tablet    Sig: Take 0.5-1 tablets (12.5-25 mg total) by mouth 3 (three) times daily as needed for dizziness.    Dispense:  30 tablet    Refill:  [place], DO [place] [phone]

## 2018-02-19 NOTE — Patient Instructions (Signed)
Use compression hose.  Make sure you are changing positions slowly.  If the dizziness returns despite both of these, you may use meclizine.  We discussed that it can sometimes cause sleepiness so do not drive.   Orthostatic Hypotension Blood pressure is a measurement of how strongly, or weakly, your blood is pressing against the walls of your arteries. Orthostatic hypotension is a sudden drop in blood pressure that happens when you quickly change positions, such as when you get up from sitting or lying down. Arteries are blood vessels that carry blood from your heart throughout your body. When blood pressure is too low, you may not get enough blood to your brain or to the rest of your organs. This can cause weakness, light-headedness, rapid heartbeat, and fainting. This can last for just a few seconds or for up to a few minutes. Orthostatic hypotension is usually not a serious problem. However, if it happens frequently or gets worse, it may be a sign of something more serious. What are the causes? This condition may be caused by:  Sudden changes in posture, such as standing up quickly after you have been sitting or lying down.  Blood loss.  Loss of body fluids (dehydration).  Heart problems.  Hormone (endocrine) problems.  Pregnancy.  Severe infection.  Lack of certain nutrients.  Severe allergic reactions (anaphylaxis).  Certain medicines, such as blood pressure medicine or medicines that make the body lose excess fluids (diuretics). Sometimes, this condition can be caused by not taking medicine as directed, such as taking too much of a certain medicine. What increases the risk? The following factors may make you more likely to develop this condition:  Age. Risk increases as you get older.  Conditions that affect the heart or the central nervous system.  Taking certain medicines, such as blood pressure medicine or diuretics.  Being pregnant. What are the signs or  symptoms? Symptoms of this condition may include:  Weakness.  Light-headedness.  Dizziness.  Blurred vision.  Fatigue.  Rapid heartbeat.  Fainting, in severe cases. How is this diagnosed? This condition is diagnosed based on:  Your medical history.  Your symptoms.  Your blood pressure measurement. Your health care provider will check your blood pressure when you are: ? Lying down. ? Sitting. ? Standing. A blood pressure reading is recorded as two numbers, such as "120 over 80" (or 120/80). The first ("top") number is called the systolic pressure. It is a measure of the pressure in your arteries as your heart beats. The second ("bottom") number is called the diastolic pressure. It is a measure of the pressure in your arteries when your heart relaxes between beats. Blood pressure is measured in a unit called mm Hg. Healthy blood pressure for most adults is 120/80. If your blood pressure is below 90/60, you may be diagnosed with hypotension. Other information or tests that may be used to diagnose orthostatic hypotension include:  Your other vital signs, such as your heart rate and temperature.  Blood tests.  Tilt table test. For this test, you will be safely secured to a table that moves you from a lying position to an upright position. Your heart rhythm and blood pressure will be monitored during the test. How is this treated? This condition may be treated by:  Changing your diet. This may involve eating more salt (sodium) or drinking more water.  Taking medicines to raise your blood pressure.  Changing the dosage of certain medicines you are taking that might be lowering  your blood pressure.  Wearing compression stockings. These stockings help to prevent blood clots and reduce swelling in your legs. In some cases, you may need to go to the hospital for:  Fluid replacement. This means you will receive fluids through an IV.  Blood replacement. This means you will receive  donated blood through an IV (transfusion).  Treating an infection or heart problems, if this applies.  Monitoring. You may need to be monitored while medicines that you are taking wear off. Follow these instructions at home: Eating and drinking   Drink enough fluid to keep your urine pale yellow.  Eat a healthy diet, and follow instructions from your health care provider about eating or drinking restrictions. A healthy diet includes: ? Fresh fruits and vegetables. ? Whole grains. ? Lean meats. ? Low-fat dairy products.  Eat extra salt only as directed. Do not add extra salt to your diet unless your health care provider told you to do that.  Eat frequent, small meals.  Avoid standing up suddenly after eating. Medicines  Take over-the-counter and prescription medicines only as told by your health care provider. ? Follow instructions from your health care provider about changing the dosage of your current medicines, if this applies. ? Do not stop or adjust any of your medicines on your own. General instructions   Wear compression stockings as told by your health care provider.  Get up slowly from lying down or sitting positions. This gives your blood pressure a chance to adjust.  Avoid hot showers and excessive heat as directed by your health care provider.  Return to your normal activities as told by your health care provider. Ask your health care provider what activities are safe for you.  Do not use any products that contain nicotine or tobacco, such as cigarettes, e-cigarettes, and chewing tobacco. If you need help quitting, ask your health care provider.  Keep all follow-up visits as told by your health care provider. This is important. Contact a health care provider if you:  Vomit.  Have diarrhea.  Have a fever for more than 2-3 days.  Feel more thirsty than usual.  Feel weak and tired. Get help right away if you:  Have chest pain.  Have a fast or irregular  heartbeat.  Develop numbness in any part of your body.  Cannot move your arms or your legs.  Have trouble speaking.  Become sweaty or feel light-headed.  Faint.  Feel short of breath.  Have trouble staying awake.  Feel confused. Summary  Orthostatic hypotension is a sudden drop in blood pressure that happens when you quickly change positions.  Orthostatic hypotension is usually not a serious problem.  It is diagnosed by having your blood pressure taken lying down, sitting, and then standing.  It may be treated by changing your diet or adjusting your medicines. This information is not intended to replace advice given to you by your health care provider. Make sure you discuss any questions you have with your health care provider. Document Released: 01/03/2002 Document Revised: 07/09/2017 Document Reviewed: 07/09/2017 Elsevier Interactive Patient Education  [employer].

## 2018-03-09 ENCOUNTER — Ambulatory Visit: Payer: Medicare HMO | Admitting: Neurology

## 2018-03-09 ENCOUNTER — Ambulatory Visit: Payer: Medicare HMO | Admitting: Nurse Practitioner

## 2018-03-15 DIAGNOSIS — G4733 Obstructive sleep apnea (adult) (pediatric): Secondary | ICD-10-CM | POA: Diagnosis not present

## 2018-03-25 ENCOUNTER — Other Ambulatory Visit: Payer: Self-pay | Admitting: Family Medicine

## 2018-03-28 ENCOUNTER — Encounter: Payer: Self-pay | Admitting: Neurology

## 2018-03-30 ENCOUNTER — Telehealth (INDEPENDENT_AMBULATORY_CARE_PROVIDER_SITE_OTHER): Payer: Self-pay | Admitting: *Deleted

## 2018-03-30 ENCOUNTER — Encounter (INDEPENDENT_AMBULATORY_CARE_PROVIDER_SITE_OTHER): Payer: Self-pay | Admitting: *Deleted

## 2018-03-30 ENCOUNTER — Encounter: Payer: Self-pay | Admitting: Neurology

## 2018-03-30 ENCOUNTER — Ambulatory Visit: Payer: Medicare HMO | Admitting: Neurology

## 2018-03-30 VITALS — BP 154/77 | HR 74 | Ht 65.0 in | Wt 156.0 lb

## 2018-03-30 DIAGNOSIS — G2581 Restless legs syndrome: Secondary | ICD-10-CM | POA: Diagnosis not present

## 2018-03-30 DIAGNOSIS — G4733 Obstructive sleep apnea (adult) (pediatric): Secondary | ICD-10-CM

## 2018-03-30 DIAGNOSIS — Z9989 Dependence on other enabling machines and devices: Secondary | ICD-10-CM

## 2018-03-30 NOTE — Progress Notes (Signed)
fSubjective:    Patient ID: Azariah Latendresse is a 68 y.o. female.  HPI     Interim history:   Ms. Kreitz is a 68 year old right-handed woman with an underlying medical history of hypertension, hyperlipidemia, urge incontinence, reflux disease, degenerative disc disease of the lumbar spine, and overweight state, who presents for follow-up consultation of her restless leg syndrome and sleep apnea, on PAP treatment. The patient is unaccompanied today. I last saw her on 09/01/2017, at which time she was compliant with AutoPap. Sometimes she was struggling to get enough air in. I suggested we change her settings to CPAP of 12 cm. We talked about potentially utilizing Mirapex down the road or Horizant. She reported that she felt sleep was a little better. She was not keen on trying any new medications. She had tried Ambien and it made her RLS worse.   Today, 03/30/2018: I reviewed her CPAP compliance data from 02/27/2018 through 03/28/2018 which is a total of 30 days, during which time she used her CPAP every night with percent used days greater than 4 hours at 93%, indicating excellent compliance with an average usage of 5 hours and 54 minutes, residual AHI at goal at 0.3 per hour, leak on the low side with the 95th percentile at 5.5 L/m on a pressure of 12 cm with EPR of 3. She reports doing well with her CPAP. She is fully compliant. She reports that her restless legs symptoms are contained, in fact, she has done rather well. It has helped to exercise on a regular basis, in particular, using resistance bands during exercise classes has been very helpful and in the past few days she has had no symptoms. She is very pleased with her exercise regimen, she [place] every day to the rec center. She hydrates well with water.   The patient's allergies, current medications, family history, past medical history, past social history, past surgical history and problem list were reviewed and updated as appropriate.     Previously:   I first met her on 05/27/2017 at the request of her primary care physician, at which time she reported a long-standing history of restless leg syndrome and a strong family history of RLS as well. She had tried and failed several medications including ropinirole, gabapentin, Sinemet. She was advised to proceed with a sleep study. She had a home sleep test on 06/15/2017 which indicated overall mild obstructive sleep apnea with an AHI of 7.7 per hour, average oxygen saturation of 91%, nadir was 74%. Time below 89% saturation was 25 minutes. She was advised to start AutoPap therapy at home and the hope that she would find that her restless leg symptoms may improve as well.   I reviewed her AutoPap compliance data from 08/01/2017 through 08/30/2017 which is a total of 30 days, during which time she used her AutoPap every night with percent used days greater than 4 hours at 100%, indicating superb compliance with an average usage of 7 hours and 9 minutes, residual AHI at goal at 0.6 per hour, 95th percentile pressure of 12.3 cm with range of 8 cm to 15 cm with EPR. Leak on the low side with the 95th percentile at 0.5 L/m.    05/27/2017: (She) reports a history of restless leg syndrome for at least 30 years. She has a strong family history of restless leg syndrome including paternal grandmother, mother, sister, and her son has also recently reported symptoms to her. She has tried and failed several medications including gabapentin which  was tried last year but did not help, she tried ropinirole for a few days in February of this year but had significant insomnia from it. She does report that it helped calm down her legs but she just could not sleep. She was then given a prescription for Sinemet in March 2019 and tried it again for a few days but had similar side effects of insomnia with it, she also reports that it helped her leg symptoms but again, she could not sleep at all. She also reports  difficulty initiating and maintaining sleep. She has an Epworth sleepiness score of 10 out of 24, fatigue score is 37 out of 63. I reviewed your office note from 04/06/2017. She does have a history of iron deficiency anemia years ago and was treated with iron supplements at the time, this was around or right before her hysterectomy as I understand. She has a bedtime of around 10 PM, rise time between 5 and 6. She is retired, she lives with her husband, she has one daughter and one son. She has nocturia between 1 or 3 times per night on average, denies morning headaches, she does make abnormal sounds during her sleep including snorting sound and she snores per husband's report. Her sister has sleep apnea and uses a CPAP machine. She would be willing to consider treatment for sleep apnea if the need arises. She stopped gabapentin in December 2018. She is a nonsmoker and does not utilize alcohol, she drinks caffeine rarely.   Her Past Medical History Is Significant For: Past Medical History:  Diagnosis Date  . DDD (degenerative disc disease), lumbar   . GERD (gastroesophageal reflux disease)   . Hyperlipidemia   . Hypertension   . Urge incontinence of urine     Her Past Surgical History Is Significant For: Past Surgical History:  Procedure Laterality Date  . ABDOMINAL HYSTERECTOMY    . arm surgery    . FOOT SURGERY    . KNEE SURGERY      Her Family History Is Significant For: Family History  Problem Relation Age of Onset  . Cancer Mother        lung cancer  . Cancer Father        lymphnodes  . Diabetes Sister   . Diabetes Brother   . Diabetes Son   . Cancer Maternal Aunt        throat  . Cancer Paternal Uncle        unknown   . Cancer Maternal Grandfather   . Heart disease Paternal Grandfather   . Cancer Other        breast    Her Social History Is Significant For: Social History   Socioeconomic History  . Marital status: Married    Spouse name: Not on file  . Number of  children: Not on file  . Years of education: Not on file  . Highest education level: Not on file  Occupational History  . Not on file  Social Needs  . Financial resource strain: Not on file  . Food insecurity:    Worry: Not on file    Inability: Not on file  . Transportation needs:    Medical: Not on file    Non-medical: Not on file  Tobacco Use  . Smoking status: Never Smoker  . Smokeless tobacco: Never Used  Substance and Sexual Activity  . Alcohol use: No  . Drug use: No  . Sexual activity: Not on file  Lifestyle  .  Physical activity:    Days per week: Not on file    Minutes per session: Not on file  . Stress: Not on file  Relationships  . Social connections:    Talks on phone: Not on file    Gets together: Not on file    Attends religious service: Not on file    Active member of club or organization: Not on file    Attends meetings of clubs or organizations: Not on file    Relationship status: Not on file  Other Topics Concern  . Not on file  Social History Narrative  . Not on file    Her Allergies Are:  No Known Allergies:   Her Current Medications Are:  Outpatient Encounter Medications as of 03/30/2018  Medication Sig  . Ascorbic Acid (VITAMIN C) 100 MG tablet Take 500 mg by mouth daily.  Marland Kitchen aspirin 81 MG chewable tablet Chew 81 mg by mouth 2 (two) times daily.  Marland Kitchen atorvastatin (LIPITOR) 40 MG tablet TAKE 1 TABLET (40 MG TOTAL) BY MOUTH DAILY. FOR CHOLESTEROL  . Blood Glucose Monitoring Suppl (ONE TOUCH ULTRA 2) w/Device KIT 1 KIT BY DOES NOT APPLY ROUTE ONCE FOR 1 DOSE.  . cholecalciferol (VITAMIN D) 1000 units tablet Take 1,000 Units by mouth daily.  . diclofenac (VOLTAREN) 75 MG EC tablet Take 1 tablet (75 mg total) by mouth 2 (two) times daily as needed for moderate pain.  Marland Kitchen glucose blood test strip Check blood glucose twice daily  . lisinopril (PRINIVIL,ZESTRIL) 20 MG tablet TAKE 1 TABLET BY MOUTH EVERYDAY AT BEDTIME  . metFORMIN (GLUCOPHAGE) 1000 MG tablet  TAKE 1 TABLET (1,000 MG TOTAL) BY MOUTH 2 (TWO) TIMES DAILY WITH A MEAL.  . metoprolol succinate (TOPROL-XL) 25 MG 24 hr tablet Take 25 mg by mouth daily.  Glory Rosebush DELICA LANCETS 95A MISC USE TO CHECK BLOOD SUGAR TWICE DAILY.  Marland Kitchen TURMERIC PO Take 1 capsule by mouth.  . zolpidem (AMBIEN) 5 MG tablet Take 1 tablet (5 mg total) by mouth at bedtime as needed for sleep.  . meclizine (ANTIVERT) 25 MG tablet Take 0.5-1 tablets (12.5-25 mg total) by mouth 3 (three) times daily as needed for dizziness. (Patient not taking: Reported on 03/30/2018)   No facility-administered encounter medications on file as of 03/30/2018.   :  Review of Systems:  Out of a complete 14 point review of systems, all are reviewed and negative with the exception of these symptoms as listed below: Review of Systems  Neurological:       Pt presents today to discuss her cpap. Pt reports that her cpap is going well.    Objective:  Neurological Exam  Physical Exam Physical Examination:   Vitals:   03/30/18 1253  BP: (!) 154/77  Pulse: 74    General Examination: The patient is a very pleasant 68 y.o. female in no acute distress. She appears well-developed and well-nourished and well groomed.   HEENT:Normocephalic, atraumatic, pupils are equal, round and reactive to light, tracking is normal. Hearing is grossly intact. Face is symmetric with normal facial animation and normal facial sensation. Speech is clear with no dysarthria noted. There is no hypophonia. There is no lip, neck/head, jaw or voice tremor. Neck with FROM. Oropharynx exam reveals: mildmouth dryness, adequatedental hygiene and moderateairway crowding.Tongue protrudes centrally and palate elevates symmetrically.   Chest:Clear to auscultation without wheezing, rhonchi or crackles noted.  Heart:S1+S2+0, regular slight systolic murmurs, not new, per pt.   Abdomen:Soft, non-tender and non-distended.  Extremities:There isnopitting edema in the  distal lower extremities bilaterally.   Skin: Warm and dry without trophic changes noted.  Musculoskeletal: exam reveals no obvious joint deformities, tenderness or joint swelling or erythema.   Neurologically:  Mental status: The patient is awake, alert and oriented in all 4 spheres.Herimmediate and remote memory, attention, language skills and fund of knowledge are appropriate. There is no evidence of aphasia, agnosia, apraxia or anomia. Speech is clear with normal prosody and enunciation. Thought process is linear. Mood is normaland affect is normal.  Cranial nerves II - XII are as described above under HEENT exam. Motor exam: Normal bulk, strength and tone is noted. There is no drift, tremor or rebound. Romberg is negative, no sway. Fine motor skills and coordination: intactgrossly. Cerebellar testing: No dysmetria or intention tremor. There is no truncal or gait ataxia.  Sensory exam: intact to light touch in the upper and lower extremities.  Gait, station and balance:Shestands easily. No veering to one side is noted. No leaning to one side is noted. Posture is age-appropriate and stance is narrow based. Gait showsnormalstride length and normalpace. No problems turning are noted. Tandem walk isgood.   Assessmentand Plan:  In summary,Damyia Joyceis a very pleasant 30 year oldfemalewith an underlying medical history of hypertension, hyperlipidemia, urge incontinence, reflux disease, degenerative disc disease of the lumbar spine, and overweight state, whopresents for follow-up consultation of her sleep disorder including RLS and OSA. She is fully compliant with CPAP therapy. She indicates ongoing good results. For her RLS she has been on a exercise routine, and particularly using resistance bands has helped tremendously. She has in the past tried and failed multiple medications for RLS, including gabapentin, Requip and Sinemet. She may not have tried Mirapex. She is fully  compliant with CPAP of 12 cm, we change her setting from AutoPap therapy to CPAP in August 2019. She reported that Ambien caused her restless legs to be worse. She is encouraged to continue with CPAP at a pressure of 12 cm. At this juncture, she is advised to follow-up in 1 year, she can see one of our nurse practitioners. She has lost some weight. We did blood work in May to rule out iron deficiency or anemia. She did not have any evidence of our deficiency.  I answered all her questions today and the patient was in agreement.  I spent 15 minutes in total face-to-face time with the patient, more than 50% of which was spent in counseling and coordination of care, reviewing test results, reviewing medication and discussing or reviewing the diagnosis of RLS, and OSA, the prognosis and treatment options. Pertinent laboratory and imaging test results that were available during this visit with the patient were reviewed by me and considered in my medical decision making (see chart for details).

## 2018-03-30 NOTE — Telephone Encounter (Signed)
Patient needs trilyte 

## 2018-03-30 NOTE — Patient Instructions (Addendum)
Please continue using your CPAP regularly. While your insurance requires that you use CPAP at least 4 hours each night on 70% of the nights, I recommend, that you not skip any nights and use it throughout the night if you can. Getting used to CPAP and staying with the treatment long term does take time and patience and discipline. Untreated obstructive sleep apnea when it is moderate to severe can have an adverse impact on cardiovascular health and raise her risk for heart disease, arrhythmias, hypertension, congestive heart failure, stroke and diabetes. Untreated obstructive sleep apnea causes sleep disruption, nonrestorative sleep, and sleep deprivation. This can have an impact on your day to day functioning and cause daytime sleepiness and impairment of cognitive function, memory loss, mood disturbance, and problems focussing. Using CPAP regularly can improve these symptoms.  You have done very well. Keep up the good work with the resistance band exercises, as it has helped your restless legs.   We can see you in 1 year, you can see one of our nurse practitioners as you are stable.

## 2018-03-31 MED ORDER — PEG 3350-KCL-NA BICARB-NACL 420 G PO SOLR: 4000.0000 mL | Freq: Once | ORAL | 0 refills | Status: AC

## 2018-04-03 NOTE — Progress Notes (Signed)
Subjective: CC: 3 month f/u DM2, OSA PCP: Janora Norlander, DO PNT:IRWERX Baxley is a 68 y.o. female presenting to clinic today for:  1. Type 2 Diabetes/ HLD/ HTN:  Diabetes hx: diagnosed with diabetes almost 15 years ago.  Significant weight loss from 192 pounds to her present weight.  No history of foot ulcerations.  She reports compliance with: Metformin 1019m BID, Lisinopril 262m Lipitor 4051mASA 22m59mSide effects: None.  FBG 99-130s (H:147).  Blood pressures tend to be around 130/80.  She reports that chronic, intermittent numbness in great toes bilaterally that is relieved by massaging feet/ calves.  Last eye exam: 09/2017. No retinopathy. She sees [facility] foot exam: DUE Last A1c:  Lab Results  Component Value Date   HGBA1C 7.6 (H) 01/04/2018  Nephropathy screen indicated?: on ACE-I Last flu, zoster and/or pneumovax: UTD  ROS: Denies LOC, polyuria, polydipsia, unintended weight loss/gain, foot ulcerations,or chest pain.   2.  Mild OSA/ Insomnia Patient continues to do well with Ambien 5 mg daily as needed sleep.  She denies any excessive daytime sedation, falls, respiratory depression.  She reports compliance with her CPAP machine.   ROS: Per HPI  No Known Allergies Past Medical History:  Diagnosis Date  . DDD (degenerative disc disease), lumbar   . GERD (gastroesophageal reflux disease)   . Hyperlipidemia   . Hypertension   . Urge incontinence of urine     Current Outpatient Medications:  .  Ascorbic Acid (VITAMIN C) 100 MG tablet, Take 500 mg by mouth daily., Disp: , Rfl:  .  aspirin 81 MG chewable tablet, Chew 81 mg by mouth 2 (two) times daily., Disp: , Rfl:  .  atorvastatin (LIPITOR) 40 MG tablet, TAKE 1 TABLET (40 MG TOTAL) BY MOUTH DAILY. FOR CHOLESTEROL, Disp: 90 tablet, Rfl: 1 .  Blood Glucose Monitoring Suppl (ONE TOUCH ULTRA 2) w/Device KIT, 1 KIT BY DOES NOT APPLY ROUTE ONCE FOR 1 DOSE., Disp: , Rfl: 0 .  cholecalciferol (VITAMIN  D) 1000 units tablet, Take 1,000 Units by mouth daily., Disp: , Rfl:  .  diclofenac (VOLTAREN) 75 MG EC tablet, Take 1 tablet (75 mg total) by mouth 2 (two) times daily as needed for moderate pain., Disp: 60 tablet, Rfl: 1 .  glucose blood test strip, Check blood glucose twice daily, Disp: 100 each, Rfl: 12 .  lisinopril (PRINIVIL,ZESTRIL) 20 MG tablet, TAKE 1 TABLET BY MOUTH EVERYDAY AT BEDTIME, Disp: 90 tablet, Rfl: 1 .  meclizine (ANTIVERT) 25 MG tablet, Take 0.5-1 tablets (12.5-25 mg total) by mouth 3 (three) times daily as needed for dizziness. (Patient not taking: Reported on 03/30/2018), Disp: 30 tablet, Rfl: 0 .  metFORMIN (GLUCOPHAGE) 1000 MG tablet, TAKE 1 TABLET (1,000 MG TOTAL) BY MOUTH 2 (TWO) TIMES DAILY WITH A MEAL., Disp: 180 tablet, Rfl: 0 .  metoprolol succinate (TOPROL-XL) 25 MG 24 hr tablet, Take 25 mg by mouth daily., Disp: , Rfl: 5 .  ONETOUCH DELICA LANCETS 30G 54MC, USE TO CHECK BLOOD SUGAR TWICE DAILY., Disp: 200 each, Rfl: 2 .  TURMERIC PO, Take 1 capsule by mouth., Disp: , Rfl:  .  zolpidem (AMBIEN) 5 MG tablet, Take 1 tablet (5 mg total) by mouth at bedtime as needed for sleep., Disp: 30 tablet, Rfl: 2 Social History   Socioeconomic History  . Marital status: Married    Spouse name: Not on file  . Number of children: Not on file  . Years of education: Not  on file  . Highest education level: Not on file  Occupational History  . Not on file  Social Needs  . Financial resource strain: Not on file  . Food insecurity:    Worry: Not on file    Inability: Not on file  . Transportation needs:    Medical: Not on file    Non-medical: Not on file  Tobacco Use  . Smoking status: Never Smoker  . Smokeless tobacco: Never Used  Substance and Sexual Activity  . Alcohol use: No  . Drug use: No  . Sexual activity: Not on file  Lifestyle  . Physical activity:    Days per week: Not on file    Minutes per session: Not on file  . Stress: Not on file  Relationships  .  Social connections:    Talks on phone: Not on file    Gets together: Not on file    Attends religious service: Not on file    Active member of club or organization: Not on file    Attends meetings of clubs or organizations: Not on file    Relationship status: Not on file  . Intimate partner violence:    Fear of current or ex partner: Not on file    Emotionally abused: Not on file    Physically abused: Not on file    Forced sexual activity: Not on file  Other Topics Concern  . Not on file  Social History Narrative  . Not on file   Family History  Problem Relation Age of Onset  . Cancer Mother        lung cancer  . Cancer Father        lymphnodes  . Diabetes Sister   . Diabetes Brother   . Diabetes Son   . Cancer Maternal Aunt        throat  . Cancer Paternal Uncle        unknown   . Cancer Maternal Grandfather   . Heart disease Paternal Grandfather   . Cancer Other        breast    Objective: Office vital signs reviewed. BP 140/81   Pulse 69   Temp 97.9 F (36.6 C) (Oral)   Ht 5' 5"  (1.651 m)   Wt 152 lb (68.9 kg)   BMI 25.29 kg/m   Physical Examination:  General: Awake, alert, well nourished, No acute distress HEENT: Normal    Eyes: extraocular membranes intact, sclera white    Throat: moist mucus membranes Cardio: regular rate and rhythm, S1S2 heard, no murmurs appreciated Pulm: clear to auscultation bilaterally, no wheezes, rhonchi or rales; normal work of breathing on room air Extremities: warm, well perfused, No edema, cyanosis or clubbing; +2 pulses bilaterally Neuro: see DM foot exam Diabetic Foot Exam - Simple   Simple Foot Form Diabetic Foot exam was performed with the following findings:  Yes 04/05/2018  8:51 AM  Visual Inspection No deformities, no ulcerations, no other skin breakdown bilaterally:  Yes Sensation Testing Intact to touch and monofilament testing bilaterally:  Yes Pulse Check Posterior Tibialis and Dorsalis pulse intact  bilaterally:  Yes Comments    Assessment/ Plan: 68 y.o. female   1. Type 2 diabetes mellitus with diabetic neuropathy, without long-term current use of insulin (HCC) Under excellent control today w/ A1c down to 5.9.  She never started Januvia.  Refills sent of Metformin. DM foot exam performed and was normal - CMP14+EGFR - Bayer DCA Hb A1c Waived - metFORMIN (GLUCOPHAGE)  1000 MG tablet; Take 1 tablet (1,000 mg total) by mouth 2 (two) times daily with a meal.  Dispense: 180 tablet; Refill: 1  2. Hyperlipidemia associated with type 2 diabetes mellitus (HCC) Check lipid. Lipitor refills sent - CMP14+EGFR - Lipid Panel - atorvastatin (LIPITOR) 40 MG tablet; Take 1 tablet (40 mg total) by mouth daily. For cholesterol  Dispense: 90 tablet; Refill: 1  3. Hypertension associated with diabetes ([place]) Controlled.  Continue Lisinopril - CMP14+EGFR - lisinopril (PRINIVIL,ZESTRIL) 20 MG tablet; TAKE 1 TABLET BY MOUTH EVERYDAY AT BEDTIME  Dispense: 90 tablet; Refill: 1  4. Mild obstructive sleep apnea Compliant w/ CPAP  5. Primary insomnia The Narcotic Database has been reviewed.  There were no red flags.  Last fill 2/14. Post dated rx sent.   - zolpidem (AMBIEN) 5 MG tablet; Take 1 tablet (5 mg total) by mouth at bedtime as needed for sleep.  Dispense: 30 tablet; Refill: 5   Orders Placed This Encounter  Procedures  . CMP14+EGFR  . Lipid Panel  . Bayer DCA Hb A1c Waived   Meds ordered this encounter  Medications  . atorvastatin (LIPITOR) 40 MG tablet    Sig: Take 1 tablet (40 mg total) by mouth daily. For cholesterol    Dispense:  90 tablet    Refill:  1  . lisinopril (PRINIVIL,ZESTRIL) 20 MG tablet    Sig: TAKE 1 TABLET BY MOUTH EVERYDAY AT BEDTIME    Dispense:  90 tablet    Refill:  1  . metFORMIN (GLUCOPHAGE) 1000 MG tablet    Sig: Take 1 tablet (1,000 mg total) by mouth 2 (two) times daily with a meal.    Dispense:  180 tablet    Refill:  1  . zolpidem (AMBIEN) 5 MG tablet      Sig: Take 1 tablet (5 mg total) by mouth at bedtime as needed for sleep.    Dispense:  30 tablet    Refill:  [place], [place] [phone]

## 2018-04-05 ENCOUNTER — Ambulatory Visit (INDEPENDENT_AMBULATORY_CARE_PROVIDER_SITE_OTHER): Payer: Medicare HMO | Admitting: Family Medicine

## 2018-04-05 VITALS — BP 140/81 | HR 69 | Temp 97.9°F | Ht 65.0 in | Wt 152.0 lb

## 2018-04-05 DIAGNOSIS — G4733 Obstructive sleep apnea (adult) (pediatric): Secondary | ICD-10-CM | POA: Diagnosis not present

## 2018-04-05 DIAGNOSIS — E1159 Type 2 diabetes mellitus with other circulatory complications: Secondary | ICD-10-CM | POA: Diagnosis not present

## 2018-04-05 DIAGNOSIS — I152 Hypertension secondary to endocrine disorders: Secondary | ICD-10-CM

## 2018-04-05 DIAGNOSIS — F5101 Primary insomnia: Secondary | ICD-10-CM

## 2018-04-05 DIAGNOSIS — E1140 Type 2 diabetes mellitus with diabetic neuropathy, unspecified: Secondary | ICD-10-CM

## 2018-04-05 DIAGNOSIS — I10 Essential (primary) hypertension: Secondary | ICD-10-CM | POA: Diagnosis not present

## 2018-04-05 DIAGNOSIS — E1169 Type 2 diabetes mellitus with other specified complication: Secondary | ICD-10-CM

## 2018-04-05 DIAGNOSIS — R69 Illness, unspecified: Secondary | ICD-10-CM | POA: Diagnosis not present

## 2018-04-05 DIAGNOSIS — E785 Hyperlipidemia, unspecified: Secondary | ICD-10-CM

## 2018-04-05 LAB — BAYER DCA HB A1C WAIVED: HB A1C (BAYER DCA - WAIVED): 5.9 % (ref <7.0)

## 2018-04-05 MED ORDER — ATORVASTATIN CALCIUM 40 MG PO TABS: 40.0000 mg | ORAL_TABLET | Freq: Every day | ORAL | 1 refills | Status: DC

## 2018-04-05 MED ORDER — LISINOPRIL 20 MG PO TABS: ORAL_TABLET | ORAL | 1 refills | Status: DC

## 2018-04-05 MED ORDER — ZOLPIDEM TARTRATE 5 MG PO TABS: 5.0000 mg | ORAL_TABLET | Freq: Every evening | ORAL | 5 refills | Status: DC | PRN

## 2018-04-05 MED ORDER — METFORMIN HCL 1000 MG PO TABS: 1000.0000 mg | ORAL_TABLET | Freq: Two times a day (BID) | ORAL | 1 refills | Status: DC

## 2018-04-05 NOTE — Patient Instructions (Signed)
Your sugar looks GREAT today.  Keep up the good work.  I sent in your refills.  We can plan to see each other again in 6 months.

## 2018-04-06 ENCOUNTER — Other Ambulatory Visit: Payer: Self-pay | Admitting: Family Medicine

## 2018-04-06 LAB — CMP14+EGFR
ALT: 26 IU/L (ref 0–32)
AST: 17 IU/L (ref 0–40)
Albumin/Globulin Ratio: 2.4 — ABNORMAL HIGH (ref 1.2–2.2)
Albumin: 4.8 g/dL (ref 3.8–4.8)
Alkaline Phosphatase: 46 IU/L (ref 39–117)
BUN/Creatinine Ratio: 19 (ref 12–28)
BUN: 16 mg/dL (ref 8–27)
Bilirubin Total: 1.5 mg/dL — ABNORMAL HIGH (ref 0.0–1.2)
CO2: 20 mmol/L (ref 20–29)
Calcium: 9.9 mg/dL (ref 8.7–10.3)
Chloride: 101 mmol/L (ref 96–106)
Creatinine, Ser: 0.83 mg/dL (ref 0.57–1.00)
GFR calc Af Amer: 85 mL/min/{1.73_m2} (ref >59)
GFR calc non Af Amer: 74 mL/min/{1.73_m2} (ref >59)
Globulin, Total: 2 g/dL (ref 1.5–4.5)
Glucose: 120 mg/dL — ABNORMAL HIGH (ref 65–99)
Potassium: 4.7 mmol/L (ref 3.5–5.2)
Sodium: 139 mmol/L (ref 134–144)
Total Protein: 6.8 g/dL (ref 6.0–8.5)

## 2018-04-06 LAB — LIPID PANEL
Chol/HDL Ratio: 3.3 ratio (ref 0.0–4.4)
Cholesterol, Total: 109 mg/dL (ref 100–199)
HDL: 33 mg/dL — ABNORMAL LOW (ref >39)
LDL CALC: 51 mg/dL (ref 0–99)
Triglycerides: 127 mg/dL (ref 0–149)
VLDL Cholesterol Cal: 25 mg/dL (ref 5–40)

## 2018-04-13 DIAGNOSIS — G4733 Obstructive sleep apnea (adult) (pediatric): Secondary | ICD-10-CM | POA: Diagnosis not present

## 2018-04-13 DIAGNOSIS — R69 Illness, unspecified: Secondary | ICD-10-CM | POA: Diagnosis not present

## 2018-04-16 DIAGNOSIS — G4733 Obstructive sleep apnea (adult) (pediatric): Secondary | ICD-10-CM | POA: Diagnosis not present

## 2018-04-30 DIAGNOSIS — R69 Illness, unspecified: Secondary | ICD-10-CM | POA: Diagnosis not present

## 2018-05-28 ENCOUNTER — Other Ambulatory Visit: Payer: Self-pay | Admitting: Family Medicine

## 2018-06-25 ENCOUNTER — Other Ambulatory Visit: Payer: Self-pay | Admitting: Family Medicine

## 2018-07-20 ENCOUNTER — Other Ambulatory Visit: Payer: Self-pay | Admitting: Family Medicine

## 2018-07-26 DIAGNOSIS — R69 Illness, unspecified: Secondary | ICD-10-CM | POA: Diagnosis not present

## 2018-08-02 ENCOUNTER — Encounter (INDEPENDENT_AMBULATORY_CARE_PROVIDER_SITE_OTHER): Payer: Self-pay | Admitting: *Deleted

## 2018-08-05 ENCOUNTER — Ambulatory Visit: Payer: Medicare HMO

## 2018-08-05 ENCOUNTER — Other Ambulatory Visit: Payer: Self-pay

## 2018-08-11 ENCOUNTER — Ambulatory Visit (INDEPENDENT_AMBULATORY_CARE_PROVIDER_SITE_OTHER): Payer: Medicare HMO | Admitting: Family Medicine

## 2018-08-11 DIAGNOSIS — K219 Gastro-esophageal reflux disease without esophagitis: Secondary | ICD-10-CM | POA: Diagnosis not present

## 2018-08-11 DIAGNOSIS — G4733 Obstructive sleep apnea (adult) (pediatric): Secondary | ICD-10-CM | POA: Diagnosis not present

## 2018-08-11 DIAGNOSIS — R110 Nausea: Secondary | ICD-10-CM

## 2018-08-11 MED ORDER — PANTOPRAZOLE SODIUM 40 MG PO TBEC: 40.0000 mg | DELAYED_RELEASE_TABLET | Freq: Every day | ORAL | 0 refills | Status: DC

## 2018-08-11 NOTE — Patient Instructions (Signed)

## 2018-08-11 NOTE — Progress Notes (Signed)
Telephone visit  Subjective: CC: nausea PCP: Janora Norlander, DO ONG:EXBMWU Candace Lopez is a 68 y.o. female calls for telephone consult today. Patient provides verbal consent for consult held via phone.  Location of patient: home Location of provider: Working remotely from home Others present for call: none  1. Nausea Patient reports couple month history of morning sickness.  She notes it does not happen every morning but it does happen most mornings.  It certainly happens every time she tries to go to yoga class.  She states she has a bitter or vitamin taste in her mouth.  Denies any vomiting, hematochezia or melena.  Bowel movements are normal.  She was taking to [facility], vitamin D and vitamin C which she is discontinued these over the last couple of weeks in efforts to see if perhaps her symptoms would improve.  She uses Voltaren but not very often.  No abdominal pain or jaundice.  She has colonoscopy scheduled for the end of August.   ROS: Per HPI  No Known Allergies Past Medical History:  Diagnosis Date  . DDD (degenerative disc disease), lumbar   . GERD (gastroesophageal reflux disease)   . Hyperlipidemia   . Hypertension   . Urge incontinence of urine     Current Outpatient Medications:  .  Ascorbic Acid (VITAMIN C) 100 MG tablet, Take 500 mg by mouth daily., Disp: , Rfl:  .  aspirin 81 MG chewable tablet, Chew 81 mg by mouth 2 (two) times daily., Disp: , Rfl:  .  atorvastatin (LIPITOR) 40 MG tablet, Take 1 tablet (40 mg total) by mouth daily. For cholesterol, Disp: 90 tablet, Rfl: 1 .  Blood Glucose Monitoring Suppl (ONE TOUCH ULTRA 2) w/Device KIT, 1 KIT BY DOES NOT APPLY ROUTE ONCE FOR 1 DOSE., Disp: , Rfl: 0 .  cholecalciferol (VITAMIN D) 1000 units tablet, Take 1,000 Units by mouth daily., Disp: , Rfl:  .  diclofenac (VOLTAREN) 75 MG EC tablet, TAKE 1 TABLET BY MOUTH 2 (TWO) TIMES DAILY AS NEEDED FOR MODERATE PAIN., Disp: 60 tablet, Rfl: 3 .  glucose blood test strip,  Check blood glucose twice daily, Disp: 100 each, Rfl: 12 .  lisinopril (PRINIVIL,ZESTRIL) 20 MG tablet, TAKE 1 TABLET BY MOUTH EVERYDAY AT BEDTIME, Disp: 90 tablet, Rfl: 1 .  metFORMIN (GLUCOPHAGE) 1000 MG tablet, Take 1 tablet (1,000 mg total) by mouth 2 (two) times daily with a meal., Disp: 180 tablet, Rfl: 1 .  metoprolol succinate (TOPROL-XL) 25 MG 24 hr tablet, Take 25 mg by mouth daily., Disp: , Rfl: 5 .  ONETOUCH DELICA LANCETS 13K MISC, USE TO CHECK BLOOD SUGAR TWICE DAILY., Disp: 200 each, Rfl: 2 .  TURMERIC PO, Take 1 capsule by mouth., Disp: , Rfl:  .  zolpidem (AMBIEN) 5 MG tablet, Take 1 tablet (5 mg total) by mouth at bedtime as needed for sleep., Disp: 30 tablet, Rfl: 5  Assessment/ Plan: 68 y.o. female   1. Nausea I wonder if this is GERD related.  Nothing to suggest an inner ear issue based on her history.  She does have a history of GERD and is not on any PPI.  She recently discontinued her tumor rec.  Try Protonix for 2 to 3 weeks and if no significant improvement plan to reach out to GI and see if perhaps they can do an EGD along with her colonoscopy at the end of August.  She understands red flag signs and symptoms.  She will follow-up PRN - pantoprazole (PROTONIX)  40 MG tablet; Take 1 tablet (40 mg total) by mouth daily.  Dispense: 30 tablet; Refill: 0  2. Gastroesophageal reflux disease without esophagitis - pantoprazole (PROTONIX) 40 MG tablet; Take 1 tablet (40 mg total) by mouth daily.  Dispense: 30 tablet; Refill: 0   Start time: 8:30am End time: 8:38am  Total time spent on patient care (including telephone call/ virtual visit): 14 minutes  [place], [place] [phone]

## 2018-08-19 ENCOUNTER — Other Ambulatory Visit: Payer: Self-pay | Admitting: Family Medicine

## 2018-08-19 DIAGNOSIS — R110 Nausea: Secondary | ICD-10-CM

## 2018-08-19 DIAGNOSIS — K219 Gastro-esophageal reflux disease without esophagitis: Secondary | ICD-10-CM

## 2018-08-19 DIAGNOSIS — R69 Illness, unspecified: Secondary | ICD-10-CM | POA: Diagnosis not present

## 2018-08-24 ENCOUNTER — Telehealth (INDEPENDENT_AMBULATORY_CARE_PROVIDER_SITE_OTHER): Payer: Self-pay | Admitting: *Deleted

## 2018-08-24 NOTE — Telephone Encounter (Signed)
Referring MD/PCP: gottschalk   Procedure: tcs  Reason/Indication:  screening  Has patient had this procedure before?  no  If so, when, by whom and where?    Is there a family history of colon cancer?  no  Who?  What age when diagnosed?    Is patient diabetic?   yes      Does patient have prosthetic heart valve or mechanical valve?  no  Do you have a pacemaker?  no  Has patient ever had endocarditis? no  Has patient had joint replacement within last 12 months?  no  Is patient constipated or do they take laxatives? no  Does patient have a history of alcohol/drug use?  no  Is patient on blood thinner such as Coumadin, Plavix and/or Aspirin? yes  Medications: lisinopril 20 mg daily, metformin 1000 mg bid, atorvastatin 40 mg daily, metoprolol 25 mg daily, zolpidem 5 mg prn, vit d 400 iu daily, asa 81 mg bid, vit c 500 mg daily, turmeric 200 mg bid, diclofenac 75 mg bid  Allergies: nkda  Medication Adjustment per Dr Lindi Adie, NP: asa 2 days, hold metformin evening before and morning of  Procedure date & time: 09/22/18 at 1030

## 2018-08-25 NOTE — Telephone Encounter (Signed)
agree

## 2018-08-30 ENCOUNTER — Other Ambulatory Visit: Payer: Self-pay | Admitting: Family Medicine

## 2018-08-30 DIAGNOSIS — E1169 Type 2 diabetes mellitus with other specified complication: Secondary | ICD-10-CM

## 2018-09-09 ENCOUNTER — Telehealth: Payer: Self-pay | Admitting: Family Medicine

## 2018-09-09 NOTE — Telephone Encounter (Signed)
Patient states that she went to outdoors yoga class.  Patient states that every time she goes outside her face get red, hot and swelling. Patient uses night cream.  Patient is wondering if there is something she can use with the cpap. No changes to toiletries, laundry detergent, fabric softners.

## 2018-09-12 ENCOUNTER — Other Ambulatory Visit: Payer: Self-pay | Admitting: Physician Assistant

## 2018-09-12 MED ORDER — DESONIDE 0.05 % EX LOTN: TOPICAL_LOTION | Freq: Two times a day (BID) | CUTANEOUS | 0 refills | Status: DC

## 2018-09-12 NOTE — Telephone Encounter (Signed)
Dr Lajuana Ripple is off tomorrow. I got this late on Thurday and I was off Friday.  Can she use a gentle cleanser like cetaphil?  And I will send in a mild steroid cream for the face. Use twice daily for about a week. If not improved, call back.

## 2018-09-12 NOTE — Telephone Encounter (Signed)
forwarding

## 2018-09-13 NOTE — Telephone Encounter (Signed)
Patient states that she will not need any rx. She thinks that she got some flex seal spray on her face and that's what made it burn. She has that cleared up now and is fine. She wants you to know that she has a knot on her head that she is worried about. I offered her an appt but patient declined. She is worried about Covid. She also wants to know if its ok with you if she puts off her colonoscopy a little longer because she is afraid to go in to the hospital. Please review and advise

## 2018-09-14 NOTE — Telephone Encounter (Signed)
I would not recommend continuing to put off the colonoscopy due to fear of COVID19. Undiagnosed colon cancer poses a greater risk.  Please have her keep her colonoscopy appt.  If she has a knot she is worried about, I'd recommend getting it checked out.

## 2018-09-14 NOTE — Telephone Encounter (Signed)
Patient aware.

## 2018-09-20 ENCOUNTER — Other Ambulatory Visit (HOSPITAL_COMMUNITY)
Admission: RE | Admit: 2018-09-20 | Discharge: 2018-09-20 | Disposition: A | Discharge status: Home / Self Care | Payer: Medicare HMO | Source: Ambulatory Visit | Attending: Internal Medicine | Admitting: Internal Medicine

## 2018-09-20 ENCOUNTER — Other Ambulatory Visit: Payer: Self-pay

## 2018-09-20 DIAGNOSIS — Z20828 Contact with and (suspected) exposure to other viral communicable diseases: Secondary | ICD-10-CM | POA: Insufficient documentation

## 2018-09-20 DIAGNOSIS — Z01812 Encounter for preprocedural laboratory examination: Secondary | ICD-10-CM | POA: Diagnosis not present

## 2018-09-21 LAB — SARS CORONAVIRUS 2 (TAT 6-24 HRS): SARS Coronavirus 2: NEGATIVE

## 2018-09-22 ENCOUNTER — Ambulatory Visit (HOSPITAL_COMMUNITY)
Admission: RE | Admit: 2018-09-22 | Discharge: 2018-09-22 | Disposition: A | Discharge status: Home / Self Care | Payer: Medicare HMO | Attending: Internal Medicine | Admitting: Internal Medicine

## 2018-09-22 ENCOUNTER — Other Ambulatory Visit: Payer: Self-pay

## 2018-09-22 ENCOUNTER — Encounter (HOSPITAL_COMMUNITY): Payer: Self-pay | Admitting: *Deleted

## 2018-09-22 ENCOUNTER — Encounter (HOSPITAL_COMMUNITY)
Admission: RE | Disposition: A | Discharge status: Home / Self Care | Payer: Self-pay | Source: Home / Self Care | Attending: Internal Medicine

## 2018-09-22 DIAGNOSIS — I10 Essential (primary) hypertension: Secondary | ICD-10-CM | POA: Diagnosis not present

## 2018-09-22 DIAGNOSIS — Z801 Family history of malignant neoplasm of trachea, bronchus and lung: Secondary | ICD-10-CM | POA: Insufficient documentation

## 2018-09-22 DIAGNOSIS — K5730 Diverticulosis of large intestine without perforation or abscess without bleeding: Secondary | ICD-10-CM | POA: Insufficient documentation

## 2018-09-22 DIAGNOSIS — Z803 Family history of malignant neoplasm of breast: Secondary | ICD-10-CM | POA: Diagnosis not present

## 2018-09-22 DIAGNOSIS — E785 Hyperlipidemia, unspecified: Secondary | ICD-10-CM | POA: Diagnosis not present

## 2018-09-22 DIAGNOSIS — Z7984 Long term (current) use of oral hypoglycemic drugs: Secondary | ICD-10-CM | POA: Insufficient documentation

## 2018-09-22 DIAGNOSIS — Z800 Family history of malignant neoplasm of digestive organs: Secondary | ICD-10-CM | POA: Insufficient documentation

## 2018-09-22 DIAGNOSIS — Z807 Family history of other malignant neoplasms of lymphoid, hematopoietic and related tissues: Secondary | ICD-10-CM | POA: Diagnosis not present

## 2018-09-22 DIAGNOSIS — Z833 Family history of diabetes mellitus: Secondary | ICD-10-CM | POA: Insufficient documentation

## 2018-09-22 DIAGNOSIS — Z791 Long term (current) use of non-steroidal anti-inflammatories (NSAID): Secondary | ICD-10-CM | POA: Diagnosis not present

## 2018-09-22 DIAGNOSIS — N3941 Urge incontinence: Secondary | ICD-10-CM | POA: Diagnosis not present

## 2018-09-22 DIAGNOSIS — Z7982 Long term (current) use of aspirin: Secondary | ICD-10-CM | POA: Insufficient documentation

## 2018-09-22 DIAGNOSIS — Z888 Allergy status to other drugs, medicaments and biological substances status: Secondary | ICD-10-CM | POA: Diagnosis not present

## 2018-09-22 DIAGNOSIS — M5136 Other intervertebral disc degeneration, lumbar region: Secondary | ICD-10-CM | POA: Diagnosis not present

## 2018-09-22 DIAGNOSIS — Z79899 Other long term (current) drug therapy: Secondary | ICD-10-CM | POA: Insufficient documentation

## 2018-09-22 DIAGNOSIS — Z8249 Family history of ischemic heart disease and other diseases of the circulatory system: Secondary | ICD-10-CM | POA: Insufficient documentation

## 2018-09-22 DIAGNOSIS — E119 Type 2 diabetes mellitus without complications: Secondary | ICD-10-CM | POA: Diagnosis not present

## 2018-09-22 DIAGNOSIS — K219 Gastro-esophageal reflux disease without esophagitis: Secondary | ICD-10-CM | POA: Diagnosis not present

## 2018-09-22 DIAGNOSIS — Z1211 Encounter for screening for malignant neoplasm of colon: Secondary | ICD-10-CM | POA: Insufficient documentation

## 2018-09-22 DIAGNOSIS — Z90710 Acquired absence of both cervix and uterus: Secondary | ICD-10-CM | POA: Diagnosis not present

## 2018-09-22 HISTORY — PX: COLONOSCOPY: SHX5424

## 2018-09-22 HISTORY — DX: Type 2 diabetes mellitus without complications: E11.9

## 2018-09-22 LAB — GLUCOSE, CAPILLARY: Glucose-Capillary: 106 mg/dL — ABNORMAL HIGH (ref 70–99)

## 2018-09-22 SURGERY — COLONOSCOPY
Anesthesia: Moderate Sedation

## 2018-09-22 MED ORDER — MIDAZOLAM HCL 5 MG/5ML IJ SOLN
INTRAMUSCULAR | Status: AC
  Filled 2018-09-22: qty 10

## 2018-09-22 MED ORDER — MIDAZOLAM HCL 5 MG/5ML IJ SOLN
INTRAMUSCULAR | Status: DC | PRN
  Administered 2018-09-22 (×2): 2 mg via INTRAVENOUS
  Administered 2018-09-22: 1 mg via INTRAVENOUS
  Administered 2018-09-22: 2 mg via INTRAVENOUS

## 2018-09-22 MED ORDER — MEPERIDINE HCL 50 MG/ML IJ SOLN
INTRAMUSCULAR | Status: AC
  Filled 2018-09-22: qty 1

## 2018-09-22 MED ORDER — MEPERIDINE HCL 50 MG/ML IJ SOLN
INTRAMUSCULAR | Status: DC | PRN
  Administered 2018-09-22 (×2): 25 mg

## 2018-09-22 MED ORDER — SODIUM CHLORIDE 0.9 % IV SOLN
INTRAVENOUS | Status: DC
  Administered 2018-09-22: 10:00:00 via INTRAVENOUS

## 2018-09-22 NOTE — Op Note (Signed)
[facility] Patient Name: Candace Lopez Procedure Date: 09/22/2018 9:55 AM MRN: [id number] Date of Birth: 26-Sep-1950 Attending MD: Hildred Laser , MD CSN: [id number] Age: 68 Admit Type: Outpatient Procedure:                Colonoscopy Indications:              Screening for colorectal malignant neoplasm Providers:                Hildred Laser, MD, Janeece Riggers, RN, Raphael Gibney,                            Technician Referring MD:             Koleen Distance. Gottschalk, DO Medicines:                Meperidine 50 mg IV, Midazolam 8 mg IV Complications:            No immediate complications. Estimated Blood Loss:     Estimated blood loss: none. Procedure:                Pre-Anesthesia Assessment:                           - Prior to the procedure, a History and Physical                            was performed, and patient medications and                            allergies were reviewed. The patient's tolerance of                            previous anesthesia was also reviewed. The risks                            and benefits of the procedure and the sedation                            options and risks were discussed with the patient.                            All questions were answered, and informed consent                            was obtained. Prior Anticoagulants: The patient has                            taken no previous anticoagulant or antiplatelet                            agents except for aspirin and has taken no previous                            anticoagulant or antiplatelet agents except for  NSAID medication. ASA Grade Assessment: II - A                            patient with mild systemic disease. After reviewing                            the risks and benefits, the patient was deemed in                            satisfactory condition to undergo the procedure.                           After obtaining informed consent, the colonoscope                             was passed under direct vision. Throughout the                            procedure, the patient's blood pressure, pulse, and                            oxygen saturations were monitored continuously. The                            PCF-[id number] ([id number]) scope was introduced through                            the anus and advanced to the the cecum, identified                            by appendiceal orifice and ileocecal valve. The                            colonoscopy was performed without difficulty. The                            patient tolerated the procedure well. The quality                            of the bowel preparation was excellent. The                            ileocecal valve, appendiceal orifice, and rectum                            were photographed. Scope In: 10:16:20 AM Scope Out: 10:38:50 AM Scope Withdrawal Time: 0 hours 12 minutes 46 seconds  Total Procedure Duration: 0 hours 22 minutes 30 seconds  Findings:      The perianal and digital rectal examinations were normal.      Scattered diverticula were found in the sigmoid colon.      The exam was otherwise normal throughout the examined colon.      The retroflexed view of the distal rectum and anal verge was normal and  showed no anal or rectal abnormalities. Impression:               - Diverticulosis in the sigmoid colon.                           - No specimens collected. Moderate Sedation:      Moderate (conscious) sedation was administered by the endoscopy nurse       and supervised by the endoscopist. The following parameters were       monitored: oxygen saturation, heart rate, blood pressure, CO2       capnography and response to care. Total physician intraservice time was       28 minutes. Recommendation:           - Patient has a contact number available for                            emergencies. The signs and symptoms of potential                            delayed  complications were discussed with the                            patient. Return to normal activities tomorrow.                            Written discharge instructions were provided to the                            patient.                           - High fiber diet.                           - Continue present medications.                           - Repeat colonoscopy in 10 years for screening                            purposes. Procedure Code(s):        --- Professional ---                           [id number], Colonoscopy, flexible; diagnostic, including                            collection of specimen(s) by brushing or washing,                            when performed (separate procedure)                           99153, Moderate sedation; each additional 15                            minutes intraservice time  G0500, Moderate sedation services provided by the                            same physician or other qualified health care                            professional performing a gastrointestinal                            endoscopic service that sedation supports,                            requiring the presence of an independent trained                            observer to assist in the monitoring of the                            patient's level of consciousness and physiological                            status; initial 15 minutes of intra-service time;                            patient age 107 years or older (additional time may                            be reported with [id number], as appropriate) Diagnosis Code(s):        --- Professional ---                           Z12.11, Encounter for screening for malignant                            neoplasm of colon                           K57.30, Diverticulosis of large intestine without                            perforation or abscess without bleeding CPT copyright 2019 [employer].  All rights reserved. The codes documented in this report are preliminary and upon coder review may  be revised to meet current compliance requirements. Lionel DecemberNajeeb Rehman, MD Lionel DecemberNajeeb Rehman, MD 09/22/2018 10:46:55 AM This report has been signed electronically. Number of Addenda: 0

## 2018-09-22 NOTE — H&P (Addendum)
Candace Lopez is an 68 y.o. female.   Chief Complaint: Patient is here for colonoscopy. HPI: Patient 68 year old Caucasian female who is here for screening colonoscopy.  She denies abdominal pain change in bowel habits or rectal bleeding.  She believes her last colonoscopy was 15 years ago and she may have had small polyps.  However she does not remember if the recall was in 5 to 10 years. Family history is negative for CRC.  Past Medical History:  Diagnosis Date  . DDD (degenerative disc disease), lumbar   . Diabetes mellitus without complication ([place])   . GERD (gastroesophageal reflux disease)   . Hyperlipidemia   . Hypertension   . Urge incontinence of urine     Past Surgical History:  Procedure Laterality Date  . ABDOMINAL HYSTERECTOMY    . arm surgery    . FOOT SURGERY    . KNEE SURGERY      Family History  Problem Relation Age of Onset  . Cancer Mother        lung cancer  . Cancer Father        lymphnodes  . Diabetes Sister   . Diabetes Brother   . Diabetes Son   . Cancer Maternal Aunt        throat  . Cancer Paternal Uncle        unknown   . Cancer Maternal Grandfather   . Heart disease Paternal Grandfather   . Cancer Other        breast  . Colon cancer Neg Hx    Social History:  reports that she has never smoked. She has never used smokeless tobacco. She reports that she does not drink alcohol or use drugs.  Allergies:  Allergies  Allergen Reactions  . Tape Rash    Medications Prior to Admission  Medication Sig Dispense Refill  . aspirin 81 MG chewable tablet Chew 81 mg by mouth 2 (two) times daily.    Marland Kitchen atorvastatin (LIPITOR) 40 MG tablet TAKE 1 TABLET BY MOUTH EVERY DAY FOR CHOLESTEROL (Patient taking differently: Take 40 mg by mouth at bedtime. ) 90 tablet 0  . cholecalciferol (VITAMIN D) 1000 units tablet Take 1,000 Units by mouth daily.    . diclofenac (VOLTAREN) 75 MG EC tablet TAKE 1 TABLET BY MOUTH 2 (TWO) TIMES DAILY AS NEEDED FOR MODERATE  PAIN. (Patient taking differently: Take 75 mg by mouth 2 (two) times daily as needed for moderate pain. ) 60 tablet 3  . GAVILYTE-N WITH FLAVOR PACK 420 g solution See admin instructions.    Marland Kitchen lisinopril (PRINIVIL,ZESTRIL) 20 MG tablet TAKE 1 TABLET BY MOUTH EVERYDAY AT BEDTIME (Patient taking differently: Take 20 mg by mouth at bedtime. ) 90 tablet 1  . metFORMIN (GLUCOPHAGE) 1000 MG tablet Take 1 tablet (1,000 mg total) by mouth 2 (two) times daily with a meal. 180 tablet 1  . metoprolol succinate (TOPROL-XL) 25 MG 24 hr tablet Take 25 mg by mouth at bedtime.   5  . pantoprazole (PROTONIX) 40 MG tablet TAKE 1 TABLET BY MOUTH EVERY DAY (Patient taking differently: Take 40 mg by mouth daily with breakfast. ) 90 tablet 0  . TURMERIC PO Take 1 capsule by mouth 2 (two) times daily.     Marland Kitchen zolpidem (AMBIEN) 5 MG tablet Take 1 tablet (5 mg total) by mouth at bedtime as needed for sleep. 30 tablet 5  . Blood Glucose Monitoring Suppl (ONE TOUCH ULTRA 2) w/Device KIT 1 KIT BY DOES NOT  APPLY ROUTE ONCE FOR 1 DOSE.  0  . glucose blood (ONETOUCH ULTRA) test strip Check blood sugar twice daily Dx 11.40 200 each 3  . ONETOUCH DELICA LANCETS 49Q MISC USE TO CHECK BLOOD SUGAR TWICE DAILY. 200 each 2    Results for orders placed or performed during the hospital encounter of 09/22/18 (from the past 48 hour(s))  Glucose, capillary     Status: Abnormal   Collection Time: 09/22/18  9:34 AM  Result Value Ref Range   Glucose-Capillary 106 (H) 70 - 99 mg/dL   No results found.  ROS  Blood pressure (!) 174/75, pulse 81, temperature 98.4 F (36.9 C), temperature source Oral, resp. rate 15, height _0  (1.651 m), weight 70.3 kg, SpO2 100 %. Physical Exam  Constitutional: She appears well-developed and well-nourished.  HENT:  Mouth/Throat: Oropharynx is clear and moist.  Eyes: Conjunctivae are normal. No scleral icterus.  Neck: No thyromegaly present.  Cardiovascular: Normal rate and regular rhythm.  Murmur  heard. Grade 2/6 systolic ejection murmur best heard at aortic area.  Respiratory: Effort normal and breath sounds normal.  GI:  Pfannenstiel scar.  Abdomen is soft and nontender with organomegaly or masses.  Musculoskeletal:        General: No edema.  Lymphadenopathy:    She has no cervical adenopathy.  Neurological: She is alert.  Skin: Skin is warm.     Assessment/Plan Average risk screening colonoscopy.  Hildred Laser, MD 09/22/2018, 10:06 AM

## 2018-09-22 NOTE — Discharge Instructions (Signed)
Resume usual medications including low-dose aspirin as before. High-fiber diet. No driving for 24 hours. Next screening exam in 10 years.   Colonoscopy, Adult, Care After This sheet gives you information about how to care for yourself after your procedure. Your doctor may also give you more specific instructions. If you have problems or questions, call your doctor. What can I expect after the procedure? After the procedure, it is common to have:  A small amount of blood in your poop for 24 hours.  Some gas.  Mild cramping or bloating in your belly. Follow these instructions at home: General instructions  For the first 24 hours after the procedure: ? Do not drive or use machinery. ? Do not sign important documents. ? Do not drink alcohol. ? Do your daily activities more slowly than normal. ? Eat foods that are soft and easy to digest.  Take over-the-counter or prescription medicines only as told by your doctor. To help cramping and bloating:   Try walking around.  Put heat on your belly (abdomen) as told by your doctor. Use a heat source that your doctor recommends, such as a moist heat pack or a heating pad. ? Put a towel between your skin and the heat source. ? Leave the heat on for 20-30 minutes. ? Remove the heat if your skin turns bright red. This is especially important if you cannot feel pain, heat, or cold. You can get burned. Eating and drinking   Drink enough fluid to keep your pee (urine) clear or pale yellow.  Return to your normal diet as told by your doctor. Avoid heavy or fried foods that are hard to digest.  Avoid drinking alcohol for as long as told by your doctor. Contact a doctor if:  You have blood in your poop (stool) 2-3 days after the procedure. Get help right away if:  You have more than a small amount of blood in your poop.  You see large clumps of tissue (blood clots) in your poop.  Your belly is swollen.  You feel sick to your stomach  (nauseous).  You throw up (vomit).  You have a fever.  You have belly pain that gets worse, and medicine does not help your pain. Summary  After the procedure, it is common to have a small amount of blood in your poop. You may also have mild cramping and bloating in your belly.  For the first 24 hours after the procedure, do not drive or use machinery, do not sign important documents, and do not drink alcohol.  Get help right away if you have a lot of blood in your poop, feel sick to your stomach, have a fever, or have more belly pain. This information is not intended to replace advice given to you by your health care provider. Make sure you discuss any questions you have with your health care provider. Document Released: 02/15/2010 Document Revised: 11/13/2016 Document Reviewed: 10/08/2015 Elsevier Patient Education  2020 Else[employer] Inc.  High-Fiber Diet Fiber, also called dietary fiber, is a type of carbohydrate that is found in fruits, vegetables, whole grains, and beans. A high-fiber diet can have many health benefits. Your health care provider may recommend a high-fiber diet to help:  Prevent constipation. Fiber can make your bowel movements more regular.  Lower your cholesterol.  Relieve the following conditions: ? Swelling of veins in the anus (hemorrhoids). ? Swelling and irritation (inflammation) of specific areas of the digestive tract (uncomplicated diverticulosis). ? A problem of the large  intestine (colon) that sometimes causes pain and diarrhea (irritable bowel syndrome, IBS).  Prevent overeating as part of a weight-loss plan.  Prevent heart disease, type 2 diabetes, and certain cancers. What is my plan? The recommended daily fiber intake in grams (g) includes:  38 g for men age 67 or younger.  30 g for men over age 67.  25 g for women age 67 or younger.  21 g for women over age 67. You can get the recommended daily intake of dietary fiber by:  Eating a  variety of fruits, vegetables, grains, and beans.  Taking a fiber supplement, if it is not possible to get enough fiber through your diet. What do I need to know about a high-fiber diet?  It is better to get fiber through food sources rather than from fiber supplements. There is not a lot of research about how effective supplements are.  Always check the fiber content on the nutrition facts label of any prepackaged food. Look for foods that contain 5 g of fiber or more per serving.  Talk with a diet and nutrition specialist (dietitian) if you have questions about specific foods that are recommended or not recommended for your medical condition, especially if those foods are not listed below.  Gradually increase how much fiber you consume. If you increase your intake of dietary fiber too quickly, you may have bloating, cramping, or gas.  Drink plenty of water. Water helps you to digest fiber. What are tips for following this plan?  Eat a wide variety of high-fiber foods.  Make sure that half of the grains that you eat each day are whole grains.  Eat breads and cereals that are made with whole-grain flour instead of refined flour or white flour.  Eat brown rice, bulgur wheat, or millet instead of white rice.  Start the day with a breakfast that is high in fiber, such as a cereal that contains 5 g of fiber or more per serving.  Use beans in place of meat in soups, salads, and pasta dishes.  Eat high-fiber snacks, such as berries, raw vegetables, nuts, and popcorn.  Choose whole fruits and vegetables instead of processed forms like juice or sauce. What foods can I eat?  Fruits Berries. Pears. Apples. Oranges. Avocado. Prunes and raisins. Dried figs. Vegetables Sweet potatoes. Spinach. Kale. Artichokes. Cabbage. Broccoli. Cauliflower. Green peas. Carrots. Squash. Grains Whole-grain breads. Multigrain cereal. Oats and oatmeal. Brown rice. Barley. Bulgur wheat. Millet. Quinoa. Bran  muffins. Popcorn. Rye wafer crackers. Meats and other proteins Navy, kidney, and pinto beans. Soybeans. Split peas. Lentils. Nuts and seeds. Dairy Fiber-fortified yogurt. Beverages Fiber-fortified soy milk. Fiber-fortified orange juice. Other foods Fiber bars. The items listed above may not be a complete list of recommended foods and beverages. Contact a dietitian for more options. What foods are not recommended? Fruits Fruit juice. Cooked, strained fruit. Vegetables Fried potatoes. Canned vegetables. Well-cooked vegetables. Grains White bread. Pasta made with refined flour. White rice. Meats and other proteins Fatty cuts of meat. Fried chicken or fried fish. Dairy Milk. Yogurt. Cream cheese. Sour cream. Fats and oils Butters. Beverages Soft drinks. Other foods Cakes and pastries. The items listed above may not be a complete list of foods and beverages to avoid. Contact a dietitian for more information. Summary  Fiber is a type of carbohydrate. It is found in fruits, vegetables, whole grains, and beans.  There are many health benefits of eating a high-fiber diet, such as preventing constipation, lowering blood cholesterol, helping  with weight loss, and reducing your risk of heart disease, diabetes, and certain cancers.  Gradually increase your intake of fiber. Increasing too fast can result in cramping, bloating, and gas. Drink plenty of water while you increase your fiber.  The best sources of fiber include whole fruits and vegetables, whole grains, nuts, seeds, and beans. This information is not intended to replace advice given to you by your health care provider. Make sure you discuss any questions you have with your health care provider. Document Released: 01/13/2005 Document Revised: 11/17/2016 Document Reviewed: 11/17/2016 Elsevier Patient Education  2020 [employer].  Diverticulosis  Diverticulosis is a condition that develops when small pouches (diverticula) form  in the wall of the large intestine (colon). The colon is where water is absorbed and stool is formed. The pouches form when the inside layer of the colon pushes through weak spots in the outer layers of the colon. You may have a few pouches or many of them. What are the causes? The cause of this condition is not known. What increases the risk? The following factors may make you more likely to develop this condition:  Being older than age 64. Your risk for this condition increases with age. Diverticulosis is rare among people younger than age 22. By age 69, many people have it.  Eating a low-fiber diet.  Having frequent constipation.  Being overweight.  Not getting enough exercise.  Smoking.  Taking over-the-counter pain medicines, like aspirin and ibuprofen.  Having a family history of diverticulosis. What are the signs or symptoms? In most people, there are no symptoms of this condition. If you do have symptoms, they may include:  Bloating.  Cramps in the abdomen.  Constipation or diarrhea.  Pain in the lower left side of the abdomen. How is this diagnosed? This condition is most often diagnosed during an exam for other colon problems. Because diverticulosis usually has no symptoms, it often cannot be diagnosed independently. This condition may be diagnosed by:  Using a flexible scope to examine the colon (colonoscopy).  Taking an X-ray of the colon after dye has been put into the colon (barium enema).  Doing a CT scan. How is this treated? You may not need treatment for this condition if you have never developed an infection related to diverticulosis. If you have had an infection before, treatment may include:  Eating a high-fiber diet. This may include eating more fruits, vegetables, and grains.  Taking a fiber supplement.  Taking a live bacteria supplement (probiotic).  Taking medicine to relax your colon.  Taking antibiotic medicines. Follow these instructions  at home:  Drink 6-8 glasses of water or more each day to prevent constipation.  Try not to strain when you have a bowel movement.  If you have had an infection before: ? Eat more fiber as directed by your health care provider or your diet and nutrition specialist (dietitian). ? Take a fiber supplement or probiotic, if your health care provider approves.  Take over-the-counter and prescription medicines only as told by your health care provider.  If you were prescribed an antibiotic, take it as told by your health care provider. Do not stop taking the antibiotic even if you start to feel better.  Keep all follow-up visits as told by your health care provider. This is important. Contact a health care provider if:  You have pain in your abdomen.  You have bloating.  You have cramps.  You have not had a bowel movement in 3 days.  Get help right away if:  Your pain gets worse.  Your bloating becomes very bad.  You have a fever or chills, and your symptoms suddenly get worse.  You vomit.  You have bowel movements that are bloody or black.  You have bleeding from your rectum. Summary  Diverticulosis is a condition that develops when small pouches (diverticula) form in the wall of the large intestine (colon).  You may have a few pouches or many of them.  This condition is most often diagnosed during an exam for other colon problems.  If you have had an infection related to diverticulosis, treatment may include increasing the fiber in your diet, taking supplements, or taking medicines. This information is not intended to replace advice given to you by your health care provider. Make sure you discuss any questions you have with your health care provider. Document Released: 10/11/2003 Document Revised: 12/26/2016 Document Reviewed: 12/03/2015 Elsevier Patient Education  2020 [employer].

## 2018-09-27 ENCOUNTER — Other Ambulatory Visit: Payer: Self-pay | Admitting: Family Medicine

## 2018-09-27 ENCOUNTER — Encounter (HOSPITAL_COMMUNITY): Payer: Self-pay | Admitting: Internal Medicine

## 2018-09-27 DIAGNOSIS — I152 Hypertension secondary to endocrine disorders: Secondary | ICD-10-CM

## 2018-09-27 DIAGNOSIS — E1159 Type 2 diabetes mellitus with other circulatory complications: Secondary | ICD-10-CM

## 2018-10-06 ENCOUNTER — Ambulatory Visit (INDEPENDENT_AMBULATORY_CARE_PROVIDER_SITE_OTHER): Payer: Medicare HMO | Admitting: Family Medicine

## 2018-10-06 ENCOUNTER — Encounter: Payer: Self-pay | Admitting: Family Medicine

## 2018-10-06 VITALS — BP 139/63

## 2018-10-06 DIAGNOSIS — I10 Essential (primary) hypertension: Secondary | ICD-10-CM

## 2018-10-06 DIAGNOSIS — K219 Gastro-esophageal reflux disease without esophagitis: Secondary | ICD-10-CM

## 2018-10-06 DIAGNOSIS — E1169 Type 2 diabetes mellitus with other specified complication: Secondary | ICD-10-CM

## 2018-10-06 DIAGNOSIS — E785 Hyperlipidemia, unspecified: Secondary | ICD-10-CM

## 2018-10-06 DIAGNOSIS — E1140 Type 2 diabetes mellitus with diabetic neuropathy, unspecified: Secondary | ICD-10-CM

## 2018-10-06 DIAGNOSIS — E1159 Type 2 diabetes mellitus with other circulatory complications: Secondary | ICD-10-CM | POA: Diagnosis not present

## 2018-10-06 DIAGNOSIS — Z1159 Encounter for screening for other viral diseases: Secondary | ICD-10-CM

## 2018-10-06 DIAGNOSIS — H269 Unspecified cataract: Secondary | ICD-10-CM | POA: Diagnosis not present

## 2018-10-06 DIAGNOSIS — F5101 Primary insomnia: Secondary | ICD-10-CM

## 2018-10-06 DIAGNOSIS — R69 Illness, unspecified: Secondary | ICD-10-CM | POA: Diagnosis not present

## 2018-10-06 DIAGNOSIS — I152 Hypertension secondary to endocrine disorders: Secondary | ICD-10-CM

## 2018-10-06 MED ORDER — ATORVASTATIN CALCIUM 40 MG PO TABS: 40.0000 mg | ORAL_TABLET | Freq: Every day | ORAL | 3 refills | Status: DC

## 2018-10-06 MED ORDER — PANTOPRAZOLE SODIUM 40 MG PO TBEC: 40.0000 mg | DELAYED_RELEASE_TABLET | Freq: Every day | ORAL | 3 refills | Status: DC

## 2018-10-06 MED ORDER — MIRTAZAPINE 15 MG PO TABS: 15.0000 mg | ORAL_TABLET | Freq: Every day | ORAL | 2 refills | Status: DC

## 2018-10-06 MED ORDER — METFORMIN HCL 1000 MG PO TABS: 1000.0000 mg | ORAL_TABLET | Freq: Two times a day (BID) | ORAL | 1 refills | Status: DC

## 2018-10-06 MED ORDER — ZOLPIDEM TARTRATE 5 MG PO TABS: 5.0000 mg | ORAL_TABLET | Freq: Every evening | ORAL | 5 refills | Status: DC | PRN

## 2018-10-06 NOTE — Progress Notes (Signed)
Telephone visit  Subjective: CC: f/u sleep PCP: Candace Norlander, DO XAJ:OINOMV H Candace Lopez is a 68 y.o. female calls for telephone consult today. Patient provides verbal consent for consult held via phone.  Location of patient: home Location of provider: Working remotely from home Others present for call: none  1. Type 2 Diabetes w/ HTN, HLD; blurred vision:  Patient reports: FBG 139 this am.  High at home: 145; Low at home: 106, Taking medication(s): Metformin 1000 twice daily, atorvastatin 40 mg daily, lisinopril 20 mg daily and metoprolol XL 25 mg daily.  Last eye exam: UTD in [place] Last foot exam: Up-to-date Last A1c:  Lab Results  Component Value Date   HGBA1C 5.9 04/05/2018   Nephropathy screen indicated?:  On ACE inhibitor Last flu, zoster and/or pneumovax:  Immunization History  Administered Date(s) Administered  . Influenza, High Dose Seasonal PF 12/29/2016, 10/21/2017  . Pneumococcal Conjugate-13 08/26/2017  . Tdap 07/16/2012    ROS: No dizziness, chest pain, shortness of breath.  She does have blurred vision despite use of eyeglasses.  She notes previously she was told that she had cataracts and she would like to have this further evaluated and potentially corrected.  2.  Insomnia Patient reports that despite almost daily use of Ambien she is having several nights lately where she cannot fall asleep for 3 hours after taking the medication and lying down.  She notes that her mind races and she can only focus on the things that she needs to do.  She subsequently is not getting enough rest.  She does report that she still gets more rest with the Ambien then without and has trialed not taking it to see if this would improve her sleep but it does not.  Denies any hallucinations, falls or dizziness.  Allergies  Allergen Reactions  . Tape Rash   Past Medical History:  Diagnosis Date  . DDD (degenerative disc disease), lumbar   . Diabetes mellitus without complication  ([place])   . GERD (gastroesophageal reflux disease)   . Hyperlipidemia   . Hypertension   . Urge incontinence of urine     Current Outpatient Medications:  .  aspirin 81 MG chewable tablet, Chew 81 mg by mouth 2 (two) times daily., Disp: , Rfl:  .  atorvastatin (LIPITOR) 40 MG tablet, TAKE 1 TABLET BY MOUTH EVERY DAY FOR CHOLESTEROL (Patient taking differently: Take 40 mg by mouth at bedtime. ), Disp: 90 tablet, Rfl: 0 .  Blood Glucose Monitoring Suppl (ONE TOUCH ULTRA 2) w/Device KIT, 1 KIT BY DOES NOT APPLY ROUTE ONCE FOR 1 DOSE., Disp: , Rfl: 0 .  cholecalciferol (VITAMIN D) 1000 units tablet, Take 1,000 Units by mouth daily., Disp: , Rfl:  .  diclofenac (VOLTAREN) 75 MG EC tablet, TAKE 1 TABLET BY MOUTH 2 (TWO) TIMES DAILY AS NEEDED FOR MODERATE PAIN. (Patient taking differently: Take 75 mg by mouth 2 (two) times daily as needed for moderate pain. ), Disp: 60 tablet, Rfl: 3 .  GAVILYTE-N WITH FLAVOR PACK 420 g solution, See admin instructions., Disp: , Rfl:  .  glucose blood (ONETOUCH ULTRA) test strip, Check blood sugar twice daily Dx 11.40, Disp: 200 each, Rfl: 3 .  lisinopril (ZESTRIL) 20 MG tablet, TAKE 1 TABLET BY MOUTH EVERYDAY AT BEDTIME, Disp: 90 tablet, Rfl: 1 .  metFORMIN (GLUCOPHAGE) 1000 MG tablet, Take 1 tablet (1,000 mg total) by mouth 2 (two) times daily with a meal., Disp: 180 tablet, Rfl: 1 .  metoprolol succinate (TOPROL-XL)  25 MG 24 hr tablet, Take 25 mg by mouth at bedtime. , Disp: , Rfl: 5 .  ONETOUCH DELICA LANCETS 27P MISC, USE TO CHECK BLOOD SUGAR TWICE DAILY., Disp: 200 each, Rfl: 2 .  pantoprazole (PROTONIX) 40 MG tablet, TAKE 1 TABLET BY MOUTH EVERY DAY (Patient taking differently: Take 40 mg by mouth daily with breakfast. ), Disp: 90 tablet, Rfl: 0 .  TURMERIC PO, Take 1 capsule by mouth 2 (two) times daily. , Disp: , Rfl:  .  zolpidem (AMBIEN) 5 MG tablet, Take 1 tablet (5 mg total) by mouth at bedtime as needed for sleep., Disp: 30 tablet, Rfl: 5  Blood pressure  139/63. Gen: does not sound distressed Pulm: no dyspnea with speech Psych: speech slightly pressured, thought process appropriate.  Assessment/ Plan: 68 y.o. female   1. Type 2 diabetes mellitus with diabetic neuropathy, without long-term current use of insulin ([place]) She will come in for labs.  Continue metformin twice daily.  Needs pneumococcal vaccination and influenza vaccination.  She will get vaccines done while getting labs done - metFORMIN (GLUCOPHAGE) 1000 MG tablet; Take 1 tablet (1,000 mg total) by mouth 2 (two) times daily with a meal.  Dispense: 180 tablet; Refill: 1 - CMP14+EGFR; Future - Bayer DCA Hb A1c Waived; Future  2. Hypertension associated with diabetes ([place]) Controlled.  Continue current regimen  3. Hyperlipidemia associated with type 2 diabetes mellitus ([place]) Not due for fasting lipid panel.  Continue statin - atorvastatin (LIPITOR) 40 MG tablet; Take 1 tablet (40 mg total) by mouth at bedtime.  Dispense: 90 tablet; Refill: 3 - CMP14+EGFR; Future  4. Primary insomnia Not as controlled as it was previously.  We discussed addition of anxiolytic and mirtazapine has been sent in.  Plan for taper off of Ambien if she responds well to mirtazapine. The Narcotic Database has been reviewed.  There were no red flags.   - zolpidem (AMBIEN) 5 MG tablet; Take 1 tablet (5 mg total) by mouth at bedtime as needed for sleep.  Dispense: 30 tablet; Refill: 5  5. Cataract, unspecified cataract type, unspecified laterality Will place referral to ophthalmology for further evaluation - Ambulatory referral to Ophthalmology  6. Encounter for hepatitis C screening test for low risk patient - Hepatitis C antibody; Future  7. Gastroesophageal reflux disease without esophagitis Controlled.  Protonix renewed - pantoprazole (PROTONIX) 40 MG tablet; Take 1 tablet (40 mg total) by mouth daily with breakfast.  Dispense: 90 tablet; Refill: 3  Meds ordered this encounter  Medications  .  zolpidem (AMBIEN) 5 MG tablet    Sig: Take 1 tablet (5 mg total) by mouth at bedtime as needed for sleep.    Dispense:  30 tablet    Refill:  5  . atorvastatin (LIPITOR) 40 MG tablet    Sig: Take 1 tablet (40 mg total) by mouth at bedtime.    Dispense:  90 tablet    Refill:  3  . metFORMIN (GLUCOPHAGE) 1000 MG tablet    Sig: Take 1 tablet (1,000 mg total) by mouth 2 (two) times daily with a meal.    Dispense:  180 tablet    Refill:  1  . pantoprazole (PROTONIX) 40 MG tablet    Sig: Take 1 tablet (40 mg total) by mouth daily with breakfast.    Dispense:  90 tablet    Refill:  3  . mirtazapine (REMERON) 15 MG tablet    Sig: Take 1 tablet (15 mg total) by mouth at bedtime.  Dispense:  30 tablet    Refill:  2   Start time: 7:56am End time: 8:14am  Total time spent on patient care (including telephone call/ virtual visit): 22 minutes  Candace Lopez, [place] [phone]

## 2018-10-07 ENCOUNTER — Other Ambulatory Visit: Payer: Self-pay

## 2018-10-07 ENCOUNTER — Other Ambulatory Visit (INDEPENDENT_AMBULATORY_CARE_PROVIDER_SITE_OTHER): Payer: Medicare HMO

## 2018-10-07 DIAGNOSIS — E785 Hyperlipidemia, unspecified: Secondary | ICD-10-CM | POA: Diagnosis not present

## 2018-10-07 DIAGNOSIS — E1140 Type 2 diabetes mellitus with diabetic neuropathy, unspecified: Secondary | ICD-10-CM

## 2018-10-07 DIAGNOSIS — Z1159 Encounter for screening for other viral diseases: Secondary | ICD-10-CM

## 2018-10-07 DIAGNOSIS — E1169 Type 2 diabetes mellitus with other specified complication: Secondary | ICD-10-CM

## 2018-10-07 DIAGNOSIS — Z23 Encounter for immunization: Secondary | ICD-10-CM | POA: Diagnosis not present

## 2018-10-07 LAB — BAYER DCA HB A1C WAIVED: HB A1C (BAYER DCA - WAIVED): 6.2 % (ref <7.0)

## 2018-10-08 LAB — CMP14+EGFR
ALT: 13 IU/L (ref 0–32)
AST: 13 IU/L (ref 0–40)
Albumin/Globulin Ratio: 1.9 (ref 1.2–2.2)
Albumin: 4.4 g/dL (ref 3.8–4.8)
Alkaline Phosphatase: 53 IU/L (ref 39–117)
BUN/Creatinine Ratio: 23 (ref 12–28)
BUN: 22 mg/dL (ref 8–27)
Bilirubin Total: 1.6 mg/dL — ABNORMAL HIGH (ref 0.0–1.2)
CO2: 23 mmol/L (ref 20–29)
Calcium: 9.7 mg/dL (ref 8.7–10.3)
Chloride: 105 mmol/L (ref 96–106)
Creatinine, Ser: 0.95 mg/dL (ref 0.57–1.00)
GFR calc Af Amer: 72 mL/min/{1.73_m2} (ref >59)
GFR calc non Af Amer: 62 mL/min/{1.73_m2} (ref >59)
Globulin, Total: 2.3 g/dL (ref 1.5–4.5)
Glucose: 129 mg/dL — ABNORMAL HIGH (ref 65–99)
Potassium: 4.7 mmol/L (ref 3.5–5.2)
Sodium: 142 mmol/L (ref 134–144)
Total Protein: 6.7 g/dL (ref 6.0–8.5)

## 2018-10-08 LAB — HEPATITIS C ANTIBODY: Hep C Virus Ab: <0.1 s/co ratio (ref 0.0–0.9)

## 2018-10-21 DIAGNOSIS — R69 Illness, unspecified: Secondary | ICD-10-CM | POA: Diagnosis not present

## 2018-10-28 DIAGNOSIS — H2513 Age-related nuclear cataract, bilateral: Secondary | ICD-10-CM | POA: Diagnosis not present

## 2018-10-28 DIAGNOSIS — E119 Type 2 diabetes mellitus without complications: Secondary | ICD-10-CM | POA: Diagnosis not present

## 2018-10-28 DIAGNOSIS — H04123 Dry eye syndrome of bilateral lacrimal glands: Secondary | ICD-10-CM | POA: Diagnosis not present

## 2018-10-28 LAB — HM DIABETES EYE EXAM

## 2018-11-02 ENCOUNTER — Other Ambulatory Visit: Payer: Self-pay | Admitting: Family Medicine

## 2018-11-08 ENCOUNTER — Other Ambulatory Visit: Payer: Self-pay

## 2018-11-08 DIAGNOSIS — Z20828 Contact with and (suspected) exposure to other viral communicable diseases: Secondary | ICD-10-CM | POA: Diagnosis not present

## 2018-11-08 DIAGNOSIS — Z20822 Contact with and (suspected) exposure to covid-19: Secondary | ICD-10-CM

## 2018-11-09 LAB — NOVEL CORONAVIRUS, NAA: SARS-CoV-2, NAA: NOT DETECTED

## 2018-11-10 ENCOUNTER — Telehealth: Payer: Self-pay | Admitting: Family Medicine

## 2018-11-10 NOTE — Telephone Encounter (Signed)
Negative COVID results given. Patient results "NOT Detected." Caller expressed understanding. ° °

## 2019-01-25 DIAGNOSIS — Z20828 Contact with and (suspected) exposure to other viral communicable diseases: Secondary | ICD-10-CM | POA: Diagnosis not present

## 2019-01-26 ENCOUNTER — Other Ambulatory Visit: Payer: Self-pay | Admitting: Family Medicine

## 2019-01-26 ENCOUNTER — Telehealth: Payer: Self-pay | Admitting: Family Medicine

## 2019-01-26 DIAGNOSIS — H66002 Acute suppurative otitis media without spontaneous rupture of ear drum, left ear: Secondary | ICD-10-CM

## 2019-01-26 MED ORDER — AMOXICILLIN-POT CLAVULANATE 875-125 MG PO TABS: 1.0000 | ORAL_TABLET | Freq: Two times a day (BID) | ORAL | 0 refills | Status: DC

## 2019-01-26 NOTE — Telephone Encounter (Signed)
Patient aware and verbalizes understanding. States that she is treating the symptoms but her right ear pain has been unbearable.  States that it has been going on for 2 days and states that she thinks she has an ear infection.  Would like to know if abx can be sent in? Please advise and send to pools.

## 2019-01-26 NOTE — Telephone Encounter (Signed)
What symptoms do you have? Body aches, sore throat, runny nose, slight headache and right ear hurts. She can't lay on it. Went to get tested for COVID 12-29 and don't know the results. Don't want to go to Urgent Care and spread what she has.  How long have you been sick? 2 days  Have you been seen for this problem? NO  If your provider decides to give you a prescription, which pharmacy would you like for it to be sent to? [facility] in [place]   Patient informed that this information will be sent to the clinical staff for review and that they should receive a follow up call.

## 2019-01-26 NOTE — Telephone Encounter (Signed)
Recommend use of Tylenol, fluids.  If gets worse have her seek medical attention to rule out secondary bacterial infection.  Though I agree this sounds viral.  - Get plenty of rest and drink plenty of fluids. - Try to breathe moist air. Use a cold mist humidifier. - Consume warm fluids (soup or tea) to provide relief for a stuffy nose and to loosen phlegm. - For nasal stuffiness, try saline nasal spray or a Neti Pot. Afrin nasal spray can also be used but this product should not be used longer than 3 days or it will cause rebound nasal stuffiness (worsening nasal congestion). - For sore throat pain relief: use chloraseptic spray, suck on throat lozenges, hard candy or popsicles; gargle with warm salt water (1/4 tsp. salt per 8 oz. of water); and eat soft, bland foods. - Eat a well-balanced diet. If you cannot, ensure you are getting enough nutrients by taking a daily multivitamin. - Avoid dairy products, as they can thicken phlegm. - Avoid alcohol, as it impairs your body's immune system.  CONTACT YOUR DOCTOR IF YOU EXPERIENCE ANY OF THE FOLLOWING: - High fever - Ear pain - Sinus-type headache - Unusually severe cold symptoms - Cough that gets worse while other cold symptoms improve - Flare up of any chronic lung problem, such as asthma - Your symptoms persist longer than 2 weeks

## 2019-01-26 NOTE — Telephone Encounter (Signed)
Augmentin sent to pharmacy.  However, if symptoms do not improve or worsen she needs to have evaluation at urgent care.

## 2019-01-26 NOTE — Telephone Encounter (Signed)
Patient aware and verbalized understanding. °

## 2019-02-18 ENCOUNTER — Other Ambulatory Visit: Payer: Self-pay | Admitting: Family Medicine

## 2019-03-20 ENCOUNTER — Other Ambulatory Visit: Payer: Self-pay | Admitting: Family Medicine

## 2019-03-20 DIAGNOSIS — I152 Hypertension secondary to endocrine disorders: Secondary | ICD-10-CM

## 2019-03-20 DIAGNOSIS — E1159 Type 2 diabetes mellitus with other circulatory complications: Secondary | ICD-10-CM

## 2019-03-23 ENCOUNTER — Ambulatory Visit (INDEPENDENT_AMBULATORY_CARE_PROVIDER_SITE_OTHER): Payer: Medicare HMO | Admitting: Family Medicine

## 2019-03-23 DIAGNOSIS — M79605 Pain in left leg: Secondary | ICD-10-CM | POA: Diagnosis not present

## 2019-03-23 MED ORDER — MELOXICAM 15 MG PO TABS: 7.5000 mg | ORAL_TABLET | Freq: Every day | ORAL | 0 refills | Status: DC | PRN

## 2019-03-23 NOTE — Progress Notes (Signed)
Telephone visit  Subjective: CC: buttock pain/ knee pain PCP: Janora Norlander, DO WPY:KDXIPJ H Lute is a 69 y.o. female calls for telephone consult today. Patient provides verbal consent for consult held via phone.  Due to COVID-19 pandemic this visit was conducted virtually. This visit type was conducted due to national recommendations for restrictions regarding the COVID-19 Pandemic (e.g. social distancing, sheltering in place) in an effort to limit this patient's exposure and mitigate transmission in our community. All issues noted in this document were discussed and addressed.  A physical exam was not performed with this format.   Location of patient: home Location of provider: [facility] Others present for call: none  1. Leg  Patient reports onset of left leg that radiates from her ischium and radiates down to knee. No radiation of pain to groin.  Pain is worse with walking, such that she can no longer walk as far as she used to.  Pain is present with sitting as well.  She denies weakness. She has chronic numbness on the top of her leg after hysterectomy.  She reports a "crawling" sensation in the posterior leg that is relieved by rubbing.  She has taken Motrin but it has not helped.  She is using foot therapy lotion.  She has been doing yoga but this is not helping.  ROS: Per HPI  Allergies  Allergen Reactions  . Tape Rash   Past Medical History:  Diagnosis Date  . DDD (degenerative disc disease), lumbar   . Diabetes mellitus without complication ([place])   . GERD (gastroesophageal reflux disease)   . Hyperlipidemia   . Hypertension   . Urge incontinence of urine     Current Outpatient Medications:  .  aspirin 81 MG chewable tablet, Chew 81 mg by mouth 2 (two) times daily., Disp: , Rfl:  .  atorvastatin (LIPITOR) 40 MG tablet, Take 1 tablet (40 mg total) by mouth at bedtime., Disp: 90 tablet, Rfl: 3 .  Blood Glucose Monitoring Suppl (ONE TOUCH ULTRA 2) w/Device KIT, 1 KIT BY DOES  NOT APPLY ROUTE ONCE FOR 1 DOSE., Disp: , Rfl: 0 .  cholecalciferol (VITAMIN D) 1000 units tablet, Take 1,000 Units by mouth daily., Disp: , Rfl:  .  GAVILYTE-N WITH FLAVOR PACK 420 g solution, See admin instructions., Disp: , Rfl:  .  lisinopril (ZESTRIL) 20 MG tablet, TAKE 1 TABLET BY MOUTH EVERYDAY AT BEDTIME (Needs to be seen before next refill), Disp: 30 tablet, Rfl: 0 .  metFORMIN (GLUCOPHAGE) 1000 MG tablet, Take 1 tablet (1,000 mg total) by mouth 2 (two) times daily with a meal., Disp: 180 tablet, Rfl: 1 .  metoprolol succinate (TOPROL-XL) 25 MG 24 hr tablet, Take 25 mg by mouth at bedtime. , Disp: , Rfl: 5 .  ONETOUCH DELICA LANCETS 82N MISC, USE TO CHECK BLOOD SUGAR TWICE DAILY., Disp: 200 each, Rfl: 2 .  ONETOUCH ULTRA test strip, CHECK BLOOD SUGAR TWICE DAILY DX 11.40, Disp: 200 strip, Rfl: 3 .  pantoprazole (PROTONIX) 40 MG tablet, Take 1 tablet (40 mg total) by mouth daily with breakfast., Disp: 90 tablet, Rfl: 3 .  TURMERIC PO, Take 1 capsule by mouth 2 (two) times daily. , Disp: , Rfl:  .  zolpidem (AMBIEN) 5 MG tablet, Take 1 tablet (5 mg total) by mouth at bedtime as needed for sleep., Disp: 30 tablet, Rfl: 5  Assessment/ Plan: 69 y.o. female   1. Left leg pain I question ischial bursitis given her reports of radiation of  pain from the lower pelvic bone down the leg.  We discussed other differentials including intra-articular issue.  I will empirically treat her with an oral NSAID.  I discussed not to use any other oral NSAIDs while taking.  I am recommending that she follow-up with me in office in 2 weeks and if no significant improvement with this therapy will plan for plain films.  May also need to consider evaluation by sports medicine or orthopedics. - meloxicam (MOBIC) 15 MG tablet; Take 0.5-1 tablets (7.5-15 mg total) by mouth daily as needed for pain. Take with food  Dispense: 30 tablet; Refill: 0   Meds ordered this encounter  Medications  . DISCONTD: meloxicam  (MOBIC) 15 MG tablet    Sig: Take 0.5-1 tablets (7.5-15 mg total) by mouth daily as needed for pain. Take with food    Dispense:  30 tablet    Refill:  0  . meloxicam (MOBIC) 15 MG tablet    Sig: Take 0.5-1 tablets (7.5-15 mg total) by mouth daily as needed for pain. Take with food    Dispense:  30 tablet    Refill:  0    Start time: 8:48am End time: 9:07am  Total time spent on patient care (including telephone call/ virtual visit): 22 minutes  [place], [place] [phone]

## 2019-04-04 ENCOUNTER — Encounter: Payer: Self-pay | Admitting: Family Medicine

## 2019-04-04 ENCOUNTER — Ambulatory Visit: Payer: Medicare HMO | Admitting: Neurology

## 2019-04-04 ENCOUNTER — Ambulatory Visit (INDEPENDENT_AMBULATORY_CARE_PROVIDER_SITE_OTHER): Payer: Medicare HMO | Admitting: Family Medicine

## 2019-04-04 ENCOUNTER — Other Ambulatory Visit: Payer: Self-pay

## 2019-04-04 VITALS — BP 113/65 | HR 65 | Temp 97.6°F | Ht 65.0 in | Wt 157.0 lb

## 2019-04-04 DIAGNOSIS — M25552 Pain in left hip: Secondary | ICD-10-CM

## 2019-04-04 DIAGNOSIS — H9201 Otalgia, right ear: Secondary | ICD-10-CM

## 2019-04-04 NOTE — Patient Instructions (Addendum)
Dr Teofilo Pod office called (neurology) for your CPAP.  Your sugar is controlled.  Not sure why someone wants to come to your home.  [facility] will call you for your hip/leg pain  [facility] will call you for your ear pain.  Hopefully, we can get these appts done the same day

## 2019-04-04 NOTE — Progress Notes (Signed)
Subjective: CC: Hip/buttock pain/ear pain PCP: Candace Norlander, DO [name] is a 69 y.o. female presenting to clinic today for:  1.  Hip/buttock pain Patient reports ongoing left-sided hip/buttock pain for over a year.  She points to the ischium as the area of pain.  Pain is exacerbated by riding horses and by moderate flexion of the hip.  She has tried yoga and various other exercises to improve the pain but this is not work.  Denies any radicular symptoms, weakness.  Pain does tend to radiate to the posterior thigh.  She tried using meloxicam and ibuprofen in the past with no improvement in symptoms.  She has done stretching but again no improvement in symptoms.  2.  Ear pain Patient reports 33-monthhistory of right-sided ear pain.  Pain is intermittent.  She is been treated with antibiotics but again symptoms have not resolved.  She notes that sometimes it is a deep, sharp stabbing pain.  Denies any drainage, fevers, balance issues or change in hearing.   ROS: Per HPI  Allergies  Allergen Reactions  . Tape Rash   Past Medical History:  Diagnosis Date  . DDD (degenerative disc disease), lumbar   . Diabetes mellitus without complication ([place]   . GERD (gastroesophageal reflux disease)   . Hyperlipidemia   . Hypertension   . Urge incontinence of urine     Current Outpatient Medications:  .  aspirin 81 MG chewable tablet, Chew 81 mg by mouth 2 (two) times daily., Disp: , Rfl:  .  atorvastatin (LIPITOR) 40 MG tablet, Take 1 tablet (40 mg total) by mouth at bedtime., Disp: 90 tablet, Rfl: 3 .  Blood Glucose Monitoring Suppl (ONE TOUCH ULTRA 2) w/Device KIT, 1 KIT BY DOES NOT APPLY ROUTE ONCE FOR 1 DOSE., Disp: , Rfl: 0 .  cholecalciferol (VITAMIN D) 1000 units tablet, Take 1,000 Units by mouth daily., Disp: , Rfl:  .  lisinopril (ZESTRIL) 20 MG tablet, TAKE 1 TABLET BY MOUTH EVERYDAY AT BEDTIME (Needs to be seen before next refill), Disp: 30 tablet, Rfl: 0 .   metFORMIN (GLUCOPHAGE) 1000 MG tablet, Take 1 tablet (1,000 mg total) by mouth 2 (two) times daily with a meal., Disp: 180 tablet, Rfl: 1 .  metoprolol succinate (TOPROL-XL) 25 MG 24 hr tablet, Take 25 mg by mouth at bedtime. , Disp: , Rfl: 5 .  ONETOUCH DELICA LANCETS 344YMISC, USE TO CHECK BLOOD SUGAR TWICE DAILY., Disp: 200 each, Rfl: 2 .  ONETOUCH ULTRA test strip, CHECK BLOOD SUGAR TWICE DAILY DX 11.40, Disp: 200 strip, Rfl: 3 .  TURMERIC PO, Take 1 capsule by mouth 2 (two) times daily. , Disp: , Rfl:  .  zolpidem (AMBIEN) 5 MG tablet, Take 1 tablet (5 mg total) by mouth at bedtime as needed for sleep., Disp: 30 tablet, Rfl: 5 Social History   Socioeconomic History  . Marital status: Married    Spouse name: Not on file  . Number of children: Not on file  . Years of education: Not on file  . Highest education level: Not on file  Occupational History  . Not on file  Tobacco Use  . Smoking status: Never Smoker  . Smokeless tobacco: Never Used  Substance and Sexual Activity  . Alcohol use: No  . Drug use: No  . Sexual activity: Not on file  Other Topics Concern  . Not on file  Social History Narrative  . Not on file   Social Determinants of  Health   Financial Resource Strain:   . Difficulty of Paying Living Expenses: Not on file  Food Insecurity:   . Worried About Charity fundraiser in the Last Year: Not on file  . Ran Out of Food in the Last Year: Not on file  Transportation Needs:   . Lack of Transportation (Medical): Not on file  . Lack of Transportation (Non-Medical): Not on file  Physical Activity:   . Days of Exercise per Week: Not on file  . Minutes of Exercise per Session: Not on file  Stress:   . Feeling of Stress : Not on file  Social Connections:   . Frequency of Communication with Friends and Family: Not on file  . Frequency of Social Gatherings with Friends and Family: Not on file  . Attends Religious Services: Not on file  . Active Member of Clubs or  Organizations: Not on file  . Attends Archivist Meetings: Not on file  . Marital Status: Not on file  Intimate Partner Violence:   . Fear of Current or Ex-Partner: Not on file  . Emotionally Abused: Not on file  . Physically Abused: Not on file  . Sexually Abused: Not on file   Family History  Problem Relation Age of Onset  . Cancer Mother        lung cancer  . Cancer Father        lymphnodes  . Diabetes Sister   . Diabetes Brother   . Diabetes Son   . Cancer Maternal Aunt        throat  . Cancer Paternal Uncle        unknown   . Cancer Maternal Grandfather   . Heart disease Paternal Grandfather   . Cancer Other        breast  . Colon cancer Neg Hx     Objective: Office vital signs reviewed. BP 113/65   Pulse 65   Temp 97.6 F (36.4 C) (Temporal)   Ht 5' 5"  (1.651 m)   Wt 157 lb (71.2 kg)   SpO2 97%   BMI 26.13 kg/m   Physical Examination:  General: Awake, alert, well nourished, No acute distress HEENT: Normal    Neck: No masses palpated. No lymphadenopathy    Ears: Tympanic membranes intact, normal light reflex, no erythema, no bulging Extremities: warm, well perfused, No edema, cyanosis or clubbing; +2 pulses bilaterally MSK: normal gait and station  Sacrum: No tenderness to palpation to the ischium.  There are no palpable bony abnormalities.  Pain is reproducible with moderate flexion of the left hip.  Assessment/ Plan: 69 y.o. female   1. Ischial pain, left I initially considered ischial bursitis.  However, symptoms were not reproducible on exam.?  Hamstring tendinitis at the insertion given the radiation of pain to the posterior thigh.  Will place a referral to orthopedics for further evaluation.  Perhaps they can evaluate this area further under ultrasound.  Thus far symptoms are refractory to conservative measures. - Ambulatory referral to Orthopedic Surgery  2. Right ear pain I question eustachian tube dysfunction.  Structurally there does  not appear to be anything abnormal on exam.  Certainly no evidence of infection. - Ambulatory referral to ENT   No orders of the defined types were placed in this encounter.  No orders of the defined types were placed in this encounter.    Candace Norlander, DO [place] [phone]

## 2019-04-08 ENCOUNTER — Other Ambulatory Visit: Payer: Self-pay | Admitting: Family Medicine

## 2019-04-08 DIAGNOSIS — F5101 Primary insomnia: Secondary | ICD-10-CM

## 2019-04-08 NOTE — Telephone Encounter (Signed)
Tammy from [facility] called to confirm if we received her electronic request for pt's Zolpidem. Says pt needs this Rx before the weekend.

## 2019-04-08 NOTE — Telephone Encounter (Signed)
To pharmacy: Spoke with patient and she said she just had an appt on Monday 3/8 and Dr. Nadine Counts told her she would update her meds..Thanks

## 2019-04-12 ENCOUNTER — Other Ambulatory Visit: Payer: Self-pay | Admitting: Family Medicine

## 2019-04-12 DIAGNOSIS — I152 Hypertension secondary to endocrine disorders: Secondary | ICD-10-CM

## 2019-04-12 DIAGNOSIS — E1159 Type 2 diabetes mellitus with other circulatory complications: Secondary | ICD-10-CM

## 2019-04-15 ENCOUNTER — Other Ambulatory Visit: Payer: Self-pay | Admitting: Family Medicine

## 2019-04-15 DIAGNOSIS — M79605 Pain in left leg: Secondary | ICD-10-CM

## 2019-04-15 NOTE — Telephone Encounter (Signed)
Medication not on patients list

## 2019-04-18 DIAGNOSIS — M25552 Pain in left hip: Secondary | ICD-10-CM | POA: Diagnosis not present

## 2019-04-19 DIAGNOSIS — S76312D Strain of muscle, fascia and tendon of the posterior muscle group at thigh level, left thigh, subsequent encounter: Secondary | ICD-10-CM | POA: Diagnosis not present

## 2019-04-19 DIAGNOSIS — M7072 Other bursitis of hip, left hip: Secondary | ICD-10-CM | POA: Diagnosis not present

## 2019-05-07 DIAGNOSIS — E1140 Type 2 diabetes mellitus with diabetic neuropathy, unspecified: Secondary | ICD-10-CM | POA: Diagnosis not present

## 2019-05-07 DIAGNOSIS — E785 Hyperlipidemia, unspecified: Secondary | ICD-10-CM | POA: Diagnosis not present

## 2019-05-07 DIAGNOSIS — I10 Essential (primary) hypertension: Secondary | ICD-10-CM | POA: Diagnosis not present

## 2019-05-07 DIAGNOSIS — M1990 Unspecified osteoarthritis, unspecified site: Secondary | ICD-10-CM | POA: Diagnosis not present

## 2019-05-07 DIAGNOSIS — K219 Gastro-esophageal reflux disease without esophagitis: Secondary | ICD-10-CM | POA: Diagnosis not present

## 2019-05-07 DIAGNOSIS — I499 Cardiac arrhythmia, unspecified: Secondary | ICD-10-CM | POA: Diagnosis not present

## 2019-05-07 DIAGNOSIS — Z7982 Long term (current) use of aspirin: Secondary | ICD-10-CM | POA: Diagnosis not present

## 2019-05-07 DIAGNOSIS — E1165 Type 2 diabetes mellitus with hyperglycemia: Secondary | ICD-10-CM | POA: Diagnosis not present

## 2019-05-07 DIAGNOSIS — Z7722 Contact with and (suspected) exposure to environmental tobacco smoke (acute) (chronic): Secondary | ICD-10-CM | POA: Diagnosis not present

## 2019-05-07 DIAGNOSIS — G4700 Insomnia, unspecified: Secondary | ICD-10-CM | POA: Diagnosis not present

## 2019-05-08 ENCOUNTER — Other Ambulatory Visit: Payer: Self-pay | Admitting: Family Medicine

## 2019-05-08 DIAGNOSIS — E1159 Type 2 diabetes mellitus with other circulatory complications: Secondary | ICD-10-CM

## 2019-05-08 DIAGNOSIS — I152 Hypertension secondary to endocrine disorders: Secondary | ICD-10-CM

## 2019-05-31 ENCOUNTER — Other Ambulatory Visit: Payer: Self-pay | Admitting: Family Medicine

## 2019-05-31 DIAGNOSIS — I152 Hypertension secondary to endocrine disorders: Secondary | ICD-10-CM

## 2019-05-31 DIAGNOSIS — E1159 Type 2 diabetes mellitus with other circulatory complications: Secondary | ICD-10-CM

## 2019-06-04 ENCOUNTER — Other Ambulatory Visit: Payer: Self-pay | Admitting: Family Medicine

## 2019-06-04 DIAGNOSIS — E1140 Type 2 diabetes mellitus with diabetic neuropathy, unspecified: Secondary | ICD-10-CM

## 2019-07-03 ENCOUNTER — Other Ambulatory Visit: Payer: Self-pay | Admitting: Family Medicine

## 2019-07-03 DIAGNOSIS — F5101 Primary insomnia: Secondary | ICD-10-CM

## 2019-07-04 ENCOUNTER — Other Ambulatory Visit: Payer: Self-pay | Admitting: Family Medicine

## 2019-07-04 DIAGNOSIS — E1140 Type 2 diabetes mellitus with diabetic neuropathy, unspecified: Secondary | ICD-10-CM

## 2019-07-05 ENCOUNTER — Other Ambulatory Visit: Payer: Self-pay | Admitting: Family Medicine

## 2019-07-05 DIAGNOSIS — I152 Hypertension secondary to endocrine disorders: Secondary | ICD-10-CM

## 2019-07-05 DIAGNOSIS — E1159 Type 2 diabetes mellitus with other circulatory complications: Secondary | ICD-10-CM

## 2019-07-08 ENCOUNTER — Other Ambulatory Visit: Payer: Self-pay | Admitting: Family Medicine

## 2019-07-08 DIAGNOSIS — F5101 Primary insomnia: Secondary | ICD-10-CM

## 2019-07-25 ENCOUNTER — Telehealth: Payer: Self-pay | Admitting: *Deleted

## 2019-07-25 ENCOUNTER — Encounter: Payer: Self-pay | Admitting: Family Medicine

## 2019-07-25 ENCOUNTER — Other Ambulatory Visit: Payer: Self-pay

## 2019-07-25 ENCOUNTER — Ambulatory Visit (INDEPENDENT_AMBULATORY_CARE_PROVIDER_SITE_OTHER): Payer: Medicare HMO | Admitting: Family Medicine

## 2019-07-25 VITALS — BP 108/58 | HR 77 | Temp 96.8°F | Ht 65.0 in | Wt 158.6 lb

## 2019-07-25 DIAGNOSIS — F5101 Primary insomnia: Secondary | ICD-10-CM | POA: Diagnosis not present

## 2019-07-25 DIAGNOSIS — I10 Essential (primary) hypertension: Secondary | ICD-10-CM

## 2019-07-25 DIAGNOSIS — E1159 Type 2 diabetes mellitus with other circulatory complications: Secondary | ICD-10-CM

## 2019-07-25 DIAGNOSIS — Z79899 Other long term (current) drug therapy: Secondary | ICD-10-CM | POA: Diagnosis not present

## 2019-07-25 DIAGNOSIS — L989 Disorder of the skin and subcutaneous tissue, unspecified: Secondary | ICD-10-CM | POA: Diagnosis not present

## 2019-07-25 DIAGNOSIS — L729 Follicular cyst of the skin and subcutaneous tissue, unspecified: Secondary | ICD-10-CM | POA: Diagnosis not present

## 2019-07-25 DIAGNOSIS — I152 Hypertension secondary to endocrine disorders: Secondary | ICD-10-CM

## 2019-07-25 DIAGNOSIS — R69 Illness, unspecified: Secondary | ICD-10-CM | POA: Diagnosis not present

## 2019-07-25 MED ORDER — ZOLPIDEM TARTRATE 5 MG PO TABS: ORAL_TABLET | ORAL | 5 refills | Status: DC

## 2019-07-25 MED ORDER — LISINOPRIL 20 MG PO TABS: 20.0000 mg | ORAL_TABLET | Freq: Every day | ORAL | 3 refills | Status: DC

## 2019-07-25 NOTE — Progress Notes (Signed)
Subjective: CC: skin concern PCP: Janora Norlander, DO SHF:WYOVZC Candace Lopez is a 69 y.o. female presenting to clinic today for:  1. Skin concern Patient presents with multiple skin concerns.  She notes a lesion on her scalp and was worried that she might have a brain cancer of some sort.  It is irritated when she would use her CPAP machine.  She also notes several lesions along the left side of her face that occasionally spontaneously bleed.  She has a history of skin cancer along the lateral left nose which required Mohs procedure in [place] previously.  She would like to get back to her old dermatologist, a different one that was in [place] to have these cheek lesions evaluated.  2.  Insomnia Patient reports she needs refills on the Ambien 5 mg daily.  She is not slept well lately because she has been out of the medicine. Denies any excessive daytime sedation, falls, hallucinations or mental status changes.  ROS: Per HPI  Allergies  Allergen Reactions  . Tape Rash   Past Medical History:  Diagnosis Date  . DDD (degenerative disc disease), lumbar   . Diabetes mellitus without complication ([place])   . GERD (gastroesophageal reflux disease)   . Hyperlipidemia   . Hypertension   . Urge incontinence of urine     Current Outpatient Medications:  .  aspirin 81 MG chewable tablet, Chew 81 mg by mouth 2 (two) times daily., Disp: , Rfl:  .  atorvastatin (LIPITOR) 40 MG tablet, Take 1 tablet (40 mg total) by mouth at bedtime., Disp: 90 tablet, Rfl: 3 .  Blood Glucose Monitoring Suppl (ONE TOUCH ULTRA 2) w/Device KIT, 1 KIT BY DOES NOT APPLY ROUTE ONCE FOR 1 DOSE., Disp: , Rfl: 0 .  cholecalciferol (VITAMIN D) 1000 units tablet, Take 1,000 Units by mouth daily., Disp: , Rfl:  .  lisinopril (ZESTRIL) 20 MG tablet, TAKE 1 TABLET BY MOUTH EVERYDAY AT BEDTIME, Disp: 30 tablet, Rfl: 0 .  meloxicam (MOBIC) 15 MG tablet, TAKE 0.5-1 TABLETS (7.5-15 MG TOTAL) BY MOUTH DAILY AS NEEDED  FOR PAIN. TAKE WITH FOOD, Disp: 30 tablet, Rfl: 0 .  metFORMIN (GLUCOPHAGE) 1000 MG tablet, Take 1 tablet (1,000 mg total) by mouth 2 (two) times daily with a meal., Disp: 60 tablet, Rfl: 0 .  metoprolol succinate (TOPROL-XL) 25 MG 24 hr tablet, Take 25 mg by mouth at bedtime. , Disp: , Rfl: 5 .  ONETOUCH DELICA LANCETS 58I MISC, USE TO CHECK BLOOD SUGAR TWICE DAILY., Disp: 200 each, Rfl: 2 .  ONETOUCH ULTRA test strip, CHECK BLOOD SUGAR TWICE DAILY DX 11.40, Disp: 200 strip, Rfl: 3 .  TURMERIC PO, Take 1 capsule by mouth 2 (two) times daily. , Disp: , Rfl:  .  zolpidem (AMBIEN) 5 MG tablet, take 1 tablet(5 mg total) by mouth at bedtime as needed for sleep, Disp: 30 tablet, Rfl: 2 Social History   Socioeconomic History  . Marital status: Married    Spouse name: Not on file  . Number of children: Not on file  . Years of education: Not on file  . Highest education level: Not on file  Occupational History  . Not on file  Tobacco Use  . Smoking status: Never Smoker  . Smokeless tobacco: Never Used  Vaping Use  . Vaping Use: Never used  Substance and Sexual Activity  . Alcohol use: No  . Drug use: No  . Sexual activity: Not on file  Other Topics Concern  .  Not on file  Social History Narrative  . Not on file   Social Determinants of Health   Financial Resource Strain:   . Difficulty of Paying Living Expenses:   Food Insecurity:   . Worried About Charity fundraiser in the Last Year:   . Arboriculturist in the Last Year:   Transportation Needs:   . Film/video editor (Medical):   Marland Kitchen Lack of Transportation (Non-Medical):   Physical Activity:   . Days of Exercise per Week:   . Minutes of Exercise per Session:   Stress:   . Feeling of Stress :   Social Connections:   . Frequency of Communication with Friends and Family:   . Frequency of Social Gatherings with Friends and Family:   . Attends Religious Services:   . Active Member of Clubs or Organizations:   . Attends English as a second language teacher Meetings:   Marland Kitchen Marital Status:   Intimate Partner Violence:   . Fear of Current or Ex-Partner:   . Emotionally Abused:   Marland Kitchen Physically Abused:   . Sexually Abused:    Family History  Problem Relation Age of Onset  . Cancer Mother        lung cancer  . Cancer Father        lymphnodes  . Diabetes Sister   . Diabetes Brother   . Diabetes Son   . Cancer Maternal Aunt        throat  . Cancer Paternal Uncle        unknown   . Cancer Maternal Grandfather   . Heart disease Paternal Grandfather   . Cancer Other        breast  . Colon cancer Neg Hx     Objective: Office vital signs reviewed. BP (!) 108/58   Pulse 77   Temp (!) 96.8 F (36 C)   Ht _0  (1.651 m)   Wt 158 lb 9.6 oz (71.9 kg)   SpO2 96%   BMI 26.39 kg/m   Physical Examination:  General: Awake, alert, well nourished, No acute distress HEENT: 2 palpable cysts that are well-circumscribed and mobile along the scalp noted Skin: She has a flesh-colored area that is raised and rough feeling along the left anterior cheek and left lateral cheek.  There is no active bleeding or appreciable involution to suggest basal cell.  She does have slight increased vascularity along the cheek.  This appears to be more consistent with actinic keratosis  Assessment/ Plan: 69 y.o. female   1. Scalp cyst We discussed benign nature of this.  If she would like to have this removed due to irritation I think this is reasonable. - Ambulatory referral to Dermatology  2. Skin lesion of cheek Seemingly consistent with an actinic keratosis but what gives me pause is the fact that it spontaneously bleeds.  Given her history of skin cancer which required Mohs procedure along the nose I think it is pertinent to have her evaluated by her dermatologist again to rule out any malignant processes.  Referral to dermatology placed in [facility] per her request. - Ambulatory referral to Dermatology  3. Primary insomnia Not well  controlled but has been out of medicine.  Tox assure ordered.  Ambien 5 mg nightly.  Unfortunately, she did not leave a sufficient urine sample and therefore she will need to return for urine tox screen as per office policy before additional refills will be given.  She was attempted to be reached several  times a day but unfortunately did not answer.  Nurse will try again tomorrow - ToxASSURE Select 13 (MW), Urine - zolpidem (AMBIEN) 5 MG tablet; take 1 tablet(5 mg total) by mouth at bedtime as needed for sleep  Dispense: 30 tablet; Refill: 5  4. Controlled substance agreement signed - ToxASSURE Select 13 (MW), Urine  5. Hypertension associated with diabetes ([place]) Controlled.  Continue current regimen.  Refill sent - lisinopril (ZESTRIL) 20 MG tablet; Take 1 tablet (20 mg total) by mouth daily.  Dispense: 90 tablet; Refill: 3   No orders of the defined types were placed in this encounter.  No orders of the defined types were placed in this encounter.    Janora Norlander, DO [place] [phone]

## 2019-07-25 NOTE — Telephone Encounter (Signed)
[facility] - [id number] Zolpidem Tartrate 5MG  tablets     Status: PA Request  Created: June 28th, 2021  Sent to plan: June 28th, 2021

## 2019-07-25 NOTE — Telephone Encounter (Signed)
Approved today Your request has been approved Drug Zolpidem Tartrate 5MG  tablets  Pharm aware

## 2019-08-02 ENCOUNTER — Other Ambulatory Visit: Payer: Self-pay | Admitting: Family Medicine

## 2019-08-02 DIAGNOSIS — E1140 Type 2 diabetes mellitus with diabetic neuropathy, unspecified: Secondary | ICD-10-CM

## 2019-08-02 NOTE — Telephone Encounter (Signed)
[place]. NTBS 30 days given 07/04/19 LOV 10/07/18

## 2019-08-11 ENCOUNTER — Other Ambulatory Visit: Payer: Self-pay | Admitting: Family Medicine

## 2019-08-11 DIAGNOSIS — E1140 Type 2 diabetes mellitus with diabetic neuropathy, unspecified: Secondary | ICD-10-CM

## 2019-08-12 ENCOUNTER — Other Ambulatory Visit: Payer: Self-pay | Admitting: *Deleted

## 2019-08-12 MED ORDER — METOPROLOL SUCCINATE ER 25 MG PO TB24: 25.0000 mg | ORAL_TABLET | Freq: Every day | ORAL | 0 refills | Status: DC

## 2019-08-12 NOTE — Telephone Encounter (Signed)
[place]. NTBS 30 days given 07/04/19 Last A1C 10/07/18

## 2019-08-15 NOTE — Telephone Encounter (Signed)
TTC and no answer, VM not set up. Letter printed and mailed to pt to schedule follow up appt.

## 2019-10-13 ENCOUNTER — Other Ambulatory Visit: Payer: Self-pay | Admitting: Family Medicine

## 2019-10-13 DIAGNOSIS — E1169 Type 2 diabetes mellitus with other specified complication: Secondary | ICD-10-CM

## 2019-10-14 ENCOUNTER — Other Ambulatory Visit: Payer: Self-pay | Admitting: Family Medicine

## 2019-10-14 DIAGNOSIS — E785 Hyperlipidemia, unspecified: Secondary | ICD-10-CM

## 2019-10-14 DIAGNOSIS — E1169 Type 2 diabetes mellitus with other specified complication: Secondary | ICD-10-CM

## 2019-10-24 ENCOUNTER — Other Ambulatory Visit: Payer: Self-pay | Admitting: Family Medicine

## 2019-10-25 ENCOUNTER — Other Ambulatory Visit: Payer: Self-pay | Admitting: *Deleted

## 2019-10-25 DIAGNOSIS — E1140 Type 2 diabetes mellitus with diabetic neuropathy, unspecified: Secondary | ICD-10-CM

## 2019-10-26 ENCOUNTER — Other Ambulatory Visit: Payer: Self-pay

## 2019-10-26 DIAGNOSIS — E1140 Type 2 diabetes mellitus with diabetic neuropathy, unspecified: Secondary | ICD-10-CM

## 2019-10-26 MED ORDER — METFORMIN HCL 1000 MG PO TABS: 1000.0000 mg | ORAL_TABLET | Freq: Two times a day (BID) | ORAL | 0 refills | Status: DC

## 2019-10-31 ENCOUNTER — Other Ambulatory Visit: Payer: Self-pay

## 2019-10-31 ENCOUNTER — Ambulatory Visit (INDEPENDENT_AMBULATORY_CARE_PROVIDER_SITE_OTHER): Payer: Medicare HMO

## 2019-10-31 DIAGNOSIS — Z23 Encounter for immunization: Secondary | ICD-10-CM

## 2019-11-07 ENCOUNTER — Other Ambulatory Visit: Payer: Self-pay | Admitting: Family Medicine

## 2019-11-07 DIAGNOSIS — E1169 Type 2 diabetes mellitus with other specified complication: Secondary | ICD-10-CM

## 2019-11-17 ENCOUNTER — Other Ambulatory Visit: Payer: Self-pay | Admitting: Family Medicine

## 2019-11-17 DIAGNOSIS — E1140 Type 2 diabetes mellitus with diabetic neuropathy, unspecified: Secondary | ICD-10-CM

## 2019-12-05 ENCOUNTER — Other Ambulatory Visit: Payer: Self-pay | Admitting: Family Medicine

## 2019-12-05 DIAGNOSIS — E785 Hyperlipidemia, unspecified: Secondary | ICD-10-CM

## 2019-12-05 DIAGNOSIS — E1169 Type 2 diabetes mellitus with other specified complication: Secondary | ICD-10-CM

## 2019-12-10 ENCOUNTER — Other Ambulatory Visit: Payer: Self-pay | Admitting: Family Medicine

## 2019-12-10 DIAGNOSIS — E1140 Type 2 diabetes mellitus with diabetic neuropathy, unspecified: Secondary | ICD-10-CM

## 2019-12-13 ENCOUNTER — Ambulatory Visit (INDEPENDENT_AMBULATORY_CARE_PROVIDER_SITE_OTHER): Payer: Medicare HMO | Admitting: Family Medicine

## 2019-12-13 ENCOUNTER — Other Ambulatory Visit: Payer: Self-pay

## 2019-12-13 ENCOUNTER — Encounter: Payer: Self-pay | Admitting: Family Medicine

## 2019-12-13 VITALS — BP 135/71 | HR 89 | Temp 98.1°F | Ht 65.0 in | Wt 158.0 lb

## 2019-12-13 DIAGNOSIS — E559 Vitamin D deficiency, unspecified: Secondary | ICD-10-CM | POA: Diagnosis not present

## 2019-12-13 DIAGNOSIS — E1140 Type 2 diabetes mellitus with diabetic neuropathy, unspecified: Secondary | ICD-10-CM

## 2019-12-13 DIAGNOSIS — F5101 Primary insomnia: Secondary | ICD-10-CM

## 2019-12-13 DIAGNOSIS — Z23 Encounter for immunization: Secondary | ICD-10-CM

## 2019-12-13 DIAGNOSIS — Z79899 Other long term (current) drug therapy: Secondary | ICD-10-CM | POA: Diagnosis not present

## 2019-12-13 DIAGNOSIS — E785 Hyperlipidemia, unspecified: Secondary | ICD-10-CM

## 2019-12-13 DIAGNOSIS — R69 Illness, unspecified: Secondary | ICD-10-CM | POA: Diagnosis not present

## 2019-12-13 DIAGNOSIS — E1159 Type 2 diabetes mellitus with other circulatory complications: Secondary | ICD-10-CM

## 2019-12-13 DIAGNOSIS — E1169 Type 2 diabetes mellitus with other specified complication: Secondary | ICD-10-CM

## 2019-12-13 DIAGNOSIS — M62838 Other muscle spasm: Secondary | ICD-10-CM

## 2019-12-13 DIAGNOSIS — I152 Hypertension secondary to endocrine disorders: Secondary | ICD-10-CM | POA: Diagnosis not present

## 2019-12-13 DIAGNOSIS — R6889 Other general symptoms and signs: Secondary | ICD-10-CM | POA: Diagnosis not present

## 2019-12-13 LAB — BAYER DCA HB A1C WAIVED: HB A1C (BAYER DCA - WAIVED): 6.2 % (ref <7.0)

## 2019-12-13 MED ORDER — ZOLPIDEM TARTRATE 5 MG PO TABS: ORAL_TABLET | ORAL | 5 refills | Status: DC

## 2019-12-13 MED ORDER — METOPROLOL SUCCINATE ER 25 MG PO TB24: ORAL_TABLET | ORAL | 3 refills | Status: DC

## 2019-12-13 MED ORDER — METFORMIN HCL 1000 MG PO TABS: 1000.0000 mg | ORAL_TABLET | Freq: Two times a day (BID) | ORAL | 3 refills | Status: DC

## 2019-12-13 MED ORDER — LISINOPRIL 10 MG PO TABS: 10.0000 mg | ORAL_TABLET | Freq: Two times a day (BID) | ORAL | 3 refills | Status: DC

## 2019-12-13 MED ORDER — ATORVASTATIN CALCIUM 40 MG PO TABS: 40.0000 mg | ORAL_TABLET | Freq: Every day | ORAL | 3 refills | Status: DC

## 2019-12-13 NOTE — Patient Instructions (Signed)
Lisinopril changed to 10mg  twice daily. A1c is perfect. Continue current regimen  Get mammogram done  You got your Pneumonia shot today.  See me in 6 months unless you need me sooner.

## 2019-12-13 NOTE — Addendum Note (Signed)
Addended byDory Peru on: 12/13/2019 02:39 PM   Modules accepted: Orders

## 2019-12-13 NOTE — Progress Notes (Signed)
Subjective: CC: DM, HTN, HLD PCP: Janora Norlander, DO Candace Lopez is a 69 y.o. female presenting to clinic today for:  1. Type 2 Diabetes, with hypertension and hyperlipidemia:  Patient reports compliance with Metformin 1000 mg twice daily, lisinopril 10 mg twice daily, metoprolol 25 mg daily, atorvastatin 40 mg daily.  No chest pain, shortness of breath, edema, visual disturbance.  No foot ulcerations.  No polyuria or polydipsia.  She continues to workout regularly  Last eye exam: Up-to-date. Last foot exam: Needs Last A1c:  Lab Results  Component Value Date   HGBA1C 6.2 10/07/2018   Nephropathy screen indicated?:  On ACE inhibitor Last flu, zoster and/or pneumovax: Flu shot needed Immunization History  Administered Date(s) Administered  . Fluad Quad(high Dose 65+) 10/07/2018, 10/31/2019  . Influenza, High Dose Seasonal PF 12/29/2016, 10/21/2017  . Moderna SARS-COVID-2 Vaccination 04/26/2019, 05/24/2019  . Pneumococcal Conjugate-13 08/26/2017  . Tdap 07/16/2012    2.  Insomnia Patient reports compliance with 5 mg of Ambien nightly as needed.  Denies any excessive daytime sedation, falls, visual or auditory hallucinations.  ROS: Per HPI  Allergies  Allergen Reactions  . Tape Rash   Past Medical History:  Diagnosis Date  . DDD (degenerative disc disease), lumbar   . Diabetes mellitus without complication ([place])   . GERD (gastroesophageal reflux disease)   . Hyperlipidemia   . Hypertension   . Urge incontinence of urine     Current Outpatient Medications:  .  aspirin 81 MG chewable tablet, Chew 81 mg by mouth 2 (two) times daily., Disp: , Rfl:  .  atorvastatin (LIPITOR) 40 MG tablet, TAKE ONE TABLET BY MOUTH AT BEDTIME, Disp: 30 tablet, Rfl: 0 .  Blood Glucose Monitoring Suppl (ONE TOUCH ULTRA 2) w/Device KIT, 1 KIT BY DOES NOT APPLY ROUTE ONCE FOR 1 DOSE., Disp: , Rfl: 0 .  lisinopril (ZESTRIL) 20 MG tablet, Take 1 tablet (20 mg total) by mouth daily.,  Disp: 90 tablet, Rfl: 3 .  metFORMIN (GLUCOPHAGE) 1000 MG tablet, Take 1 tablet (1,000 mg total) by mouth 2 (two) times daily with a meal., Disp: 60 tablet, Rfl: 0 .  metoprolol succinate (TOPROL-XL) 25 MG 24 hr tablet, TAKE 1 TABLET BY MOUTH EVERYDAY AT BEDTIME, Disp: 90 tablet, Rfl: 0 .  ONETOUCH DELICA LANCETS 47M MISC, USE TO CHECK BLOOD SUGAR TWICE DAILY., Disp: 200 each, Rfl: 2 .  ONETOUCH ULTRA test strip, CHECK BLOOD SUGAR TWICE DAILY DX 11.40, Disp: 200 strip, Rfl: 3 .  TURMERIC PO, Take 1 capsule by mouth 2 (two) times daily. , Disp: , Rfl:  .  zolpidem (AMBIEN) 5 MG tablet, take 1 tablet(5 mg total) by mouth at bedtime as needed for sleep, Disp: 30 tablet, Rfl: 5 Social History   Socioeconomic History  . Marital status: Married    Spouse name: Not on file  . Number of children: Not on file  . Years of education: Not on file  . Highest education level: Not on file  Occupational History  . Not on file  Tobacco Use  . Smoking status: Never Smoker  . Smokeless tobacco: Never Used  Vaping Use  . Vaping Use: Never used  Substance and Sexual Activity  . Alcohol use: No  . Drug use: No  . Sexual activity: Not on file  Other Topics Concern  . Not on file  Social History Narrative  . Not on file   Social Determinants of Health   Financial Resource Strain:   .  Difficulty of Paying Living Expenses: Not on file  Food Insecurity:   . Worried About Charity fundraiser in the Last Year: Not on file  . Ran Out of Food in the Last Year: Not on file  Transportation Needs:   . Lack of Transportation (Medical): Not on file  . Lack of Transportation (Non-Medical): Not on file  Physical Activity:   . Days of Exercise per Week: Not on file  . Minutes of Exercise per Session: Not on file  Stress:   . Feeling of Stress : Not on file  Social Connections:   . Frequency of Communication with Friends and Family: Not on file  . Frequency of Social Gatherings with Friends and Family: Not  on file  . Attends Religious Services: Not on file  . Active Member of Clubs or Organizations: Not on file  . Attends Archivist Meetings: Not on file  . Marital Status: Not on file  Intimate Partner Violence:   . Fear of Current or Ex-Partner: Not on file  . Emotionally Abused: Not on file  . Physically Abused: Not on file  . Sexually Abused: Not on file   Family History  Problem Relation Age of Onset  . Cancer Mother        lung cancer  . Cancer Father        lymphnodes  . Diabetes Sister   . Diabetes Brother   . Diabetes Son   . Cancer Maternal Aunt        throat  . Cancer Paternal Uncle        unknown   . Cancer Maternal Grandfather   . Heart disease Paternal Grandfather   . Cancer Other        breast  . Colon cancer Neg Hx     Objective: Office vital signs reviewed. BP 135/71   Pulse 89   Temp 98.1 F (36.7 C)   Ht 5' 5"  (1.651 m)   Wt 158 lb (71.7 kg)   SpO2 98%   BMI 26.29 kg/m   Physical Examination:  General: Awake, alert, well nourished, No acute distress HEENT: Normal, sclera white, MMM Cardio: regular rate and rhythm, S1S2 heard, no murmurs appreciated Pulm: clear to auscultation bilaterally, no wheezes, rhonchi or rales; normal work of breathing on room air Extremities: warm, well perfused, No edema, cyanosis or clubbing; +2 pulses bilaterally MSK: normal gait and station; no tenderness to palpation of muscles Skin: dry; intact; no rashes or lesions Neuro: see DM foot Diabetic Foot Exam - Simple   Simple Foot Form Visual Inspection No deformities, no ulcerations, no other skin breakdown bilaterally: Yes Sensation Testing Intact to touch and monofilament testing bilaterally: Yes Pulse Check Posterior Tibialis and Dorsalis pulse intact bilaterally: Yes Comments    Assessment/ Plan: 69 y.o. female   1. Type 2 diabetes mellitus with diabetic neuropathy, without long-term current use of insulin (HCC) Under excellent control. A1c 6.1  today. - Bayer DCA Hb A1c Waived - metFORMIN (GLUCOPHAGE) 1000 MG tablet; Take 1 tablet (1,000 mg total) by mouth 2 (two) times daily with a meal.  Dispense: 180 tablet; Refill: 3  2. Hypertension associated with diabetes ([place]) Controlled.  Continue lisinopril twice daily since this has worked out better for patient.  10 mg have been prescribed to help with ease of medication administration. - lisinopril (ZESTRIL) 10 MG tablet; Take 1 tablet (10 mg total) by mouth in the morning and at bedtime. (dc Lisinopril 20)  Dispense: 180  tablet; Refill: 3  3. Hyperlipidemia associated with type 2 diabetes mellitus (HCC) Continue statin. - CMP14+EGFR - LDL Cholesterol, Direct - atorvastatin (LIPITOR) 40 MG tablet; Take 1 tablet (40 mg total) by mouth at bedtime.  Dispense: 90 tablet; Refill: 3  4. Primary insomnia Stable.  National narcotic database was reviewed and there were no red flags.  Ambien renewed - Drug Screen 10 W/Conf, Se - zolpidem (AMBIEN) 5 MG tablet; take 1 tablet(5 mg total) by mouth at bedtime as needed for sleep  Dispense: 30 tablet; Refill: 5  5. Vitamin D deficiency Check vitamin D - VITAMIN D 25 Hydroxy (Vit-D Deficiency, Fractures)  6. Controlled substance agreement signed Unable to leave urine so blood sample was done - Drug Screen 10 W/Conf, Se  7. Muscle spasms of both lower extremities Uncertain etiology.  Seems like she is hydrating adequately and stretching before exercise.  Check vitamin B and magnesium - Vitamin B12 - Magnesium   Orders Placed This Encounter  Procedures  . Bayer DCA Hb A1c Waived  . VITAMIN D 25 Hydroxy (Vit-D Deficiency, Fractures)  . CMP14+EGFR  . LDL Cholesterol, Direct  . Drug Screen 10 W/Conf, Se   No orders of the defined types were placed in this encounter.    Janora Norlander, DO [place] [phone]

## 2019-12-14 ENCOUNTER — Other Ambulatory Visit: Payer: Self-pay | Admitting: Family Medicine

## 2019-12-14 LAB — MAGNESIUM: Magnesium: 1.2 mg/dL — ABNORMAL LOW (ref 1.6–2.3)

## 2019-12-14 LAB — VITAMIN B12: Vitamin B-12: 281 pg/mL (ref 232–1245)

## 2019-12-14 MED ORDER — VITAMIN D (ERGOCALCIFEROL) 1.25 MG (50000 UNIT) PO CAPS: 50000.0000 [IU] | ORAL_CAPSULE | ORAL | 0 refills | Status: AC

## 2019-12-15 LAB — CMP14+EGFR
ALT: 13 IU/L (ref 0–32)
AST: 15 IU/L (ref 0–40)
Albumin/Globulin Ratio: 2 (ref 1.2–2.2)
Albumin: 4.5 g/dL (ref 3.8–4.8)
Alkaline Phosphatase: 55 IU/L (ref 44–121)
BUN/Creatinine Ratio: 21 (ref 12–28)
BUN: 20 mg/dL (ref 8–27)
Bilirubin Total: 1 mg/dL (ref 0.0–1.2)
CO2: 20 mmol/L (ref 20–29)
Calcium: 10.1 mg/dL (ref 8.7–10.3)
Chloride: 101 mmol/L (ref 96–106)
Creatinine, Ser: 0.97 mg/dL (ref 0.57–1.00)
GFR calc Af Amer: 69 mL/min/{1.73_m2} (ref >59)
GFR calc non Af Amer: 60 mL/min/{1.73_m2} (ref >59)
Globulin, Total: 2.3 g/dL (ref 1.5–4.5)
Glucose: 155 mg/dL — ABNORMAL HIGH (ref 65–99)
Potassium: 4.7 mmol/L (ref 3.5–5.2)
Sodium: 143 mmol/L (ref 134–144)
Total Protein: 6.8 g/dL (ref 6.0–8.5)

## 2019-12-15 LAB — DRUG SCREEN 10 W/CONF, SERUM
Amphetamines, IA: NEGATIVE ng/mL
Barbiturates, IA: NEGATIVE ug/mL
Benzodiazepines, IA: NEGATIVE ng/mL
Cocaine & Metabolite, IA: NEGATIVE ng/mL
Methadone, IA: NEGATIVE ng/mL
Opiates, IA: NEGATIVE ng/mL
Oxycodones, IA: NEGATIVE ng/mL
Phencyclidine, IA: NEGATIVE ng/mL
Propoxyphene, IA: NEGATIVE ng/mL
THC(Marijuana) Metabolite, IA: NEGATIVE ng/mL

## 2019-12-15 LAB — LDL CHOLESTEROL, DIRECT: LDL Direct: 67 mg/dL (ref 0–99)

## 2019-12-15 LAB — VITAMIN D 25 HYDROXY (VIT D DEFICIENCY, FRACTURES): Vit D, 25-Hydroxy: 27.5 ng/mL — ABNORMAL LOW (ref 30.0–100.0)

## 2019-12-20 ENCOUNTER — Other Ambulatory Visit (INDEPENDENT_AMBULATORY_CARE_PROVIDER_SITE_OTHER): Payer: Medicare HMO

## 2019-12-20 ENCOUNTER — Other Ambulatory Visit: Payer: Self-pay

## 2019-12-20 DIAGNOSIS — M8589 Other specified disorders of bone density and structure, multiple sites: Secondary | ICD-10-CM | POA: Diagnosis not present

## 2019-12-20 DIAGNOSIS — Z780 Asymptomatic menopausal state: Secondary | ICD-10-CM

## 2019-12-29 ENCOUNTER — Ambulatory Visit: Payer: Medicare HMO | Admitting: Pharmacist

## 2020-01-02 ENCOUNTER — Telehealth: Payer: Self-pay

## 2020-01-02 ENCOUNTER — Other Ambulatory Visit: Payer: Self-pay | Admitting: Family Medicine

## 2020-01-02 DIAGNOSIS — E785 Hyperlipidemia, unspecified: Secondary | ICD-10-CM

## 2020-01-02 DIAGNOSIS — E1169 Type 2 diabetes mellitus with other specified complication: Secondary | ICD-10-CM

## 2020-01-02 MED ORDER — GABAPENTIN 300 MG PO CAPS: 300.0000 mg | ORAL_CAPSULE | Freq: Three times a day (TID) | ORAL | 0 refills | Status: DC

## 2020-01-02 NOTE — Telephone Encounter (Signed)
Patient states that her hand and feet are burning so bad she cant stand it. Patient does have dx of neuropathy. Patients last visit with you was 12/13/2019 and states yall discussed her Neuropathy then. She is asking if gabapentin be called in or does she need to make another appointment for it? Please advise.

## 2020-01-02 NOTE — Telephone Encounter (Signed)
Rx sent to pharmacy   

## 2020-01-02 NOTE — Telephone Encounter (Signed)
Ok to send gabapentin 300mg  [facility]. #90 no RF.

## 2020-02-04 ENCOUNTER — Other Ambulatory Visit: Payer: Self-pay | Admitting: Family Medicine

## 2020-02-13 ENCOUNTER — Telehealth: Payer: Self-pay | Admitting: *Deleted

## 2020-02-13 ENCOUNTER — Ambulatory Visit (INDEPENDENT_AMBULATORY_CARE_PROVIDER_SITE_OTHER): Payer: Medicare HMO | Admitting: Family Medicine

## 2020-02-13 ENCOUNTER — Other Ambulatory Visit: Payer: Self-pay

## 2020-02-13 DIAGNOSIS — E1140 Type 2 diabetes mellitus with diabetic neuropathy, unspecified: Secondary | ICD-10-CM | POA: Diagnosis not present

## 2020-02-13 DIAGNOSIS — R197 Diarrhea, unspecified: Secondary | ICD-10-CM | POA: Diagnosis not present

## 2020-02-13 DIAGNOSIS — E1169 Type 2 diabetes mellitus with other specified complication: Secondary | ICD-10-CM

## 2020-02-13 DIAGNOSIS — E785 Hyperlipidemia, unspecified: Secondary | ICD-10-CM

## 2020-02-13 MED ORDER — METFORMIN HCL ER 500 MG PO TB24: 1000.0000 mg | ORAL_TABLET | Freq: Every day | ORAL | 0 refills | Status: DC

## 2020-02-13 MED ORDER — ATORVASTATIN CALCIUM 40 MG PO TABS: 40.0000 mg | ORAL_TABLET | Freq: Every day | ORAL | 4 refills | Status: DC

## 2020-02-13 MED ORDER — GABAPENTIN 300 MG PO CAPS: ORAL_CAPSULE | ORAL | 3 refills | Status: DC

## 2020-02-13 NOTE — Progress Notes (Signed)
Telephone visit  Subjective: CC: Follow-up neuropathy PCP: Janora Norlander, DO WNI:OEVOJJ H Sholtz is a 70 y.o. female calls for telephone consult today. Patient provides verbal consent for consult held via phone.  Due to COVID-19 pandemic this visit was conducted virtually. This visit type was conducted due to national recommendations for restrictions regarding the COVID-19 Pandemic (e.g. social distancing, sheltering in place) in an effort to limit this patient's exposure and mitigate transmission in our community. All issues noted in this document were discussed and addressed.  A physical exam was not performed with this format.   Location of patient: Home Location of provider: [facility] Others present for call: None  1.  Neuropathy Patient reports excellent response to the gabapentin.  She has typically been taking this only at bedtime but sometimes does take it up to 3 times daily with good relief.  Denies any excessive daytime sedation or falls.  No reports of swelling  She does report some discoloration of her toenails and she would like to get this checked out.  She has a brother who had amputation and notes that his symptoms started very similarly.  2. diarrhea Patient reports that she has been having diarrhea since before Thanksgiving.  She did go off of the metformin for a couple of days and her diarrhea seemed to resolve.  Sometimes she will wake up with fecal smearing and have fecal accidents.  She goes every day and has loose stools every day.  No reports of blood in stool.   ROS: Per HPI  Allergies  Allergen Reactions  . Tape Rash   Past Medical History:  Diagnosis Date  . DDD (degenerative disc disease), lumbar   . Diabetes mellitus without complication ([place])   . GERD (gastroesophageal reflux disease)   . Hyperlipidemia   . Hypertension   . Urge incontinence of urine     Current Outpatient Medications:  .  aspirin 81 MG chewable tablet, Chew 81 mg by mouth 2 (two)  times daily., Disp: , Rfl:  .  atorvastatin (LIPITOR) 40 MG tablet, TAKE ONE TABLET BY MOUTH AT BEDTIME, Disp: 30 tablet, Rfl: 4 .  Blood Glucose Monitoring Suppl (ONE TOUCH ULTRA 2) w/Device KIT, 1 KIT BY DOES NOT APPLY ROUTE ONCE FOR 1 DOSE., Disp: , Rfl: 0 .  gabapentin (NEURONTIN) 300 MG capsule, TAKE 1 CAPSULE BY MOUTH THREE TIMES A DAY, Disp: 90 capsule, Rfl: 0 .  lisinopril (ZESTRIL) 10 MG tablet, Take 1 tablet (10 mg total) by mouth in the morning and at bedtime. (dc Lisinopril 20), Disp: 180 tablet, Rfl: 3 .  metFORMIN (GLUCOPHAGE) 1000 MG tablet, Take 1 tablet (1,000 mg total) by mouth 2 (two) times daily with a meal., Disp: 180 tablet, Rfl: 3 .  metoprolol succinate (TOPROL-XL) 25 MG 24 hr tablet, TAKE 1 TABLET BY MOUTH EVERYDAY AT BEDTIME, Disp: 90 tablet, Rfl: 3 .  ONETOUCH DELICA LANCETS 00X MISC, USE TO CHECK BLOOD SUGAR TWICE DAILY., Disp: 200 each, Rfl: 2 .  ONETOUCH ULTRA test strip, CHECK BLOOD SUGAR TWICE DAILY DX 11.40, Disp: 200 strip, Rfl: 3 .  TURMERIC PO, Take 1 capsule by mouth 2 (two) times daily. , Disp: , Rfl:  .  zolpidem (AMBIEN) 5 MG tablet, take 1 tablet(5 mg total) by mouth at bedtime as needed for sleep, Disp: 30 tablet, Rfl: 5  Assessment/ Plan: 70 y.o. female   Diarrhea, unspecified type  Type 2 diabetes mellitus with diabetic neuropathy, without long-term current use of insulin (HCC) -  Plan: metFORMIN (GLUCOPHAGE XR) 500 MG 24 hr tablet  Hyperlipidemia associated with type 2 diabetes mellitus ([place]) - Plan: atorvastatin (LIPITOR) 40 MG tablet  Sounds like the diarrhea is medication induced.  I have switched her from plain metformin to extended release metformin in hopes that this might alleviate her symptoms.  We will see each other back in about 2 weeks for her toenails and if symptoms persist at that time plan for full metabolic and fecal work-up  Neuropathy has improved with gabapentin and I have renewed this.  Okay to take 3 times daily if she finds  this helpful and is not overly sedated  Plan for A1c at next visit  Atorvastatin has been sent to her pharmacy  Start time: 9:30am End time: 9:41am  Total time spent on patient care (including telephone call/ virtual visit): 11 minutes  [place], [place] [phone]

## 2020-02-13 NOTE — Telephone Encounter (Signed)
PA in process   Key: [id number] - PA Case ID: [id number] - Rx #: [id number] Need help? Call us at [phone] Status Sent to [facility] Zolpidem Tartrate 5MG  tablets

## 2020-02-13 NOTE — Telephone Encounter (Signed)
This approval authorizes your coverage from 01/28/2020 - 01/26/2021, unless we notify you  otherwise, and as long as the following conditions apply: . you remain enrolled in our Medicare Part D prescription drug plan, . your physician or other prescriber continues to prescribe the medication for you, and . the medication continues to be safe for treating your condition  Pharmacy aware 

## 2020-02-17 NOTE — Telephone Encounter (Signed)
Duplicate note. This one will be closed

## 2020-02-27 ENCOUNTER — Other Ambulatory Visit: Payer: Self-pay

## 2020-02-27 ENCOUNTER — Ambulatory Visit (INDEPENDENT_AMBULATORY_CARE_PROVIDER_SITE_OTHER): Payer: Medicare HMO | Admitting: Family Medicine

## 2020-02-27 ENCOUNTER — Encounter: Payer: Self-pay | Admitting: Family Medicine

## 2020-02-27 VITALS — BP 148/73 | HR 91 | Ht 65.0 in | Wt 160.0 lb

## 2020-02-27 DIAGNOSIS — K521 Toxic gastroenteritis and colitis: Secondary | ICD-10-CM | POA: Diagnosis not present

## 2020-02-27 DIAGNOSIS — L608 Other nail disorders: Secondary | ICD-10-CM | POA: Diagnosis not present

## 2020-02-27 DIAGNOSIS — E1140 Type 2 diabetes mellitus with diabetic neuropathy, unspecified: Secondary | ICD-10-CM | POA: Diagnosis not present

## 2020-02-27 NOTE — Progress Notes (Signed)
Subjective: CC: Nail discoloration, diarrhea PCP: Janora Norlander, DO Candace Lopez is a 70 y.o. female presenting to clinic today for:  1.  Nail discoloration/diarrhea, diabetes Patient reports several months of nail discoloration along the right and left great toenails.  She was worried because her brother had something similar and ultimately required amputation.  She is very physically active.  She monitors her sugar closely and is taking her metformin extended release as directed.  She notes that the extended release has done wonders for her diarrhea and she is had no more accidents.  Denies any foot pain or previous injury.  She often wears black socks.     ROS: Per HPI  Allergies  Allergen Reactions  . Tape Rash   Past Medical History:  Diagnosis Date  . DDD (degenerative disc disease), lumbar   . Diabetes mellitus without complication ([place])   . GERD (gastroesophageal reflux disease)   . Hyperlipidemia   . Hypertension   . Urge incontinence of urine     Current Outpatient Medications:  .  aspirin EC 81 MG tablet, Take 81 mg by mouth in the morning and at bedtime. Swallow whole., Disp: , Rfl:  .  atorvastatin (LIPITOR) 40 MG tablet, Take 1 tablet (40 mg total) by mouth at bedtime., Disp: 30 tablet, Rfl: 4 .  Blood Glucose Monitoring Suppl (ONE TOUCH ULTRA 2) w/Device KIT, 1 KIT BY DOES NOT APPLY ROUTE ONCE FOR 1 DOSE., Disp: , Rfl: 0 .  gabapentin (NEURONTIN) 300 MG capsule, TAKE 1 CAPSULE BY MOUTH THREE TIMES A DAY, Disp: 270 capsule, Rfl: 3 .  lisinopril (ZESTRIL) 10 MG tablet, Take 1 tablet (10 mg total) by mouth in the morning and at bedtime. (dc Lisinopril 20), Disp: 180 tablet, Rfl: 3 .  metFORMIN (GLUCOPHAGE XR) 500 MG 24 hr tablet, Take 2 tablets (1,000 mg total) by mouth daily with breakfast. (discontinue IR Metformin), Disp: 180 tablet, Rfl: 0 .  metoprolol tartrate (LOPRESSOR) 25 MG tablet, Take 25 mg by mouth 2 (two) times daily., Disp: , Rfl:  .   ONETOUCH DELICA LANCETS 73S MISC, USE TO CHECK BLOOD SUGAR TWICE DAILY., Disp: 200 each, Rfl: 2 .  ONETOUCH ULTRA test strip, CHECK BLOOD SUGAR TWICE DAILY DX 11.40, Disp: 200 strip, Rfl: 3 .  TURMERIC PO, Take 1 capsule by mouth 2 (two) times daily. , Disp: , Rfl:  .  zolpidem (AMBIEN) 5 MG tablet, take 1 tablet(5 mg total) by mouth at bedtime as needed for sleep, Disp: 30 tablet, Rfl: 5 Social History   Socioeconomic History  . Marital status: Married    Spouse name: Not on file  . Number of children: Not on file  . Years of education: Not on file  . Highest education level: Not on file  Occupational History  . Not on file  Tobacco Use  . Smoking status: Never Smoker  . Smokeless tobacco: Never Used  Vaping Use  . Vaping Use: Never used  Substance and Sexual Activity  . Alcohol use: No  . Drug use: No  . Sexual activity: Not on file  Other Topics Concern  . Not on file  Social History Narrative  . Not on file   Social Determinants of Health   Financial Resource Strain: Not on file  Food Insecurity: Not on file  Transportation Needs: Not on file  Physical Activity: Not on file  Stress: Not on file  Social Connections: Not on file  Intimate Partner Violence: Not on  file   Family History  Problem Relation Age of Onset  . Cancer Mother        lung cancer  . Cancer Father        lymphnodes  . Diabetes Sister   . Diabetes Brother   . Diabetes Son   . Cancer Maternal Aunt        throat  . Cancer Paternal Uncle        unknown   . Cancer Maternal Grandfather   . Heart disease Paternal Grandfather   . Cancer Other        breast  . Colon cancer Neg Hx     Objective: Office vital signs reviewed. BP (!) 148/73   Pulse 91   Ht 5' 5"  (1.651 m)   Wt 160 lb (72.6 kg)   SpO2 95%   BMI 26.63 kg/m   Physical Examination:  General: Awake, alert, well nourished, No acute distress Cardio: regular rate   Pulm:  normal work of breathing on room air Extremities: warm,  well perfused, No edema, cyanosis or clubbing; +2 pulses bilaterally Skin: She has thickening and hyperpigmentation of the right lateral aspect of her great toenail.  She has similar findings along the left medial aspect of the great toenail and along the third digit on the right toe.  Assessment/ Plan: 70 y.o. female   Diarrhea due to drug  Nail discoloration  Type 2 diabetes mellitus with diabetic neuropathy, without long-term current use of insulin (HCC)  Diarrhea has totally resolved with changing the immediate release Metformin to extended release.  Plan for A1c check at next visit  Nail discoloration appears to be fungal.  I offered Lamisil but she would like to pursue topical treatments prior.  We discussed sensitization of the socks and shoes.  Sugar has been stable.  Blood pressures controlled.  Continue all medications as directed.  Follow-up in 3 months  No orders of the defined types were placed in this encounter.  No orders of the defined types were placed in this encounter.    Janora Norlander, DO [place] [phone]

## 2020-02-27 NOTE — Patient Instructions (Signed)
We talked about over-the-counter nail tinctures.  Lamisil I believe makes a paint on nail tincture.  Apply daily as directed.  May take several months before this resolves.  Discussed keeping the feet clean, dry and sterilizing socks and shoes to prevent reinfection.  Fungal Nail Infection A fungal nail infection is a common infection of the toenails or fingernails. This condition affects toenails more often than fingernails. It often affects the great, or big, toes. More than one nail may be infected. The condition can be passed from person to person (is contagious). What are the causes? This condition is caused by a fungus. Several types of fungi can cause the infection. These fungi are common in moist and warm areas. If your hands or feet come into contact with the fungus, it may get into a crack in your fingernail or toenail and cause the infection. What increases the risk? The following factors may make you more likely to develop this condition:  Being female.  Being of older age.  Living with someone who has the fungus.  Walking barefoot in areas where the fungus thrives, such as showers or locker rooms.  Wearing shoes and socks that cause your feet to sweat.  Having a nail injury or a recent nail surgery.  Having certain medical conditions, such as: ? Athlete's foot. ? Diabetes. ? Psoriasis. ? Poor circulation. ? A weak body defense system (immune system). What are the signs or symptoms? Symptoms of this condition include:  A pale spot on the nail.  Thickening of the nail.  A nail that becomes yellow or brown.  A brittle or ragged nail edge.  A crumbling nail.  A nail that has lifted away from the nail bed.   How is this diagnosed? This condition is diagnosed with a physical exam. Your health care provider may take a scraping or clipping from your nail to test for the fungus. How is this treated? Treatment is not needed for mild infections. If you have significant  nail changes, treatment may include:  Antifungal medicines taken by mouth (orally). You may need to take the medicine for several weeks or several months, and you may not see the results for a long time. These medicines can cause side effects. Ask your health care provider what problems to watch for.  Antifungal nail polish or nail cream. These may be used along with oral antifungal medicines.  Laser treatment of the nail.  Surgery to remove the nail. This may be needed for the most severe infections. It can take a long time, usually up to a year, for the infection to go away. The infection may also come back.   Follow these instructions at home: Medicines  Take or apply over-the-counter and prescription medicines only as told by your health care provider.  Ask your health care provider about using over-the-counter mentholated ointment on your nails. Nail care  Trim your nails often.  Wash and dry your hands and feet every day.  Keep your feet dry: ? Wear absorbent socks, and change your socks frequently. ? Wear shoes that allow air to circulate, such as sandals or canvas tennis shoes. Throw out old shoes.  Do not use artificial nails.  If you go to a nail salon, make sure you choose one that uses clean instruments.  Use antifungal foot powder on your feet and in your shoes. General instructions  Do not share personal items, such as towels or nail clippers.  Do not walk barefoot in shower rooms  or locker rooms.  Wear rubber gloves if you are working with your hands in wet areas.  Keep all follow-up visits as told by your health care provider. This is important. Contact a health care provider if: Your infection is not getting better or it is getting worse after several months. Summary  A fungal nail infection is a common infection of the toenails or fingernails.  Treatment is not needed for mild infections. If you have significant nail changes, treatment may include  taking medicine orally and applying medicine to your nails.  It can take a long time, usually up to a year, for the infection to go away. The infection may also come back.  Take or apply over-the-counter and prescription medicines only as told by your health care provider.  Follow instructions for taking care of your nails to help prevent infection from coming back or spreading. This information is not intended to replace advice given to you by your health care provider. Make sure you discuss any questions you have with your health care provider. Document Revised: 05/06/2018 Document Reviewed: 06/19/2017 Elsevier Patient Education  2021 [employer].

## 2020-03-01 ENCOUNTER — Other Ambulatory Visit: Payer: Self-pay | Admitting: Family Medicine

## 2020-03-02 ENCOUNTER — Other Ambulatory Visit: Payer: Self-pay | Admitting: Family Medicine

## 2020-03-15 ENCOUNTER — Other Ambulatory Visit: Payer: Self-pay | Admitting: Family Medicine

## 2020-03-15 DIAGNOSIS — E1140 Type 2 diabetes mellitus with diabetic neuropathy, unspecified: Secondary | ICD-10-CM

## 2020-03-15 DIAGNOSIS — E785 Hyperlipidemia, unspecified: Secondary | ICD-10-CM

## 2020-03-15 DIAGNOSIS — E1169 Type 2 diabetes mellitus with other specified complication: Secondary | ICD-10-CM

## 2020-06-16 ENCOUNTER — Other Ambulatory Visit: Payer: Self-pay | Admitting: Family Medicine

## 2020-06-16 DIAGNOSIS — F5101 Primary insomnia: Secondary | ICD-10-CM

## 2020-06-16 DIAGNOSIS — E1140 Type 2 diabetes mellitus with diabetic neuropathy, unspecified: Secondary | ICD-10-CM

## 2020-06-22 ENCOUNTER — Other Ambulatory Visit: Payer: Self-pay | Admitting: Family Medicine

## 2020-06-22 DIAGNOSIS — F5101 Primary insomnia: Secondary | ICD-10-CM

## 2020-06-26 ENCOUNTER — Telehealth: Payer: Self-pay | Admitting: Family Medicine

## 2020-06-26 NOTE — Telephone Encounter (Signed)
  Prescription Request  06/26/2020  What is the name of the medication or equipment? ambien  Have you contacted your pharmacy to request a refill? (if applicable) yes  Which pharmacy would you [facility] this sent to? [facility]   Patient notified that their request is being sent to the clinical staff for review and that they should receive a response within 2 business days.

## 2020-06-26 NOTE — Telephone Encounter (Signed)
Patient needs appointment to refill.

## 2020-07-22 ENCOUNTER — Other Ambulatory Visit: Payer: Self-pay | Admitting: Family Medicine

## 2020-07-22 DIAGNOSIS — E1140 Type 2 diabetes mellitus with diabetic neuropathy, unspecified: Secondary | ICD-10-CM

## 2020-07-27 ENCOUNTER — Telehealth: Payer: Self-pay | Admitting: Family Medicine

## 2020-07-27 NOTE — Telephone Encounter (Signed)
REFERRAL REQUEST Telephone Note  Have you been seen at our office for this problem? yes (Advise that they may need an appointment with their PCP before a referral can be done)  Reason for Referral: Colonoscopy Referral discussed with patient: yes Best contact number of patient for referral team:   #[phone] Has patient been seen by a specialist for this issue before:  Patient provider preference for referral:  Patient location preference for referral: [place]   Patient notified that referrals can take up to a week or longer to process. If they haven't heard anything within a week they should call back and speak with the referral department.    Gottschalk's pt.  And she wants a Mammogram done in [place], too!

## 2020-08-02 ENCOUNTER — Other Ambulatory Visit: Payer: Self-pay | Admitting: Family

## 2020-08-02 DIAGNOSIS — Z1211 Encounter for screening for malignant neoplasm of colon: Secondary | ICD-10-CM

## 2020-08-02 NOTE — Progress Notes (Signed)
Referral placed.

## 2020-08-17 ENCOUNTER — Other Ambulatory Visit: Payer: Self-pay | Admitting: Family Medicine

## 2020-08-17 DIAGNOSIS — E1140 Type 2 diabetes mellitus with diabetic neuropathy, unspecified: Secondary | ICD-10-CM

## 2020-08-28 ENCOUNTER — Ambulatory Visit (INDEPENDENT_AMBULATORY_CARE_PROVIDER_SITE_OTHER): Payer: Medicare HMO | Admitting: Family Medicine

## 2020-08-28 ENCOUNTER — Encounter: Payer: Self-pay | Admitting: Family Medicine

## 2020-08-28 ENCOUNTER — Other Ambulatory Visit: Payer: Self-pay

## 2020-08-28 ENCOUNTER — Other Ambulatory Visit (HOSPITAL_COMMUNITY): Payer: Self-pay | Admitting: Family Medicine

## 2020-08-28 VITALS — BP 123/73 | HR 70 | Temp 97.9°F | Ht 65.0 in | Wt 159.2 lb

## 2020-08-28 DIAGNOSIS — E1140 Type 2 diabetes mellitus with diabetic neuropathy, unspecified: Secondary | ICD-10-CM | POA: Diagnosis not present

## 2020-08-28 DIAGNOSIS — F5101 Primary insomnia: Secondary | ICD-10-CM

## 2020-08-28 DIAGNOSIS — Z79899 Other long term (current) drug therapy: Secondary | ICD-10-CM | POA: Diagnosis not present

## 2020-08-28 DIAGNOSIS — E785 Hyperlipidemia, unspecified: Secondary | ICD-10-CM

## 2020-08-28 DIAGNOSIS — I152 Hypertension secondary to endocrine disorders: Secondary | ICD-10-CM | POA: Diagnosis not present

## 2020-08-28 DIAGNOSIS — E1169 Type 2 diabetes mellitus with other specified complication: Secondary | ICD-10-CM

## 2020-08-28 DIAGNOSIS — R69 Illness, unspecified: Secondary | ICD-10-CM | POA: Diagnosis not present

## 2020-08-28 DIAGNOSIS — E1159 Type 2 diabetes mellitus with other circulatory complications: Secondary | ICD-10-CM

## 2020-08-28 DIAGNOSIS — Z1231 Encounter for screening mammogram for malignant neoplasm of breast: Secondary | ICD-10-CM

## 2020-08-28 LAB — BAYER DCA HB A1C WAIVED: HB A1C (BAYER DCA - WAIVED): 7.9 % — ABNORMAL HIGH (ref <7.0)

## 2020-08-28 MED ORDER — ALPHA-LIPOIC ACID 600 MG PO CAPS: 600.0000 mg | ORAL_CAPSULE | Freq: Every day | ORAL | 3 refills | Status: AC

## 2020-08-28 MED ORDER — ZOLPIDEM TARTRATE 5 MG PO TABS: ORAL_TABLET | ORAL | 5 refills | Status: DC

## 2020-08-28 MED ORDER — LINAGLIPTIN 5 MG PO TABS: 5.0000 mg | ORAL_TABLET | Freq: Every day | ORAL | 0 refills | Status: DC

## 2020-08-28 NOTE — Progress Notes (Signed)
Subjective: CC:DM, insomnia PCP: Janora Norlander, DO YYT:KPTWSF H Felker is a 70 y.o. female presenting to clinic today for:  1. Type 2 Diabetes with hypertension, hyperlipidemia:  Admits that she is not exercising regularly since her spouse had back surgery and has needed her for help at home.  She is compliant with meds but admits that she is not eating the right things.  No visual disturbance. No CP, SOB.  Using gabapentin 600-973m QHS for neuropathy.  She admits that it does not always control her symptoms.  Last eye exam: needs Last foot exam: needs Last A1c:  Lab Results  Component Value Date   HGBA1C 6.2 12/13/2019   Nephropathy screen indicated?: UTD Last flu, zoster and/or pneumovax:  Immunization History  Administered Date(s) Administered   Fluad Quad(high Dose 65+) 10/07/2018, 10/31/2019   Influenza, High Dose Seasonal PF 12/29/2016, 10/21/2017   Moderna Sars-Covid-2 Vaccination 04/26/2019, 05/24/2019, 01/24/2020   Pneumococcal Conjugate-13 08/26/2017   Pneumococcal Polysaccharide-23 12/13/2019   Tdap 07/16/2012    2. Insomnia Using ambien 526mfairly regularly but she has been out for a while now. No excessive daytime sleepiness, sleep walking, falls.  ROS: Per HPI  Allergies  Allergen Reactions   Tape Rash   Past Medical History:  Diagnosis Date   DDD (degenerative disc disease), lumbar    Diabetes mellitus without complication (HCC)    GERD (gastroesophageal reflux disease)    Hyperlipidemia    Hypertension    Urge incontinence of urine     Current Outpatient Medications:    aspirin EC 81 MG tablet, Take 81 mg by mouth in the morning and at bedtime. Swallow whole., Disp: , Rfl:    atorvastatin (LIPITOR) 40 MG tablet, TAKE 1 TABLET BY MOUTH EVERYDAY AT BEDTIME, Disp: 90 tablet, Rfl: 1   Blood Glucose Monitoring Suppl (ONE TOUCH ULTRA 2) w/Device KIT, 1 KIT BY DOES NOT APPLY ROUTE ONCE FOR 1 DOSE., Disp: , Rfl: 0   gabapentin (NEURONTIN) 300 MG  capsule, TAKE 1 CAPSULE BY MOUTH THREE TIMES A DAY, Disp: 270 capsule, Rfl: 3   lisinopril (ZESTRIL) 10 MG tablet, Take 1 tablet (10 mg total) by mouth in the morning and at bedtime. (dc Lisinopril 20), Disp: 180 tablet, Rfl: 3   metFORMIN (GLUCOPHAGE-XR) 500 MG 24 hr tablet, TAKE 2 TABLETS BY MOUTH EVERY DAY WITH BREAKFAST, Disp: 180 tablet, Rfl: 1   metoprolol tartrate (LOPRESSOR) 25 MG tablet, Take 25 mg by mouth 2 (two) times daily., Disp: , Rfl:    OneTouch Delica Lancets 3068LISC, Check BS BID Dx E11.40, Disp: 200 each, Rfl: 3   ONETOUCH ULTRA test strip, CHECK BLOOD SUGAR TWICE DAILY DX 11.40, Disp: 200 strip, Rfl: 3   TURMERIC PO, Take 1 capsule by mouth 2 (two) times daily. , Disp: , Rfl:    zolpidem (AMBIEN) 5 MG tablet, take 1 tablet(5 mg total) by mouth at bedtime as needed for sleep, Disp: 30 tablet, Rfl: 5 Social History   Socioeconomic History   Marital status: Married    Spouse name: Not on file   Number of children: Not on file   Years of education: Not on file   Highest education level: Not on file  Occupational History   Not on file  Tobacco Use   Smoking status: Never   Smokeless tobacco: Never  Vaping Use   Vaping Use: Never used  Substance and Sexual Activity   Alcohol use: No   Drug use: No   Sexual  activity: Not on file  Other Topics Concern   Not on file  Social History Narrative   Not on file   Social Determinants of Health   Financial Resource Strain: Not on file  Food Insecurity: Not on file  Transportation Needs: Not on file  Physical Activity: Not on file  Stress: Not on file  Social Connections: Not on file  Intimate Partner Violence: Not on file   Family History  Problem Relation Age of Onset   Cancer Mother        lung cancer   Cancer Father        lymphnodes   Diabetes Sister    Diabetes Brother    Diabetes Son    Cancer Maternal Aunt        throat   Cancer Paternal Uncle        unknown    Cancer Maternal Grandfather    Heart  disease Paternal Grandfather    Cancer Other        breast   Colon cancer Neg Hx     Objective: Office vital signs reviewed. BP 123/73   Pulse 70   Temp 97.9 F (36.6 C)   Ht 5' 5"  (1.651 m)   Wt 159 lb 3.2 oz (72.2 kg)   SpO2 96%   BMI 26.49 kg/m   Physical Examination:  General: Awake, alert, well nourished, No acute distress HEENT: Normal, sclera white, MMM Cardio: regular rate and rhythm, S1S2 heard, no murmurs appreciated Pulm: clear to auscultation bilaterally, no wheezes, rhonchi or rales; normal work of breathing on room air Extremities: warm, well perfused, No edema, cyanosis or clubbing; +2 pulses bilaterally  Assessment/ Plan: 70 y.o. female   Type 2 diabetes mellitus with diabetic neuropathy, without long-term current use of insulin (HCC) - Plan: Bayer DCA Hb A1c Waived, CMP14+EGFR, Alpha-Lipoic Acid 600 MG CAPS, linagliptin (TRADJENTA) 5 MG TABS tablet  Hyperlipidemia associated with type 2 diabetes mellitus ([place]) - Plan: CMP14+EGFR  Hypertension associated with diabetes ([place]) - Plan: CMP14+EGFR  Hypomagnesemia - Plan: Magnesium  Primary insomnia - Plan: Drug Screen 10 W/Conf, Se, zolpidem (AMBIEN) 5 MG tablet  Controlled substance agreement signed - Plan: Drug Screen 10 W/Conf, Se  DM not controlled now. Add Tradjenta.  Plan for combo pill.  See Almyra Free in 3 weeks for recheck and new rx. Continue metformin xr 1073m for now. DM foot exam next visit. Trial of alpha lipoic acid 6070mfor neuropathy  Continue statin. Did not need refill. Not due for lipid panel  BP controlled. No changes.  Check mg level.  Sleep is not controlled due to lapse in refills. [facility] and drug screen obtained. Ambien renewed. The Narcotic Database has been reviewed.  There were no red flags.    3 month f/u for DM  No orders of the defined types were placed in this encounter.  No orders of the defined types were placed in this encounter.    AsJanora NorlanderDO WeGreenwood [phone]

## 2020-08-28 NOTE — Patient Instructions (Signed)
Sugar up 7.9.  Start Tradjenta daily.  Continue Metformin.  See julie in 3 weeks and she will work on getting this for free/ cheap for you.

## 2020-08-29 NOTE — Progress Notes (Signed)
Pt aware.

## 2020-09-03 LAB — DRUG SCREEN 10 W/CONF, SERUM
Amphetamines, IA: NEGATIVE ng/mL
Barbiturates, IA: NEGATIVE ug/mL
Benzodiazepines, IA: NEGATIVE ng/mL
Cocaine & Metabolite, IA: NEGATIVE ng/mL
Methadone, IA: NEGATIVE ng/mL
Opiates, IA: NEGATIVE ng/mL
Oxycodones, IA: NEGATIVE ng/mL
Phencyclidine, IA: NEGATIVE ng/mL
Propoxyphene, IA: NEGATIVE ng/mL
THC(Marijuana) Metabolite, IA: NEGATIVE ng/mL

## 2020-09-03 LAB — CMP14+EGFR
ALT: 17 IU/L (ref 0–32)
AST: 16 IU/L (ref 0–40)
Albumin/Globulin Ratio: 2.7 — ABNORMAL HIGH (ref 1.2–2.2)
Albumin: 4.8 g/dL (ref 3.8–4.8)
Alkaline Phosphatase: 69 IU/L (ref 44–121)
BUN/Creatinine Ratio: 18 (ref 12–28)
BUN: 17 mg/dL (ref 8–27)
Bilirubin Total: 1.5 mg/dL — ABNORMAL HIGH (ref 0.0–1.2)
CO2: 22 mmol/L (ref 20–29)
Calcium: 9.6 mg/dL (ref 8.7–10.3)
Chloride: 104 mmol/L (ref 96–106)
Creatinine, Ser: 0.92 mg/dL (ref 0.57–1.00)
Globulin, Total: 1.8 g/dL (ref 1.5–4.5)
Glucose: 174 mg/dL — ABNORMAL HIGH (ref 65–99)
Potassium: 4.5 mmol/L (ref 3.5–5.2)
Sodium: 140 mmol/L (ref 134–144)
Total Protein: 6.6 g/dL (ref 6.0–8.5)
eGFR: 68 mL/min/{1.73_m2} (ref >59)

## 2020-09-03 LAB — MAGNESIUM: Magnesium: 1.7 mg/dL (ref 1.6–2.3)

## 2020-09-05 ENCOUNTER — Other Ambulatory Visit (HOSPITAL_COMMUNITY): Payer: Self-pay | Admitting: Family Medicine

## 2020-09-05 ENCOUNTER — Ambulatory Visit: Payer: Medicare HMO

## 2020-09-05 DIAGNOSIS — N63 Unspecified lump in unspecified breast: Secondary | ICD-10-CM

## 2020-09-06 ENCOUNTER — Ambulatory Visit (INDEPENDENT_AMBULATORY_CARE_PROVIDER_SITE_OTHER): Payer: Medicare HMO

## 2020-09-06 VITALS — Ht 65.0 in | Wt 159.0 lb

## 2020-09-06 DIAGNOSIS — Z0000 Encounter for general adult medical examination without abnormal findings: Secondary | ICD-10-CM | POA: Diagnosis not present

## 2020-09-06 DIAGNOSIS — E669 Obesity, unspecified: Secondary | ICD-10-CM | POA: Insufficient documentation

## 2020-09-06 NOTE — Progress Notes (Signed)
Subjective:   Candace Lopez is a 70 y.o. female who presents for an Initial Medicare Annual Wellness Visit.  Virtual Visit via Telephone Note  I connected with  Candace Lopez on 09/06/20 at  2:45 PM EDT by telephone and verified that I am speaking with the correct person using two identifiers.  Location: Patient: Home Provider: [facility] Persons participating in the virtual visit: patient/Nurse Health Advisor   I discussed the limitations, risks, security and privacy concerns of performing an evaluation and management service by telephone and the availability of in person appointments. The patient expressed understanding and agreed to proceed.  Interactive audio and video telecommunications were attempted between this nurse and patient, however failed, due to patient having technical difficulties OR patient did not have access to video capability.  We continued and completed visit with audio only.  Some vital signs may be absent or patient reported.   Candace Lopez Candace Eiliana Drone, LPN   Review of Systems     Cardiac Risk Factors include: advanced age (>68mn, >>58women);diabetes mellitus;dyslipidemia;hypertension;sedentary lifestyle     Objective:    Today's Vitals   09/06/20 1418  Weight: 159 lb (72.1 kg)  Height: 5' 5"  (1.651 m)   Body mass index is 26.46 kg/m.  Advanced Directives 09/06/2020 09/22/2018  Does Patient Have a Medical Advance Directive? No No  Would patient like information on creating a medical advance directive? No - Patient declined No - Patient declined    Current Medications (verified) Outpatient Encounter Medications as of 09/06/2020  Medication Sig   Alpha-Lipoic Acid 600 MG CAPS Take 1 capsule (600 mg total) by mouth daily. For diabetic neuropathy   aspirin EC 81 MG tablet Take 81 mg by mouth in the morning and at bedtime. Swallow whole.   atorvastatin (LIPITOR) 40 MG tablet TAKE 1 TABLET BY MOUTH EVERYDAY AT BEDTIME   Blood Glucose Monitoring Suppl (ONE TOUCH  ULTRA 2) w/Device KIT 1 KIT BY DOES NOT APPLY ROUTE ONCE FOR 1 DOSE.   gabapentin (NEURONTIN) 300 MG capsule TAKE 1 CAPSULE BY MOUTH THREE TIMES A DAY   linagliptin (TRADJENTA) 5 MG TABS tablet Take 1 tablet (5 mg total) by mouth daily.   lisinopril (ZESTRIL) 10 MG tablet Take 1 tablet (10 mg total) by mouth in the morning and at bedtime. (dc Lisinopril 20)   metFORMIN (GLUCOPHAGE-XR) 500 MG 24 hr tablet TAKE 2 TABLETS BY MOUTH EVERY DAY WITH BREAKFAST   metoprolol tartrate (LOPRESSOR) 25 MG tablet Take 25 mg by mouth 2 (two) times daily.   OneTouch Delica Lancets 316XMISC Check BS BID Dx E11.40   ONETOUCH ULTRA test strip CHECK BLOOD SUGAR TWICE DAILY DX 11.40   TURMERIC PO Take 1 capsule by mouth 2 (two) times daily.    zolpidem (AMBIEN) 5 MG tablet take 1 tablet(5 mg total) by mouth at bedtime as needed for sleep   No facility-administered encounter medications on file as of 09/06/2020.    Allergies (verified) Tape   History: Past Medical History:  Diagnosis Date   DDD (degenerative disc disease), lumbar    Diabetes mellitus without complication (HCC)    GERD (gastroesophageal reflux disease)    Hyperlipidemia    Hypertension    Urge incontinence of urine    Past Surgical History:  Procedure Laterality Date   ABDOMINAL HYSTERECTOMY     arm surgery     COLONOSCOPY N/A 09/22/2018   Procedure: COLONOSCOPY;  Surgeon: RRogene Houston MD;  [facility]: [facility];  Service: Endoscopy;  Laterality: N/A;  44   FOOT SURGERY     KNEE SURGERY     Family History  Problem Relation Age of Onset   Cancer Mother        lung cancer   Cancer Father        lymphnodes   Diabetes Sister    Diabetes Brother    Diabetes Son    Cancer Maternal Aunt        throat   Cancer Paternal Uncle        unknown    Cancer Maternal Grandfather    Heart disease Paternal Grandfather    Cancer Other        breast   Colon cancer Neg Hx    Social History   Socioeconomic History   Marital status:  Married    Spouse name: Candace Lopez   Number of children: 2   Years of education: Not on file   Highest education level: Not on file  Occupational History   Occupation: retired  Tobacco Use   Smoking status: Never   Smokeless tobacco: Never  Vaping Use   Vaping Use: Never used  Substance and Sexual Activity   Alcohol use: No   Drug use: No   Sexual activity: Not on file  Other Topics Concern   Not on file  Social History Narrative   2 children  - son lives in [place], daughter 15 minutes away.   She and her husband work out at [employer] 3-4 x per week.   Social Determinants of Health   Financial Resource Strain: Low Risk    Difficulty of Paying Living Expenses: Not hard at all  Food Insecurity: No Food Insecurity   Worried About Charity fundraiser in the Last Year: Never true   [place] in the Last Year: Never true  Transportation Needs: No Transportation Needs   Lack of Transportation (Medical): No   Lack of Transportation (Non-Medical): No  Physical Activity: Insufficiently Active   Days of Exercise per Week: 3 days   Minutes of Exercise per Session: 30 min  Stress: No Stress Concern Present   Feeling of Stress : Not at all  Social Connections: Moderately Isolated   Frequency of Communication with Friends and Family: More than three times a week   Frequency of Social Gatherings with Friends and Family: More than three times a week   Attends Religious Services: Never   Marine scientist or Organizations: No   Attends Music therapist: Never   Marital Status: Married    Tobacco Counseling Counseling given: Not Answered   Clinical Intake:  Pre-visit preparation completed: Yes  Pain : No/denies pain     BMI - recorded: 26.46 Nutritional Status: BMI 25 -29 Overweight Nutritional Risks: None Diabetes: Yes CBG done?: No Did pt. bring in CBG monitor from home?: No  How often do you need to have someone help you when you read  instructions, pamphlets, or other written materials from your doctor or pharmacy?: 1 - Never  Nutrition Risk Assessment:  Has the patient had any N/V/D within the last 2 months?  No  Does the patient have any non-healing wounds?  No  Has the patient had any unintentional weight loss or weight gain?  No   Diabetes:  Is the patient diabetic?  Yes  If diabetic, was a CBG obtained today?  No  Did the patient bring in their glucometer from home?  No  How often do you monitor your CBG's? Once daily fasting - 119 this am per patient.   Financial Strains and Diabetes Management:  Are you having any financial strains with the device, your supplies or your medication? No .  Does the patient want to be seen by Chronic Care Management for management of their diabetes?  No  Would the patient like to be referred to a Nutritionist or for Diabetic Management?  No   Diabetic Exams:  Diabetic Eye Exam: Completed 06/2020.   Diabetic Foot Exam: Completed 04/05/2018. Pt has been advised about the importance in completing this exam. Pt is scheduled for diabetic foot exam on 11/27/2020.    Interpreter Needed?: No  Information entered by :: Ashish Rossetti, LPN   Activities of Daily Living In your present state of health, do you have any difficulty performing the following activities: 09/06/2020  Hearing? N  Vision? N  Difficulty concentrating or making decisions? Y  Walking or climbing stairs? N  Dressing or bathing? N  Doing errands, shopping? N  Preparing Food and eating ? N  Using the Toilet? N  In the past six months, have you accidently leaked urine? N  Do you have problems with loss of bowel control? N  Managing your Medications? N  Managing your Finances? N  Housekeeping or managing your Housekeeping? N  Some recent data might be hidden    Patient Care Team: Janora Norlander, DO as PCP - General (Family Medicine)  Indicate any recent Medical Services you may have received from other  than [facility] providers in the past year (date may be approximate).     Assessment:   This is a routine wellness examination for Elesa.  Hearing/Vision screen Hearing Screening - Comments:: Denies hearing difficulties  Vision Screening - Comments:: Wears reading glasses prn only - up to date with annual eye exams at [facility] issues and exercise activities discussed: Current Exercise Habits: Home exercise routine, Type of exercise: walking;strength training/weights;stretching (Goes to [employer]), Time (Minutes): 30, Frequency (Times/Week): 3, Weekly Exercise (Minutes/Week): 90, Intensity: Moderate, Exercise limited by: neurologic condition(s)   Goals Addressed             This Visit's Progress    Exercise 3x per week (30 min per time)         Depression Screen PHQ 2/9 Scores 09/06/2020 08/28/2020 02/27/2020 12/13/2019 07/25/2019 04/04/2019 04/05/2018  PHQ - 2 Score 0 0 0 0 0 0 0  PHQ- 9 Score 4 6 - 0 - 1 0    Fall Risk Fall Risk  09/06/2020 08/28/2020 02/27/2020 12/13/2019 07/25/2019  Falls in the past year? 0 0 0 0 0  Number falls in past yr: 0 - - - -  Injury with Fall? 0 - - - -  Risk for fall due to : Orthopedic patient;Medication side effect - - - -  Follow up Education provided;Falls prevention discussed - - - -    FALL RISK PREVENTION PERTAINING TO THE HOME:  Any stairs in or around the home? Yes  If so, are there any without handrails? Yes  only 2 steps from [facility] room into rest of house Home free of loose throw rugs in walkways, pet beds, electrical cords, etc? Yes  Adequate lighting in your home to reduce risk of falls? Yes   ASSISTIVE DEVICES UTILIZED TO PREVENT FALLS:  Life alert? No  Use of a cane, walker or w/c? No  Grab bars in the bathroom? No  Shower chair or bench in shower? No  Elevated toilet seat or a handicapped toilet? No   TIMED UP AND GO:  Was the test performed? No . Telephonic visit  Cognitive Function: MMSE - Mini Mental State  Exam 08/26/2017  Orientation to time 5  Orientation to Place 5  Registration 3  Attention/ Calculation 5  Recall 2  Language- name 2 objects 2  Language- repeat 1  Language- follow 3 step command 3  Language- read & follow direction 1  Write a sentence 1  Copy design 1  Total score 29     6CIT Screen 09/06/2020  What Year? 0 points  What month? 0 points  What time? 0 points  Count back from 20 0 points  Months in reverse 2 points  Repeat phrase 4 points  Total Score 6    Immunizations Immunization History  Administered Date(s) Administered   Fluad Quad(high Dose 65+) 10/07/2018, 10/31/2019   Influenza, High Dose Seasonal PF 12/29/2016, 10/21/2017   Moderna Sars-Covid-2 Vaccination 04/26/2019, 05/24/2019, 01/24/2020   Pneumococcal Conjugate-13 08/26/2017   Pneumococcal Polysaccharide-23 12/13/2019   Tdap 07/16/2012    TDAP status: Up to date  Flu Vaccine status: Up to date  Pneumococcal vaccine status: Up to date  Covid-19 vaccine status: Completed vaccines  Qualifies for Shingles Vaccine? Yes   Zostavax completed No   Shingrix Completed?: No.    Education has been provided regarding the importance of this vaccine. Patient has been advised to call insurance company to determine out of pocket expense if they have not yet received this vaccine. Advised may also receive vaccine at local pharmacy or Health Dept. Verbalized acceptance and understanding.  Screening Tests Health Maintenance  Topic Date Due   OPHTHALMOLOGY EXAM  10/28/2019   INFLUENZA VACCINE  08/27/2020   COVID-19 Vaccine (4 - Booster for Moderna series) 09/13/2020 (Originally 04/23/2020)   Zoster Vaccines- Shingrix (1 of 2) 11/28/2020 (Originally 09/01/1969)   FOOT EXAM  08/28/2021 (Originally 04/05/2019)   MAMMOGRAM  08/28/2021 (Originally 07/21/2019)   HEMOGLOBIN A1C  02/28/2021   TETANUS/TDAP  07/17/2022   COLONOSCOPY (Pts 45-20yr Insurance coverage will need to be confirmed)  09/21/2028   DEXA SCAN   Completed   Hepatitis C Screening  Completed   PNA vac Low Risk Adult  Completed   HPV VACCINES  Aged Out    Health Maintenance  Health Maintenance Due  Topic Date Due   OPHTHALMOLOGY EXAM  10/28/2019   INFLUENZA VACCINE  08/27/2020    Colorectal cancer screening: Type of screening: Colonoscopy. Completed 09/22/2018. Repeat every 10 years  Mammogram status: Ordered 09/05/2020. Pt provided with contact info and advised to call to schedule appt.  Says she has to have extra diagnostic testing due to lump or mass  Bone Density status: Completed 12/20/2019. Results reflect: Bone density results: OSTEOPENIA. Repeat every 2 years.  Lung Cancer Screening: (Low Dose CT Chest recommended if Age 466-80years, 30 pack-year currently smoking OR have quit w/in 15years.) does not qualify.  Additional Screening:  Hepatitis C Screening: does qualify; Completed 10/07/2018  Vision Screening: Recommended annual ophthalmology exams for early detection of glaucoma and other disorders of the eye. Is the patient up to date with their annual eye exam?  Yes  Who is the provider or what is the name of the office in which the patient attends annual eye exams? YAnthony SarIf pt is not established with a provider, would they like to be referred to a provider to establish care? No .  Dental Screening: Recommended annual dental exams for proper oral hygiene  Community Resource Referral / Chronic Care Management: CRR required this visit?  No   CCM required this visit?  No      Plan:     I have personally reviewed and noted the following in the patient's chart:   Medical and social history Use of alcohol, tobacco or illicit drugs  Current medications and supplements including opioid prescriptions. Patient is not currently taking opioid prescriptions. Functional ability and status Nutritional status Physical activity Advanced directives List of other physicians Hospitalizations, surgeries, and ER visits in  previous 12 months Vitals Screenings to include cognitive, depression, and falls Referrals and appointments  In addition, I have reviewed and discussed with patient certain preventive protocols, quality metrics, and best practice recommendations. A written personalized care plan for preventive services as well as general preventive health recommendations were provided to patient.     Sandrea Hammond, LPN   07/03/3708   Nurse Notes: None

## 2020-09-06 NOTE — Patient Instructions (Signed)
Candace Lopez , Thank you for taking time to come for your Medicare Wellness Visit. I appreciate your ongoing commitment to your health goals. Please review the following plan we discussed and let me know if I can assist you in the future.   Screening recommendations/referrals: Colonoscopy: Done 09/22/2018 - Repeat in 10 years Mammogram: Ordered - extra testing required - appointment 10/09/20 Bone Density: Done 12/20/2019 - Repeat every 2 years Recommended yearly ophthalmology/optometry visit for glaucoma screening and checkup Recommended yearly dental visit for hygiene and checkup  Vaccinations: Influenza vaccine: Done 10/31/2019 - Repeat annually Pneumococcal vaccine: Done 08/26/2017 & 12/13/2019 Tdap vaccine: Done 07/16/2012 - Repeat in 10 years  Shingles vaccine: Due. Patient declined   Covid-19: Done 04/26/19, 05/24/2019, & 01/24/2020  Advanced directives: Advance directive discussed with you today. Even though you declined this today, please call our office should you change your mind, and we can give you the proper paperwork for you to fill out.   Conditions/risks identified: Aim for 30 minutes of exercise or brisk walking each day, drink 6-8 glasses of water and eat lots of fruits and vegetables.   Next appointment: Follow up in one year for your annual wellness visit    Preventive Care 65 Years and Older, Female Preventive care refers to lifestyle choices and visits with your health care provider that can promote health and wellness. What does preventive care include? A yearly physical exam. This is also called an annual well check. Dental exams once or twice a year. Routine eye exams. Ask your health care provider how often you should have your eyes checked. Personal lifestyle choices, including: Daily care of your teeth and gums. Regular physical activity. Eating a healthy diet. Avoiding tobacco and drug use. Limiting alcohol use. Practicing safe sex. Taking low-dose aspirin  every day. Taking vitamin and mineral supplements as recommended by your health care provider. What happens during an annual well check? The services and screenings done by your health care provider during your annual well check will depend on your age, overall health, lifestyle risk factors, and family history of disease. Counseling  Your health care provider may ask you questions about your: Alcohol use. Tobacco use. Drug use. Emotional well-being. Home and relationship well-being. Sexual activity. Eating habits. History of falls. Memory and ability to understand (cognition). Work and work Astronomer. Reproductive health. Screening  You may have the following tests or measurements: Height, weight, and BMI. Blood pressure. Lipid and cholesterol levels. These may be checked every 5 years, or more frequently if you are over 52 years old. Skin check. Lung cancer screening. You may have this screening every year starting at age 56 if you have a 30-pack-year history of smoking and currently smoke or have quit within the past 15 years. Fecal occult blood test (FOBT) of the stool. You may have this test every year starting at age 50. Flexible sigmoidoscopy or colonoscopy. You may have a sigmoidoscopy every 5 years or a colonoscopy every 10 years starting at age 29. Hepatitis C blood test. Hepatitis B blood test. Sexually transmitted disease (STD) testing. Diabetes screening. This is done by checking your blood sugar (glucose) after you have not eaten for a while (fasting). You may have this done every 1-3 years. Bone density scan. This is done to screen for osteoporosis. You may have this done starting at age 17. Mammogram. This may be done every 1-2 years. Talk to your health care provider about how often you should have regular mammograms. Talk with your  health care provider about your test results, treatment options, and if necessary, the need for more tests. Vaccines  Your health  care provider may recommend certain vaccines, such as: Influenza vaccine. This is recommended every year. Tetanus, diphtheria, and acellular pertussis (Tdap, Td) vaccine. You may need a Td booster every 10 years. Zoster vaccine. You may need this after age 20. Pneumococcal 13-valent conjugate (PCV13) vaccine. One dose is recommended after age 53. Pneumococcal polysaccharide (PPSV23) vaccine. One dose is recommended after age 15. Talk to your health care provider about which screenings and vaccines you need and how often you need them. This information is not intended to replace advice given to you by your health care provider. Make sure you discuss any questions you have with your health care provider. Document Released: 02/09/2015 Document Revised: 10/03/2015 Document Reviewed: 11/14/2014 Elsevier Interactive Patient Education  2017 [place] Prevention in the Home Falls can cause injuries. They can happen to people of all ages. There are many things you can do to make your home safe and to help prevent falls. What can I do on the outside of my home? Regularly fix the edges of walkways and driveways and fix any cracks. Remove anything that might make you trip as you walk through a door, such as a raised step or threshold. Trim any bushes or trees on the path to your home. Use bright outdoor lighting. Clear any walking paths of anything that might make someone trip, such as rocks or tools. Regularly check to see if handrails are loose or broken. Make sure that both sides of any steps have handrails. Any raised decks and porches should have guardrails on the edges. Have any leaves, snow, or ice cleared regularly. Use sand or salt on walking paths during winter. Clean up any spills in your garage right away. This includes oil or grease spills. What can I do in the bathroom? Use night lights. Install grab bars by the toilet and in the tub and shower. Do not use towel bars as grab  bars. Use non-skid mats or decals in the tub or shower. If you need to sit down in the shower, use a plastic, non-slip stool. Keep the floor dry. Clean up any water that spills on the floor as soon as it happens. Remove soap buildup in the tub or shower regularly. Attach bath mats securely with double-sided non-slip rug tape. Do not have throw rugs and other things on the floor that can make you trip. What can I do in the bedroom? Use night lights. Make sure that you have a light by your bed that is easy to reach. Do not use any sheets or blankets that are too big for your bed. They should not hang down onto the floor. Have a firm chair that has side arms. You can use this for support while you get dressed. Do not have throw rugs and other things on the floor that can make you trip. What can I do in the kitchen? Clean up any spills right away. Avoid walking on wet floors. Keep items that you use a lot in easy-to-reach places. If you need to reach something above you, use a strong step stool that has a grab bar. Keep electrical cords out of the way. Do not use floor polish or wax that makes floors slippery. If you must use wax, use non-skid floor wax. Do not have throw rugs and other things on the floor that can make you trip. What  can I do with my stairs? Do not leave any items on the stairs. Make sure that there are handrails on both sides of the stairs and use them. Fix handrails that are broken or loose. Make sure that handrails are as long as the stairways. Check any carpeting to make sure that it is firmly attached to the stairs. Fix any carpet that is loose or worn. Avoid having throw rugs at the top or bottom of the stairs. If you do have throw rugs, attach them to the floor with carpet tape. Make sure that you have a light switch at the top of the stairs and the bottom of the stairs. If you do not have them, ask someone to add them for you. What else can I do to help prevent  falls? Wear shoes that: Do not have high heels. Have rubber bottoms. Are comfortable and fit you well. Are closed at the toe. Do not wear sandals. If you use a stepladder: Make sure that it is fully opened. Do not climb a closed stepladder. Make sure that both sides of the stepladder are locked into place. Ask someone to hold it for you, if possible. Clearly mark and make sure that you can see: Any grab bars or handrails. First and last steps. Where the edge of each step is. Use tools that help you move around (mobility aids) if they are needed. These include: Canes. Walkers. Scooters. Crutches. Turn on the lights when you go into a dark area. Replace any light bulbs as soon as they burn out. Set up your furniture so you have a clear path. Avoid moving your furniture around. If any of your floors are uneven, fix them. If there are any pets around you, be aware of where they are. Review your medicines with your doctor. Some medicines can make you feel dizzy. This can increase your chance of falling. Ask your doctor what other things that you can do to help prevent falls. This information is not intended to replace advice given to you by your health care provider. Make sure you discuss any questions you have with your health care provider. Document Released: 11/09/2008 Document Revised: 06/21/2015 Document Reviewed: 02/17/2014 Elsevier Interactive Patient Education  2017 [employer].

## 2020-09-11 ENCOUNTER — Other Ambulatory Visit (HOSPITAL_COMMUNITY): Payer: Self-pay | Admitting: Family Medicine

## 2020-09-11 ENCOUNTER — Inpatient Hospital Stay
Admission: RE | Admit: 2020-09-11 | Discharge: 2020-09-11 | Disposition: A | Discharge status: Home / Self Care | Payer: Self-pay | Source: Ambulatory Visit | Attending: Family Medicine | Admitting: Family Medicine

## 2020-09-11 DIAGNOSIS — N63 Unspecified lump in unspecified breast: Secondary | ICD-10-CM

## 2020-09-12 ENCOUNTER — Ambulatory Visit (HOSPITAL_COMMUNITY): Payer: Medicare HMO

## 2020-09-19 ENCOUNTER — Other Ambulatory Visit: Payer: Self-pay | Admitting: Family Medicine

## 2020-09-19 DIAGNOSIS — E1169 Type 2 diabetes mellitus with other specified complication: Secondary | ICD-10-CM

## 2020-09-19 DIAGNOSIS — E1159 Type 2 diabetes mellitus with other circulatory complications: Secondary | ICD-10-CM

## 2020-09-19 DIAGNOSIS — I152 Hypertension secondary to endocrine disorders: Secondary | ICD-10-CM

## 2020-09-19 DIAGNOSIS — E785 Hyperlipidemia, unspecified: Secondary | ICD-10-CM

## 2020-09-24 ENCOUNTER — Encounter: Payer: Self-pay | Admitting: Family Medicine

## 2020-09-24 ENCOUNTER — Ambulatory Visit (INDEPENDENT_AMBULATORY_CARE_PROVIDER_SITE_OTHER): Payer: Medicare HMO | Admitting: Family Medicine

## 2020-09-24 ENCOUNTER — Telehealth: Payer: Self-pay | Admitting: Family Medicine

## 2020-09-24 VITALS — BP 138/81 | HR 82 | Temp 97.1°F | Ht 65.0 in | Wt 159.0 lb

## 2020-09-24 DIAGNOSIS — N3000 Acute cystitis without hematuria: Secondary | ICD-10-CM

## 2020-09-24 DIAGNOSIS — R300 Dysuria: Secondary | ICD-10-CM | POA: Diagnosis not present

## 2020-09-24 LAB — MICROSCOPIC EXAMINATION
Renal Epithel, UA: NONE SEEN /hpf
WBC, UA: >30 /hpf — AB (ref 0–5)

## 2020-09-24 LAB — URINALYSIS, ROUTINE W REFLEX MICROSCOPIC
Bilirubin, UA: NEGATIVE
Nitrite, UA: NEGATIVE
Specific Gravity, UA: 1.025 (ref 1.005–1.030)
Urobilinogen, Ur: 0.2 mg/dL (ref 0.2–1.0)
pH, UA: 5.5 (ref 5.0–7.5)

## 2020-09-24 MED ORDER — CEPHALEXIN 500 MG PO CAPS: 500.0000 mg | ORAL_CAPSULE | Freq: Two times a day (BID) | ORAL | 0 refills | Status: AC

## 2020-09-24 NOTE — Progress Notes (Signed)
Subjective: CC: [place] PCP: Janora Norlander, DO ZJI:RCVELF H Dierolf is a 70 y.o. female presenting to clinic today for:  1. Urinary symptoms Patient reports a couple day h/o chills with urination, urinary frequency and urgency with mild dysuria starting today.  No hematuria, fevers, chills, abdominal pain, nausea, vomiting, back pain, vaginal discharge.  Patient has used AZO for symptoms.       ROS: Per HPI  Allergies  Allergen Reactions   Tape Rash   Past Medical History:  Diagnosis Date   DDD (degenerative disc disease), lumbar    Diabetes mellitus without complication (HCC)    GERD (gastroesophageal reflux disease)    Hyperlipidemia    Hypertension    Urge incontinence of urine     Current Outpatient Medications:    Alpha-Lipoic Acid 600 MG CAPS, Take 1 capsule (600 mg total) by mouth daily. For diabetic neuropathy, Disp: 90 capsule, Rfl: 3   aspirin EC 81 MG tablet, Take 81 mg by mouth in the morning and at bedtime. Swallow whole., Disp: , Rfl:    atorvastatin (LIPITOR) 40 MG tablet, TAKE 1 TABLET BY MOUTH EVERYDAY AT BEDTIME, Disp: 90 tablet, Rfl: 1   Blood Glucose Monitoring Suppl (ONE TOUCH ULTRA 2) w/Device KIT, 1 KIT BY DOES NOT APPLY ROUTE ONCE FOR 1 DOSE., Disp: , Rfl: 0   gabapentin (NEURONTIN) 300 MG capsule, TAKE 1 CAPSULE BY MOUTH THREE TIMES A DAY, Disp: 270 capsule, Rfl: 3   linagliptin (TRADJENTA) 5 MG TABS tablet, Take 1 tablet (5 mg total) by mouth daily., Disp: 28 tablet, Rfl: 0   lisinopril (ZESTRIL) 10 MG tablet, TAKE 1 TABLET (10 MG TOTAL) BY MOUTH IN THE MORNING AND AT BEDTIME. (STOP TAKING LISINOPRIL 20MG), Disp: 180 tablet, Rfl: 1   metFORMIN (GLUCOPHAGE-XR) 500 MG 24 hr tablet, TAKE 2 TABLETS BY MOUTH EVERY DAY WITH BREAKFAST, Disp: 180 tablet, Rfl: 1   metoprolol tartrate (LOPRESSOR) 25 MG tablet, Take 25 mg by mouth 2 (two) times daily., Disp: , Rfl:    OneTouch Delica Lancets 81O MISC, Check BS BID Dx E11.40, Disp: 200 each, Rfl: 3   ONETOUCH  ULTRA test strip, CHECK BLOOD SUGAR TWICE DAILY DX 11.40, Disp: 200 strip, Rfl: 3   TURMERIC PO, Take 1 capsule by mouth 2 (two) times daily. , Disp: , Rfl:    zolpidem (AMBIEN) 5 MG tablet, take 1 tablet(5 mg total) by mouth at bedtime as needed for sleep, Disp: 30 tablet, Rfl: 5 Social History   Socioeconomic History   Marital status: Married    Spouse name: Jimmy   Number of children: 2   Years of education: Not on file   Highest education level: Not on file  Occupational History   Occupation: retired  Tobacco Use   Smoking status: Never   Smokeless tobacco: Never  Vaping Use   Vaping Use: Never used  Substance and Sexual Activity   Alcohol use: No   Drug use: No   Sexual activity: Not on file  Other Topics Concern   Not on file  Social History Narrative   2 children  - son lives in [place], daughter 15 minutes away.   She and her husband work out at [employer] 3-4 x per week.   Social Determinants of Health   Financial Resource Strain: Low Risk    Difficulty of Paying Living Expenses: Not hard at all  Food Insecurity: No Food Insecurity   Worried About Charity fundraiser in the  Last Year: Never true   Ran Out of Food in the Last Year: Never true  Transportation Needs: No Transportation Needs   Lack of Transportation (Medical): No   Lack of Transportation (Non-Medical): No  Physical Activity: Insufficiently Active   Days of Exercise per Week: 3 days   Minutes of Exercise per Session: 30 min  Stress: No Stress Concern Present   Feeling of Stress : Not at all  Social Connections: Moderately Isolated   Frequency of Communication with Friends and Family: More than three times a week   Frequency of Social Gatherings with Friends and Family: More than three times a week   Attends Religious Services: Never   Marine scientist or Organizations: No   Attends Music therapist: Never   Marital Status: Married  Human resources officer Violence: Not At Risk    Fear of Current or Ex-Partner: No   Emotionally Abused: No   Physically Abused: No   Sexually Abused: No   Family History  Problem Relation Age of Onset   Cancer Mother        lung cancer   Cancer Father        lymphnodes   Diabetes Sister    Diabetes Brother    Diabetes Son    Cancer Maternal Aunt        throat   Cancer Paternal Uncle        unknown    Cancer Maternal Grandfather    Heart disease Paternal Grandfather    Cancer Other        breast   Colon cancer Neg Hx     Objective: Office vital signs reviewed. BP 138/81   Pulse 82   Temp (!) 97.1 F (36.2 C)   Ht 5' 5"  (1.651 m)   Wt 159 lb (72.1 kg)   SpO2 95%   BMI 26.46 kg/m   Physical Examination:  General: Awake, alert, well nourished, No acute distress GU: Suprapubic tenderness to palpation.  No CVA tenderness palpation  Assessment/ Plan: 70 y.o. female   Acute cystitis without hematuria - Plan: Urinalysis, Routine w reflex microscopic, Urine Culture, cephALEXin (KEFLEX) 500 MG capsule  Urinalysis consistent with UTI.  Keflex 500 mg twice daily sent.  Culture sent.  Fluids encouraged.  Red flag signs and symptoms discussed.  She will follow-up as needed  Orders Placed This Encounter  Procedures   Urinalysis, Routine w reflex microscopic   No orders of the defined types were placed in this encounter.    Janora Norlander, DO [place] [phone]

## 2020-09-24 NOTE — Telephone Encounter (Signed)
Pt called wanting to know if it is ok for her to take AZO for UTI with the medications she is on. Pt says she believes she has a UTI because she has chills when she uses the bathroom.  Please advise and call patient. Leave message if pt cant answer phone.

## 2020-09-24 NOTE — Telephone Encounter (Signed)
PT needs appt azo will not treat uti will only help symptoms can be televisit left detailed message

## 2020-09-24 NOTE — Patient Instructions (Signed)

## 2020-09-27 LAB — URINE CULTURE

## 2020-10-02 ENCOUNTER — Ambulatory Visit (INDEPENDENT_AMBULATORY_CARE_PROVIDER_SITE_OTHER): Payer: Medicare HMO | Admitting: Pharmacist

## 2020-10-02 DIAGNOSIS — E1140 Type 2 diabetes mellitus with diabetic neuropathy, unspecified: Secondary | ICD-10-CM

## 2020-10-02 NOTE — Progress Notes (Signed)
    10/02/2020 Name: Candace Lopez MRN: [id number] DOB: 1950/05/10   S:  35 yoF Presents for diabetes evaluation, education, and management Patient was referred and last seen by Primary Care Provider on 09/24/20.  Insurance coverage/medication affordability: aetna medicare  Patient reports adherence with medications. Current diabetes medications include: METFORMIN, TRADJENTA Current hypertension medications include: LISINOPRIL Goal 130/80 Current hyperlipidemia medications include: ATORVASTATIN  Patient denies hypoglycemic events.   Meal planning options and Plate method for healthy eating Avoid sugary drinks and desserts Incorporate balanced protein, non starchy veggies, 1 serving of carbohydrate with each meal Increase water intake Increase physical activity as able  Patient-reported exercise habits: N/A  Patient reports neuropathy (nerve pain).  Patient denies visual changes.  Patient reports self foot exams.    O:  Lab Results  Component Value Date   HGBA1C 7.9 (H) 08/28/2020     Lipid Panel     Component Value Date/Time   CHOL 109 04/05/2018 0842   TRIG 127 04/05/2018 0842   HDL 33 (L) 04/05/2018 0842   CHOLHDL 3.3 04/05/2018 0842   LDLCALC 51 04/05/2018 0842   LDLDIRECT 67 12/13/2019 1429     Home fasting blood sugars: <180  2 hour post-meal/random blood sugars: <220.    Clinical Atherosclerotic Cardiovascular Disease (ASCVD): No   The ASCVD Risk score Denman George DC Jr., et al., 2013) failed to calculate for the following reasons:   The valid total cholesterol range is 130 to 320 mg/dL    A/P:  Diabetes W8GQ currently uncontrolled. Patient is adherent with medication. Control is suboptimal due to current regimen/lifestyle.  -Continue metformin  -Doing well on tradjenta (DPP4) samples but over income limit for patient assistance  Will attempt to get onglyza (DPP4) covered via az&me  -Having trouble with gabapentin cost--will explore--appears to be  a tier 3 on aetna medicare list?  -Extensively discussed pathophysiology of diabetes, recommended lifestyle interventions, dietary effects on blood sugar control  -Counseled on s/sx of and management of hypoglycemia  Written patient instructions provided.  Total time in face to face counseling 30 minutes.   Follow up Pharmacist Clinic Visit ON 10/12/20.   [name], PharmD, BCPS Clinical Pharmacist, [facility]  II Phone [phone]

## 2020-10-03 NOTE — Telephone Encounter (Signed)
No call back will close encounter 

## 2020-10-09 ENCOUNTER — Other Ambulatory Visit: Payer: Self-pay

## 2020-10-09 ENCOUNTER — Ambulatory Visit (HOSPITAL_COMMUNITY)
Admission: RE | Admit: 2020-10-09 | Discharge: 2020-10-09 | Disposition: A | Discharge status: Home / Self Care | Payer: Medicare HMO | Source: Ambulatory Visit | Attending: Family Medicine | Admitting: Family Medicine

## 2020-10-09 DIAGNOSIS — N63 Unspecified lump in unspecified breast: Secondary | ICD-10-CM

## 2020-10-09 DIAGNOSIS — R922 Inconclusive mammogram: Secondary | ICD-10-CM | POA: Diagnosis not present

## 2020-10-09 DIAGNOSIS — N6314 Unspecified lump in the right breast, lower inner quadrant: Secondary | ICD-10-CM | POA: Insufficient documentation

## 2020-10-12 ENCOUNTER — Ambulatory Visit (INDEPENDENT_AMBULATORY_CARE_PROVIDER_SITE_OTHER): Payer: Medicare HMO | Admitting: Pharmacist

## 2020-10-12 DIAGNOSIS — E1140 Type 2 diabetes mellitus with diabetic neuropathy, unspecified: Secondary | ICD-10-CM

## 2020-10-16 NOTE — Progress Notes (Signed)
Chronic Care Management Pharmacy Note  10/12/2020 Name:  Candace Lopez MRN:  [id number] DOB:  09/06/1950  Summary: T2DM  Recommendations/Changes made from today's visit:  Diabetes: Uncontrolled--A1C 7.9%; GFR 68 Current treatment: Continue metformin XR 1G BID Doing well on tradjenta 5MG DAILY (DPP4) samples but over income limit for patient assistance  Will attempt to get onglyza (DPP4) covered via az&me PATIENT ASSISTANCE--USUALLY EASIER APPROVAL PROCESS Current glucose readings: fasting glucose: <150--IMPROVED WITH ADDITION OF DPP4, post prandial glucose: N/A Denies hypoglycemic/hyperglycemic symptoms Discussed meal planning options and Plate method for healthy eating Avoid sugary drinks and desserts Incorporate balanced protein, non starchy veggies, 1 serving of carbohydrate with each meal Increase water intake Increase physical activity as able Current exercise: N/A Recommended ONGYLZA--EASIEST PATIENT ASSISTANCE PROGRAM FOR DPP4; WOULD CONSIDER GLP1 VS SGLT2--HOWEVER PATIENT HAS STARTED ON SAMPLES OF TRADJENTA AND WOULD LIKE TO CONTINUE; WILL LOOK TO RE-ADDRESS THERAPY DURING RE-ENROLLMENT Collaborated with PCP REGARDING THERAPY AND PATIENT ASSISTANCE  Assessed patient finances. WILL SUBMIT APPLICATION FOR ONGYLZA TO [employer] PATIENT ASSISTANCE PROGRAM; 30 DAY SAMPLES OF ONGLYZA WERE GIVEN TO PATIENT TODAY  Plan: 1 MONTH  Subjective: Candace Lopez is an 70 y.o. year old female who is a primary patient of Janora Norlander, DO.  The CCM team was consulted for assistance with disease management and care coordination needs.    Engaged with patient by telephone for initial visit in response to provider referral for pharmacy case management and/or care coordination services.   Consent to Services:  The patient was given the following information about Chronic Care Management services today, agreed to services, and gave verbal consent: 1. CCM service includes personalized  support from designated clinical staff supervised by the primary care provider, including individualized plan of care and coordination with other care providers 2. 24/7 contact phone numbers for assistance for urgent and routine care needs. 3. Service will only be billed when office clinical staff spend 20 minutes or more in a month to coordinate care. 4. Only one practitioner may furnish and bill the service in a calendar month. 5.The patient may stop CCM services at any time (effective at the end of the month) by phone call to the office staff. 6. The patient will be responsible for cost sharing (co-pay) of up to 20% of the service fee (after annual deductible is met). Patient agreed to services and consent obtained.  Patient Care Team: Janora Norlander, DO as PCP - General (Family Medicine) Harlen Labs, MD as Referring Physician (Optometry)  Objective:  Lab Results  Component Value Date   CREATININE 0.92 08/28/2020   CREATININE 0.97 12/13/2019   CREATININE 0.95 10/07/2018    Lab Results  Component Value Date   HGBA1C 7.9 (H) 08/28/2020   Last diabetic Eye exam:  Lab Results  Component Value Date/Time   HMDIABEYEEXA No Retinopathy 10/28/2018 12:00 AM    Last diabetic Foot exam: No results found for: HMDIABFOOTEX      Component Value Date/Time   CHOL 109 04/05/2018 0842   TRIG 127 04/05/2018 0842   HDL 33 (L) 04/05/2018 0842   CHOLHDL 3.3 04/05/2018 0842   LDLCALC 51 04/05/2018 0842   LDLDIRECT 67 12/13/2019 1429    Hepatic Function Latest Ref Rng & Units 08/28/2020 12/13/2019 10/07/2018  Total Protein 6.0 - 8.5 g/dL 6.6 6.8 6.7  Albumin 3.8 - 4.8 g/dL 4.8 4.5 4.4  AST 0 - 40 IU/L _0 ALT 0 - 32 IU/L 17 13  13  Alk Phosphatase 44 - 121 IU/L 69 55 53  Total Bilirubin 0.0 - 1.2 mg/dL 1.5(H) 1.0 1.6(H)    Lab Results  Component Value Date/Time   TSH 2.060 05/27/2017 12:08 PM    CBC Latest Ref Rng & Units 05/27/2017 12/29/2016 03/14/2016  WBC 3.4 - 10.8 x10E3/uL  5.7 6.9 5.7  Hemoglobin 11.1 - 15.9 g/dL 13.6 15.5 14.9  Hematocrit 34.0 - 46.6 % 39.7 44.3 42.4  Platelets 150 - 379 x10E3/uL 254 264 241    Lab Results  Component Value Date/Time   VD25OH 27.5 (L) 12/13/2019 02:29 PM    Clinical ASCVD: No  The ASCVD Risk score (Arnett DK, et al., 2019) failed to calculate for the following reasons:   The valid total cholesterol range is 130 to 320 mg/dL    Other: (CHADS2VASc if Afib, PHQ9 if depression, MMRC or CAT for COPD, ACT, DEXA)  Social History   Tobacco Use  Smoking Status Never  Smokeless Tobacco Never   BP Readings from Last 3 Encounters:  09/24/20 138/81  08/28/20 123/73  02/27/20 (!) 148/73   Pulse Readings from Last 3 Encounters:  09/24/20 82  08/28/20 70  02/27/20 91   Wt Readings from Last 3 Encounters:  09/24/20 159 lb (72.1 kg)  09/06/20 159 lb (72.1 kg)  08/28/20 159 lb 3.2 oz (72.2 kg)    Assessment: Review of patient past medical history, allergies, medications, health status, including review of consultants reports, laboratory and other test data, was performed as part of comprehensive evaluation and provision of chronic care management services.   SDOH:  (Social Determinants of Health) assessments and interventions performed:    CCM Care Plan  Allergies  Allergen Reactions   Tape Rash    Medications Reviewed Today     Reviewed by Janora Norlander, DO (Physician) on 09/24/20 at 1516  Med List Status: <None>   Medication Order Taking? Sig Documenting Provider Last Dose Status Informant  Alpha-Lipoic Acid 600 MG CAPS [id number] No Take 1 capsule (600 mg total) by mouth daily. For diabetic neuropathy Janora Norlander, DO Taking Active   aspirin EC 81 MG tablet [id number] No Take 81 mg by mouth in the morning and at bedtime. Swallow whole. [provider] Taking Active   atorvastatin (LIPITOR) 40 MG tablet [id number]  TAKE 1 TABLET BY MOUTH EVERYDAY AT BEDTIME Ronnie Doss M, DO  Active    Blood Glucose Monitoring Suppl (ONE TOUCH ULTRA 2) w/Device KIT [id number] No 1 KIT BY DOES NOT APPLY ROUTE ONCE FOR 1 DOSE. [provider] Taking Active Self  cephALEXin (KEFLEX) 500 MG capsule [id number] Yes Take 1 capsule (500 mg total) by mouth 2 (two) times daily for 7 days. Ronnie Doss M, DO  Active   gabapentin (NEURONTIN) 300 MG capsule [id number] No TAKE 1 CAPSULE BY MOUTH THREE TIMES A DAY Gottschalk, Ashly M, DO Taking Active   linagliptin (TRADJENTA) 5 MG TABS tablet [id number] No Take 1 tablet (5 mg total) by mouth daily. Ronnie Doss M, DO Taking Active   lisinopril (ZESTRIL) 10 MG tablet [id number]  TAKE 1 TABLET (10 MG TOTAL) BY MOUTH IN THE MORNING AND AT BEDTIME. (STOP TAKING LISINOPRIL 20MG) Gottschalk, Ashly M, DO  Active   metFORMIN (GLUCOPHAGE-XR) 500 MG 24 hr tablet [id number] No TAKE 2 TABLETS BY MOUTH EVERY DAY WITH BREAKFAST Hawks, Christy A, FNP Taking Active   metoprolol tartrate (LOPRESSOR) 25 MG tablet [id number] No Take 25 mg by mouth 2 (two) times daily.  [provider] Taking Active   OneTouch Delica Lancets 00B MISC [id number] No Check BS BID Dx E11.40 Janora Norlander, DO Taking Active   [facility] ULTRA test strip [id number] No CHECK BLOOD SUGAR TWICE DAILY DX 11.40 Janora Norlander, DO Taking Active   TURMERIC PO [id number] No Take 1 capsule by mouth 2 (two) times daily.  [provider] Taking Active Self  zolpidem (AMBIEN) 5 MG tablet [id number] No take 1 tablet(5 mg total) by mouth at bedtime as needed for sleep Janora Norlander, DO Taking Active             Patient Active Problem List   Diagnosis Date Noted   Mild obesity 09/06/2020   Special screening for malignant neoplasms, colon 02/03/2018   Degenerative disk disease 06/30/2017   Mild obstructive sleep apnea 06/30/2017   Type 2 diabetes mellitus with diabetic neuropathy, unspecified ([place]) 03/14/2016   Hypertension associated with diabetes ([place]) 03/14/2016    Hyperlipidemia associated with type 2 diabetes mellitus ([place]) 03/14/2016   Restless leg syndrome, familial, uncontrolled 03/14/2016   Chest pain 05/06/2012   Health care maintenance 05/04/2012   Urge incontinence 06/28/2011   Left knee pain 01/23/2010    Immunization History  Administered Date(s) Administered   Fluad Quad(high Dose 65+) 10/07/2018, 10/31/2019   Influenza, High Dose Seasonal PF 12/29/2016, 10/21/2017   Moderna Sars-Covid-2 Vaccination 04/26/2019, 05/24/2019, 01/24/2020   Pneumococcal Conjugate-13 08/26/2017   Pneumococcal Polysaccharide-23 12/13/2019   Tdap 07/16/2012    Conditions to be addressed/monitored: DMII  Care Plan : PHARMD MEDICATION MANAGEMENT  Updates made by Lavera Guise, [place] since 10/16/2020 12:00 AM     Problem: DISEASE PROGRESSION PREVENTION      Long-Range Goal: T2DM   This Visit's Progress: Not on track  Priority: High  Note:   Current Barriers:  Unable to independently afford treatment regimen Unable to achieve control of T2DM  Suboptimal therapeutic regimen for T2DM  Pharmacist Clinical Goal(s):  Over the next 180 days, patient will verbalize ability to afford treatment regimen achieve control of T2DM as evidenced by IMPROVED GLYCEMIC CONTROL/GOAL A1C  through collaboration with PharmD and provider.    Interventions: 1:1 collaboration with Janora Norlander, DO regarding development and update of comprehensive plan of care as evidenced by provider attestation and co-signature Inter-disciplinary care team collaboration (see longitudinal plan of care) Comprehensive medication review performed; medication list updated in electronic medical record  Diabetes: Uncontrolled--A1C 7.9%; GFR 68 Current treatment: Continue metformin XR 1G BID Doing well on tradjenta 5MG DAILY (DPP4) samples but over income limit for patient assistance  Will attempt to get onglyza (DPP4) covered via az&me PATIENT ASSISTANCE--USUALLY EASIER APPROVAL  PROCESS Current glucose readings: fasting glucose: <150--IMPROVED WITH ADDITION OF DPP4, post prandial glucose: N/A Denies hypoglycemic/hyperglycemic symptoms Discussed meal planning options and Plate method for healthy eating Avoid sugary drinks and desserts Incorporate balanced protein, non starchy veggies, 1 serving of carbohydrate with each meal Increase water intake Increase physical activity as able Current exercise: N/A Recommended ONGYLZA--EASIEST PATIENT ASSISTANCE PROGRAM FOR DPP4; WOULD CONSIDER GLP1 VS SGLT2--HOWEVER PATIENT HAS STARTED ON SAMPLES OF TRADJENTA AND WOULD LIKE TO CONTINUE; WILL LOOK TO RE-ADDRESS THERAPY DURING RE-ENROLLMENT Collaborated with PCP REGARDING THERAPY AND PATIENT ASSISTANCE  Assessed patient finances. WILL SUBMIT APPLICATION FOR ONGYLZA TO [employer] PATIENT ASSISTANCE PROGRAM ; 30 DAY SAMPLES OF ONGLYZA WERE GIVEN TO PATIENT TODAY   Patient Goals/Self-Care Activities Over the next 90 days, patient will:  - take medications as prescribed check glucose 3-4  TIMES WEEKLY OR IF SYMPTOMATIC, document, and provide at future appointments collaborate with provider on medication access solutions  Follow Up Plan: Telephone follow up appointment with care management team member scheduled for: 1 MONTH      Medication Assistance: Application for [place]/ONGLYZA  medication assistance program. in process.  Anticipated assistance start date TBD.  See plan of care for additional detail.  Patient's preferred pharmacy is:  [id number] [place]Phone: [phone]: [phone] Uses pill box? No - /A Pt endorses 100% compliance  Follow Up:  Patient agrees to Care Plan and Follow-up.  Plan: Telephone follow up appointment with care management team member scheduled for:  1 MONTH  JRegina Eck PharmD, BCPS Clinical Pharmacist, [place] Phone [phone]

## 2020-10-16 NOTE — Patient Instructions (Signed)
Visit Information  PATIENT GOALS:  Goals Addressed               This Visit's Progress     Patient Stated     T2DM PHARMD GOAL (pt-stated)        Current Barriers:  Unable to independently afford treatment regimen Unable to achieve control of T2DM  Suboptimal therapeutic regimen for T2DM  Pharmacist Clinical Goal(s):  Over the next 180 days, patient will verbalize ability to afford treatment regimen achieve control of T2DM as evidenced by IMPROVED GLYCEMIC CONTROL/GOAL A1C through collaboration with PharmD and provider.    Interventions: 1:1 collaboration with [name], DO regarding development and update of comprehensive plan of care as evidenced by provider attestation and co-signature Inter-disciplinary care team collaboration (see longitudinal plan of care) Comprehensive medication review performed; medication list updated in electronic medical record  Diabetes: Uncontrolled--A1C 7.9%; GFR 68 Current treatment: Continue metformin XR 1G BID Doing well on tradjenta 5MG  DAILY (DPP4) samples but over income limit for patient assistance  Will attempt to get onglyza (DPP4) covered via az&me PATIENT ASSISTANCE--USUALLY EASIER APPROVAL PROCESS Current glucose readings: fasting glucose: <150--IMPROVED WITH ADDITION OF DPP4, post prandial glucose: N/A Denies hypoglycemic/hyperglycemic symptoms Discussed meal planning options and Plate method for healthy eating Avoid sugary drinks and desserts Incorporate balanced protein, non starchy veggies, 1 serving of carbohydrate with each meal Increase water intake Increase physical activity as able Current exercise: N/A Recommended ONGYLZA--EASIEST PATIENT ASSISTANCE PROGRAM FOR DPP4; WOULD CONSIDER GLP1 VS SGLT2--HOWEVER PATIENT HAS STARTED ON SAMPLES OF TRADJENTA AND WOULD LIKE TO CONTINUE; WILL LOOK TO RE-ADDRESS THERAPY DURING RE-ENROLLMENT Collaborated with PCP REGARDING THERAPY AND PATIENT ASSISTANCE  Assessed patient  finances. WILL SUBMIT APPLICATION FOR ONGYLZA TO [employer] PATIENT ASSISTANCE PROGRAM; 30 DAY SAMPLES OF ONGLYZA WERE GIVEN TO PATIENT TODAY   Patient Goals/Self-Care Activities Over the next 90 days, patient will:  - take medications as prescribed check glucose 3-4 TIMES WEEKLY OR IF SYMPTOMATIC, document, and provide at future appointments collaborate with provider on medication access solutions  Follow Up Plan: Telephone follow up appointment with care management team member scheduled for: 1 MONTH         The patient verbalized understanding of instructions, educational materials, and care plan provided today and declined offer to receive copy of patient instructions, educational materials, and care plan.   Telephone follow up appointment with care management team member scheduled for: 1 MONTH  , PharmD, BCPS Clinical Pharmacist, [facility]  [facility] Phone [phone]

## 2020-10-25 ENCOUNTER — Telehealth: Payer: Self-pay | Admitting: Family Medicine

## 2020-10-25 NOTE — Telephone Encounter (Signed)
Spoke with patient, she tested once on Sunday but has not tested again.  She is not having any symptoms at this time.  I advised her she can test again at any time.  Also advised she should wait on the flu shot and she rescheduled to 11/05/20.

## 2020-10-26 DIAGNOSIS — E1140 Type 2 diabetes mellitus with diabetic neuropathy, unspecified: Secondary | ICD-10-CM | POA: Diagnosis not present

## 2020-10-29 ENCOUNTER — Ambulatory Visit: Payer: Medicare HMO

## 2020-10-31 NOTE — Progress Notes (Signed)
Received notification from [employer] regarding approval for ONGLYZA 5MG . Patient assistance approved from 10/21/20 to 01/26/21.  MEDICATION SHIPS TO PT'S HOME (REC'D 90 DAY SUPPLY 10/30/20)  Phone: [phone]

## 2020-11-05 ENCOUNTER — Ambulatory Visit (INDEPENDENT_AMBULATORY_CARE_PROVIDER_SITE_OTHER): Payer: Medicare HMO

## 2020-11-05 ENCOUNTER — Other Ambulatory Visit: Payer: Self-pay

## 2020-11-05 DIAGNOSIS — Z23 Encounter for immunization: Secondary | ICD-10-CM

## 2020-11-14 ENCOUNTER — Ambulatory Visit (INDEPENDENT_AMBULATORY_CARE_PROVIDER_SITE_OTHER): Payer: Medicare HMO | Admitting: Pharmacist

## 2020-11-14 DIAGNOSIS — E1140 Type 2 diabetes mellitus with diabetic neuropathy, unspecified: Secondary | ICD-10-CM

## 2020-11-14 MED ORDER — SAXAGLIPTIN HCL 5 MG PO TABS: 5.0000 mg | ORAL_TABLET | Freq: Every day | ORAL | 3 refills | Status: DC

## 2020-11-14 NOTE — Progress Notes (Signed)
Chronic Care Management Pharmacy Note  11/14/2020 Name:  Candace Lopez MRN:  [id number] DOB:  [birth year]  Summary: t2dm  Recommendations/Changes made from today's visit: Diabetes: Uncontrolled--A1C 7.9%; GFR 68 Current treatment: Continue metformin XR 1G BID Onglyza 3m daily (DPP4) patient has started and is tolerating well (med list update to reflect since previously was on tradjenta samples) Onglyza is covered via az&me PCrowdersent to PCP for cosign--will go to [facility] mail order (pharmacy for AZ&me patient assistance) Current glucose readings: fasting glucose: <150--IMPROVED WITH ADDITION OF DPP4, post prandial glucose: N/A Denies hypoglycemic/hyperglycemic symptoms Discussed meal planning options and Plate method for healthy eating Avoid sugary drinks and desserts Incorporate balanced protein, non starchy veggies, 1 serving of carbohydrate with each meal Increase water intake Increase physical activity as able Current exercise: N/A Recommended ONGYLZA--EASIEST PATIENT ASSISTANCE PROGRAM FOR DPP4; WOULD CONSIDER GLP1 VS SGLT2--HOWEVER PATIENT HAS STARTED ON SAMPLES OF TRADJENTA AND WOULD LIKE TO CONTINUE; WILL LOOK TO RE-ADDRESS THERAPY DURING RE-ENROLLMENT Collaborated with PCP REGARDING THERAPY AND PATIENT ASSISTANCE  Assessed patient finances. Approved for -->ONGYLZA TO AZ&ME PATIENT ASSISTANCE PROGRAM  Follow Up Plan: Telephone follow up appointment with care management team member scheduled for: 1 MONTH  Subjective: Candace NIHISERis an 70y.o. year old female who is a primary patient of GJanora Norlander DO.  The CCM team was consulted for assistance with disease management and care coordination needs.    Engaged with patient by telephone for follow up visit in response to provider referral for pharmacy case management and/or care coordination services.   Consent to Services:  The patient was given information about Chronic Care  Management services, agreed to services, and gave verbal consent prior to initiation of services.  Please see initial visit note for detailed documentation.   Patient Care Team: GJanora Norlander DO as PCP - General (Family Medicine) LHarlen Labs MD as Referring Physician (Optometry) PLavera Guise [facility] Pharmacist (Family Medicine)  Objective:  Lab Results  Component Value Date   CREATININE 0.92 08/28/2020   CREATININE 0.97 12/13/2019   CREATININE 0.95 10/07/2018    Lab Results  Component Value Date   HGBA1C 7.9 (H) 08/28/2020   Last diabetic Eye exam:  Lab Results  Component Value Date/Time   HMDIABEYEEXA No Retinopathy 10/28/2018 12:00 AM    Last diabetic Foot exam: No results found for: HMDIABFOOTEX      Component Value Date/Time   CHOL 109 04/05/2018 0842   TRIG 127 04/05/2018 0842   HDL 33 (L) 04/05/2018 0842   CHOLHDL 3.3 04/05/2018 0842   LDLCALC 51 04/05/2018 0842   LDLDIRECT 67 12/13/2019 1429    Hepatic Function Latest Ref Rng & Units 08/28/2020 12/13/2019 10/07/2018  Total Protein 6.0 - 8.5 g/dL 6.6 6.8 6.7  Albumin 3.8 - 4.8 g/dL 4.8 4.5 4.4  AST 0 - 40 IU/L 16 15 13   ALT 0 - 32 IU/L 17 13 13   Alk Phosphatase 44 - 121 IU/L 69 55 53  Total Bilirubin 0.0 - 1.2 mg/dL 1.5(H) 1.0 1.6(H)    Lab Results  Component Value Date/Time   TSH 2.060 05/27/2017 12:08 PM    CBC Latest Ref Rng & Units 05/27/2017 12/29/2016 03/14/2016  WBC 3.4 - 10.8 x10E3/uL 5.7 6.9 5.7  Hemoglobin 11.1 - 15.9 g/dL 13.6 15.5 14.9  Hematocrit 34.0 - 46.6 % 39.7 44.3 42.4  Platelets 150 - 379 x10E3/uL 254 264 241    Lab Results  Component Value Date/Time   VD25OH 27.5 (L) 12/13/2019 02:29 PM    Clinical ASCVD: No  The ASCVD Risk score (Arnett DK, et al., 2019) failed to calculate for the following reasons:   The valid total cholesterol range is 130 to 320 mg/dL    Other: (CHADS2VASc if Afib, PHQ9 if depression, MMRC or CAT for COPD, ACT, DEXA)  Social History    Tobacco Use  Smoking Status Never  Smokeless Tobacco Never   BP Readings from Last 3 Encounters:  09/24/20 138/81  08/28/20 123/73  02/27/20 (!) 148/73   Pulse Readings from Last 3 Encounters:  09/24/20 82  08/28/20 70  02/27/20 91   Wt Readings from Last 3 Encounters:  09/24/20 159 lb (72.1 kg)  09/06/20 159 lb (72.1 kg)  08/28/20 159 lb 3.2 oz (72.2 kg)    Assessment: Review of patient past medical history, allergies, medications, health status, including review of consultants reports, laboratory and other test data, was performed as part of comprehensive evaluation and provision of chronic care management services.   SDOH:  (Social Determinants of Health) assessments and interventions performed:    CCM Care Plan  Allergies  Allergen Reactions   Tape Rash    Medications Reviewed Today     Reviewed by Lavera Guise, [facility] (Pharmacist) on 11/14/20 at 1008  Med List Status: <None>   Medication Order Taking? Sig Documenting Provider Last Dose Status Informant  Alpha-Lipoic Acid 600 MG CAPS [id number] No Take 1 capsule (600 mg total) by mouth daily. For diabetic neuropathy Janora Norlander, DO Taking Active   aspirin EC 81 MG tablet [id number] No Take 81 mg by mouth in the morning and at bedtime. Swallow whole. [provider] Taking Active   atorvastatin (LIPITOR) 40 MG tablet [id number]  TAKE 1 TABLET BY MOUTH EVERYDAY AT BEDTIME Ronnie Doss M, DO  Active   Blood Glucose Monitoring Suppl (ONE TOUCH ULTRA 2) w/Device KIT [id number] No 1 KIT BY DOES NOT APPLY ROUTE ONCE FOR 1 DOSE. [provider] Taking Active Self  gabapentin (NEURONTIN) 300 MG capsule [id number] No TAKE 1 CAPSULE BY MOUTH THREE TIMES A DAY Ronnie Doss M, DO Taking Active   lisinopril (ZESTRIL) 10 MG tablet [id number]  TAKE 1 TABLET (10 MG TOTAL) BY MOUTH IN THE MORNING AND AT BEDTIME. (STOP TAKING LISINOPRIL 20MG) Gottschalk, Ashly M, DO  Active   metFORMIN (GLUCOPHAGE-XR) 500  MG 24 hr tablet [id number] No TAKE 2 TABLETS BY MOUTH EVERY DAY WITH BREAKFAST Hawks, Christy A, FNP Taking Active   metoprolol tartrate (LOPRESSOR) 25 MG tablet [id number] No Take 25 mg by mouth 2 (two) times daily. [provider] Taking Active   OneTouch Delica Lancets 75I MISC [id number] No Check BS BID Dx E11.40 Janora Norlander, DO Taking Active   [facility] ULTRA test strip [id number] No CHECK BLOOD SUGAR TWICE DAILY DX 11.40 Ronnie Doss M, DO Taking Active   saxagliptin HCl (ONGLYZA) 5 MG TABS tablet [id number] Yes Take 1 tablet (5 mg total) by mouth daily. Janora Norlander, DO  Active            Med Note Lavera Guise   Wed Nov 14, 2020 10:06 AM) Faith Rogue AZ&me patient assistance program  TURMERIC PO [id number] No Take 1 capsule by mouth 2 (two) times daily.  [provider] Taking Active Self  zolpidem (AMBIEN) 5 MG tablet [id number] No take 1 tablet(5 mg total) by mouth at bedtime as needed for sleep Janora Norlander,  DO Taking Active             Patient Active Problem List   Diagnosis Date Noted   Mild obesity 09/06/2020   Special screening for malignant neoplasms, colon 02/03/2018   Degenerative disk disease 06/30/2017   Mild obstructive sleep apnea 06/30/2017   Type 2 diabetes mellitus with diabetic neuropathy, unspecified ([place]) 03/14/2016   Hypertension associated with diabetes ([place]) 03/14/2016   Hyperlipidemia associated with type 2 diabetes mellitus ([place]) 03/14/2016   Restless leg syndrome, familial, uncontrolled 03/14/2016   Chest pain 05/06/2012   Health care maintenance 05/04/2012   Urge incontinence 06/28/2011   Left knee pain 01/23/2010    Immunization History  Administered Date(s) Administered   Fluad Quad(high Dose 65+) 10/07/2018, 10/31/2019, 11/05/2020   Influenza, High Dose Seasonal PF 12/29/2016, 10/21/2017   Moderna Sars-Covid-2 Vaccination 04/26/2019, 05/24/2019, 01/24/2020   Pneumococcal Conjugate-13 08/26/2017   Pneumococcal  Polysaccharide-23 12/13/2019   Tdap 07/16/2012    Conditions to be addressed/monitored: DMII  Care Plan : PHARMD MEDICATION MANAGEMENT  Updates made by Lavera Guise, [place] since 11/14/2020 12:00 AM     Problem: DISEASE PROGRESSION PREVENTION      Long-Range Goal: T2DM   Recent Progress: Not on track  Priority: High  Note:   Current Barriers:  Unable to independently afford treatment regimen Unable to achieve control of T2DM  Suboptimal therapeutic regimen for T2DM  Pharmacist Clinical Goal(s):  Over the next 180 days, patient will verbalize ability to afford treatment regimen achieve control of T2DM as evidenced by IMPROVED GLYCEMIC CONTROL/GOAL A1C  through collaboration with PharmD and provider.   Interventions: 1:1 collaboration with Janora Norlander, DO regarding development and update of comprehensive plan of care as evidenced by provider attestation and co-signature Inter-disciplinary care team collaboration (see longitudinal plan of care) Comprehensive medication review performed; medication list updated in electronic medical record  Diabetes: Uncontrolled--A1C 7.9%; GFR 68 Current treatment: Continue metformin XR 1G BID Onglyza 13m daily (DPP4) patient has started and is tolerating well (med list update to reflect since previously was on tradjenta samples) Onglyza is [place]--will go to [facility] mail order (pharmacy for AZ&me patient assistance) Current glucose readings: fasting glucose: <150--IMPROVED WITH ADDITION OF DPP4, post prandial glucose: N/A Denies hypoglycemic/hyperglycemic symptoms Discussed meal planning options and Plate method for healthy eating Avoid sugary drinks and desserts Incorporate balanced protein, non starchy veggies, 1 serving of carbohydrate with each meal Increase water intake Increase physical activity as able Current exercise: N/A Recommended ONGYLZA--EASIEST  PATIENT ASSISTANCE PROGRAM FOR DPP4; WOULD CONSIDER GLP1 VS SGLT2--HOWEVER PATIENT HAS STARTED ON SAMPLES OF TRADJENTA AND WOULD LIKE TO CONTINUE; WILL LOOK TO RE-ADDRESS THERAPY DURING RE-ENROLLMENT Collaborated with PCP REGARDING THERAPY AND PATIENT ASSISTANCE  Assessed patient finances. Approved for -->ONGYLZA TO AZ&ME PATIENT ASSISTANCE PROGRAM  Patient Goals/Self-Care Activities Over the next 90 days, patient will:  - take medications as prescribed check glucose 3-4 TIMES WEEKLY OR IF SYMPTOMATIC, document, and provide at future appointments collaborate with provider on medication access solutions  Follow Up Plan: Telephone follow up appointment with care management team member scheduled for: 1 MONTH      Medication Assistance:  onglyza obtained through az&me medication assistance program.  Enrollment ends 01/26/21  [facility]  [id number] [address]: [phone]: [address] [address]  [place] [address] Phone: [phone] Fax: [phone]  Follow Up:  Patient agrees to Care Plan and Follow-up.  Plan: Telephone follow up appointment with care management team member scheduled for:  12/26/20  Regina Eck, PharmD, BCPS Clinical Pharmacist, [place]  II Phone [phone]

## 2020-11-14 NOTE — Patient Instructions (Signed)
Visit Information  PATIENT GOALS:  Goals Addressed               This Visit's Progress     Patient Stated     T2DM PHARMD GOAL (pt-stated)        Current Barriers:  Unable to independently afford treatment regimen Unable to achieve control of T2DM  Suboptimal therapeutic regimen for T2DM  Pharmacist Clinical Goal(s):  Over the next 180 days, patient will verbalize ability to afford treatment regimen achieve control of T2DM as evidenced by IMPROVED GLYCEMIC CONTROL/GOAL A1C through collaboration with PharmD and provider.   Interventions: 1:1 collaboration with [name], DO regarding development and update of comprehensive plan of care as evidenced by provider attestation and co-signature Inter-disciplinary care team collaboration (see longitudinal plan of care) Comprehensive medication review performed; medication list updated in electronic medical record  Diabetes: Uncontrolled--A1C 7.9%; GFR 68 Current treatment: Continue metformin XR 1G BID Onglyza 5mg  daily (DPP4) patient has started and is tolerating well (med list update to reflect since previously was on tradjenta samples) Onglyza is covered via az&me PATIENT ASSISTANCE PROGRAM Refills sent to PCP for cosign--will go to [facility] mail order (pharmacy for AZ&me patient assistance) Current glucose readings: fasting glucose: <150--IMPROVED WITH ADDITION OF DPP4, post prandial glucose: N/A Denies hypoglycemic/hyperglycemic symptoms Discussed meal planning options and Plate method for healthy eating Avoid sugary drinks and desserts Incorporate balanced protein, non starchy veggies, 1 serving of carbohydrate with each meal Increase water intake Increase physical activity as able Current exercise: N/A Recommended ONGYLZA--EASIEST PATIENT ASSISTANCE PROGRAM FOR DPP4; WOULD CONSIDER GLP1 VS SGLT2--HOWEVER PATIENT HAS STARTED ON SAMPLES OF TRADJENTA AND WOULD LIKE TO CONTINUE; WILL LOOK TO RE-ADDRESS THERAPY DURING  RE-ENROLLMENT Collaborated with PCP REGARDING THERAPY AND PATIENT ASSISTANCE  Assessed patient finances. Approved for -->ONGYLZA TO AZ&ME PATIENT ASSISTANCE PROGRAM  Patient Goals/Self-Care Activities Over the next 90 days, patient will:  - take medications as prescribed check glucose 3-4 TIMES WEEKLY OR IF SYMPTOMATIC, document, and provide at future appointments collaborate with provider on medication access solutions  Follow Up Plan: Telephone follow up appointment with care management team member scheduled for: 1 MONTH         The patient verbalized understanding of instructions, educational materials, and care plan provided today and declined offer to receive copy of patient instructions, educational materials, and care plan.   Telephone follow up appointment with care management team member scheduled for:  Signature , PharmD, BCPS Clinical Pharmacist, [facility]  II Phone [phone]

## 2020-11-17 ENCOUNTER — Other Ambulatory Visit: Payer: Self-pay | Admitting: Family

## 2020-11-17 ENCOUNTER — Other Ambulatory Visit: Payer: Self-pay | Admitting: Family Medicine

## 2020-11-17 DIAGNOSIS — E1140 Type 2 diabetes mellitus with diabetic neuropathy, unspecified: Secondary | ICD-10-CM

## 2020-11-19 NOTE — Telephone Encounter (Signed)
Can we verify that this is something she is still taking?

## 2020-11-26 DIAGNOSIS — E1140 Type 2 diabetes mellitus with diabetic neuropathy, unspecified: Secondary | ICD-10-CM | POA: Diagnosis not present

## 2020-11-27 ENCOUNTER — Ambulatory Visit (INDEPENDENT_AMBULATORY_CARE_PROVIDER_SITE_OTHER): Payer: Medicare HMO | Admitting: Family Medicine

## 2020-11-27 ENCOUNTER — Encounter: Payer: Self-pay | Admitting: Family Medicine

## 2020-11-27 ENCOUNTER — Other Ambulatory Visit: Payer: Self-pay

## 2020-11-27 VITALS — BP 146/80 | HR 72 | Temp 97.8°F | Ht 65.0 in | Wt 157.8 lb

## 2020-11-27 DIAGNOSIS — E1140 Type 2 diabetes mellitus with diabetic neuropathy, unspecified: Secondary | ICD-10-CM

## 2020-11-27 DIAGNOSIS — E1159 Type 2 diabetes mellitus with other circulatory complications: Secondary | ICD-10-CM | POA: Diagnosis not present

## 2020-11-27 DIAGNOSIS — E785 Hyperlipidemia, unspecified: Secondary | ICD-10-CM | POA: Diagnosis not present

## 2020-11-27 DIAGNOSIS — E1169 Type 2 diabetes mellitus with other specified complication: Secondary | ICD-10-CM | POA: Diagnosis not present

## 2020-11-27 DIAGNOSIS — G2581 Restless legs syndrome: Secondary | ICD-10-CM | POA: Diagnosis not present

## 2020-11-27 DIAGNOSIS — I152 Hypertension secondary to endocrine disorders: Secondary | ICD-10-CM

## 2020-11-27 LAB — LIPID PANEL
Chol/HDL Ratio: 4.6 ratio — ABNORMAL HIGH (ref 0.0–4.4)
Cholesterol, Total: 130 mg/dL (ref 100–199)
HDL: 28 mg/dL — ABNORMAL LOW (ref >39)
LDL Chol Calc (NIH): 71 mg/dL (ref 0–99)
Triglycerides: 184 mg/dL — ABNORMAL HIGH (ref 0–149)
VLDL Cholesterol Cal: 31 mg/dL (ref 5–40)

## 2020-11-27 LAB — BAYER DCA HB A1C WAIVED: HB A1C (BAYER DCA - WAIVED): 6.4 % — ABNORMAL HIGH (ref 4.8–5.6)

## 2020-11-27 MED ORDER — PRAMIPEXOLE DIHYDROCHLORIDE 0.125 MG PO TABS: ORAL_TABLET | ORAL | 0 refills | Status: DC

## 2020-11-27 NOTE — Patient Instructions (Signed)
Mirapex for restless leg syndrome.  We are starting with a really low dose.  You may increase to 2 tablets in 1 week if needed.

## 2020-11-27 NOTE — Progress Notes (Signed)
Subjective: CC: DM PCP: Janora Norlander, DO GEX:[id number] H Candace Lopez is a 70 y.o. female presenting to clinic today for:  1. Type 2 Diabetes with hypertension, hyperlipidemia:  Compliant with medications.  No chest pain, shortness breath, dizziness.  Last eye exam: UTD Last foot exam: UTD Last A1c:  Lab Results  Component Value Date   HGBA1C 7.9 (H) 08/28/2020   Nephropathy screen indicated?: on ACE-I Last flu, zoster and/or pneumovax:  Immunization History  Administered Date(s) Administered   Fluad Quad(high Dose 65+) 10/07/2018, 10/31/2019, 11/05/2020   Influenza, High Dose Seasonal PF 12/29/2016, 10/21/2017   Moderna Sars-Covid-2 Vaccination 04/26/2019, 05/24/2019, 01/24/2020   Pneumococcal Conjugate-13 08/26/2017   Pneumococcal Polysaccharide-23 12/13/2019   Tdap 07/16/2012   2.  Restless leg syndrome Patient reports that symptoms are under uncontrolled with the gabapentin.  She is had intolerance to Requip in the past.  This apparently runs in her family.  She notes excellent control of her neuropathy however with the alpha lipoic acid   ROS: Per HPI  Allergies  Allergen Reactions   Tape Rash   Past Medical History:  Diagnosis Date   DDD (degenerative disc disease), lumbar    Diabetes mellitus without complication (HCC)    GERD (gastroesophageal reflux disease)    Hyperlipidemia    Hypertension    Urge incontinence of urine     Current Outpatient Medications:    Alpha-Lipoic Acid 600 MG CAPS, Take 1 capsule (600 mg total) by mouth daily. For diabetic neuropathy, Disp: 90 capsule, Rfl: 3   aspirin EC 81 MG tablet, Take 81 mg by mouth in the morning and at bedtime. Swallow whole., Disp: , Rfl:    atorvastatin (LIPITOR) 40 MG tablet, TAKE 1 TABLET BY MOUTH EVERYDAY AT BEDTIME, Disp: 90 tablet, Rfl: 1   Blood Glucose Monitoring Suppl (ONE TOUCH ULTRA 2) w/Device KIT, 1 KIT BY DOES NOT APPLY ROUTE ONCE FOR 1 DOSE., Disp: , Rfl: 0   gabapentin (NEURONTIN) 300 MG  capsule, TAKE 1 CAPSULE BY MOUTH THREE TIMES A DAY, Disp: 270 capsule, Rfl: 3   lisinopril (ZESTRIL) 10 MG tablet, TAKE 1 TABLET (10 MG TOTAL) BY MOUTH IN THE MORNING AND AT BEDTIME. (STOP TAKING LISINOPRIL 20MG), Disp: 180 tablet, Rfl: 1   metFORMIN (GLUCOPHAGE-XR) 500 MG 24 hr tablet, TAKE 2 TABLETS BY MOUTH EVERY DAY WITH BREAKFAST, Disp: 180 tablet, Rfl: 0   metoprolol tartrate (LOPRESSOR) 25 MG tablet, Take 25 mg by mouth 2 (two) times daily., Disp: , Rfl:    OneTouch Delica Lancets 24M MISC, Check BS BID Dx E11.40, Disp: 200 each, Rfl: 3   ONETOUCH ULTRA test strip, CHECK BLOOD SUGAR TWICE DAILY DX 11.40, Disp: 200 strip, Rfl: 3   saxagliptin HCl (ONGLYZA) 5 MG TABS tablet, Take 1 tablet (5 mg total) by mouth daily., Disp: 90 tablet, Rfl: 3   TURMERIC PO, Take 1 capsule by mouth 2 (two) times daily. , Disp: , Rfl:    zolpidem (AMBIEN) 5 MG tablet, take 1 tablet(5 mg total) by mouth at bedtime as needed for sleep, Disp: 30 tablet, Rfl: 5 Social History   Socioeconomic History   Marital status: Married    Spouse name: Jimmy   Number of children: 2   Years of education: Not on file   Highest education level: Not on file  Occupational History   Occupation: retired  Tobacco Use   Smoking status: Never   Smokeless tobacco: Never  Vaping Use   Vaping Use: Never used  Substance and Sexual Activity   Alcohol use: No   Drug use: No   Sexual activity: Not on file  Other Topics Concern   Not on file  Social History Narrative   2 children  - son lives in [place], daughter 15 minutes away.   She and her husband work out at [employer] 3-4 x per week.   Social Determinants of Health   Financial Resource Strain: Low Risk    Difficulty of Paying Living Expenses: Not hard at all  Food Insecurity: No Food Insecurity   Worried About Charity fundraiser in the Last Year: Never true   [place] in the Last Year: Never true  Transportation Needs: No Transportation Needs   Lack  of Transportation (Medical): No   Lack of Transportation (Non-Medical): No  Physical Activity: Insufficiently Active   Days of Exercise per Week: 3 days   Minutes of Exercise per Session: 30 min  Stress: No Stress Concern Present   Feeling of Stress : Not at all  Social Connections: Moderately Isolated   Frequency of Communication with Friends and Family: More than three times a week   Frequency of Social Gatherings with Friends and Family: More than three times a week   Attends Religious Services: Never   Marine scientist or Organizations: No   Attends Music therapist: Never   Marital Status: Married  Human resources officer Violence: Not At Risk   Fear of Current or Ex-Partner: No   Emotionally Abused: No   Physically Abused: No   Sexually Abused: No   Family History  Problem Relation Age of Onset   Cancer Mother        lung cancer   Cancer Father        lymphnodes   Diabetes Sister    Diabetes Brother    Diabetes Son    Cancer Maternal Aunt        throat   Cancer Paternal Uncle        unknown    Cancer Maternal Grandfather    Heart disease Paternal Grandfather    Cancer Other        breast   Colon cancer Neg Hx     Objective: Office vital signs reviewed. BP (!) 146/80   Pulse 72   Temp 97.8 F (36.6 C)   Ht 5' 5"  (1.651 m)   Wt 157 lb 12.8 oz (71.6 kg)   SpO2 98%   BMI 26.26 kg/m   Physical Examination:  General: Awake, alert, well nourished, No acute distress HEENT: Normal; sclera white.  Moist mucous membranes Cardio: regular rate and rhythm, S1S2 heard, no murmurs appreciated Pulm: clear to auscultation bilaterally, no wheezes, rhonchi or rales; normal work of breathing on room air Extremities: warm, well perfused, No edema, cyanosis or clubbing; +2 pulses bilaterally Neuro: No tremoring noted  Diabetic Foot Exam - Simple   Simple Foot Form Diabetic Foot exam was performed with the following findings: Yes 11/27/2020 12:48 PM  Visual  Inspection No deformities, no ulcerations, no other skin breakdown bilaterally: Yes Sensation Testing Intact to touch and monofilament testing bilaterally: Yes Pulse Check Posterior Tibialis and Dorsalis pulse intact bilaterally: Yes Comments      Assessment/ Plan: 70 y.o. female   Type 2 diabetes mellitus with diabetic neuropathy, without long-term current use of insulin (HCC) - Plan: Lipid panel, Bayer DCA Hb A1c Waived  Hyperlipidemia associated with type 2 diabetes mellitus ([place])  Hypertension associated  with diabetes ([place])  Restless leg syndrome, familial, uncontrolled - Plan: pramipexole (MIRAPEX) 0.125 MG tablet  Sugar control.  No changes needed  Check lipid panel.  Continue statin  Blood pressure under good control for age.  No changes needed  Trial of Mirapex for restless legs.  She will contact me in 1 month let me know how she is doing on this medicine  Orders Placed This Encounter  Procedures   Lipid panel   Bayer DCA Hb A1c Waived   No orders of the defined types were placed in this encounter.    Janora Norlander, DO [place] [phone]

## 2020-12-01 ENCOUNTER — Other Ambulatory Visit: Payer: Self-pay | Admitting: Family Medicine

## 2020-12-01 DIAGNOSIS — G2581 Restless legs syndrome: Secondary | ICD-10-CM

## 2020-12-14 LAB — HM DIABETES EYE EXAM

## 2020-12-24 ENCOUNTER — Telehealth: Payer: Self-pay | Admitting: Family Medicine

## 2020-12-25 ENCOUNTER — Other Ambulatory Visit: Payer: Self-pay | Admitting: Family Medicine

## 2020-12-25 DIAGNOSIS — G2581 Restless legs syndrome: Secondary | ICD-10-CM

## 2020-12-25 MED ORDER — PRAMIPEXOLE DIHYDROCHLORIDE 0.125 MG PO TABS: ORAL_TABLET | ORAL | 3 refills | Status: DC

## 2020-12-25 NOTE — Telephone Encounter (Signed)
Thank you for update.  I've adjusted rx and sent x1 year to pharmacy

## 2020-12-26 ENCOUNTER — Telehealth: Payer: Medicare HMO

## 2021-01-25 ENCOUNTER — Ambulatory Visit (INDEPENDENT_AMBULATORY_CARE_PROVIDER_SITE_OTHER): Payer: Medicare HMO | Admitting: Pharmacist

## 2021-01-25 DIAGNOSIS — E1140 Type 2 diabetes mellitus with diabetic neuropathy, unspecified: Secondary | ICD-10-CM

## 2021-01-25 DIAGNOSIS — E785 Hyperlipidemia, unspecified: Secondary | ICD-10-CM

## 2021-01-25 NOTE — Patient Instructions (Addendum)
Visit Information  Thank you for taking time to visit with me today. Please don't hesitate to contact me if I can be of assistance to you before our next scheduled telephone appointment.  Following are the goals we discussed today:  Current Barriers:  Unable to independently afford treatment regimen Unable to achieve control of T2DM  Suboptimal therapeutic regimen for T2DM  Pharmacist Clinical Goal(s):  Over the next 180 days, patient will verbalize ability to afford treatment regimen achieve control of T2DM as evidenced by IMPROVED GLYCEMIC CONTROL/GOAL A1C through collaboration with PharmD and provider.   Interventions: 1:1 collaboration with [name], DO regarding development and update of comprehensive plan of care as evidenced by provider attestation and co-signature Inter-disciplinary care team collaboration (see longitudinal plan of care) Comprehensive medication review performed; medication list updated in electronic medical record  Diabetes: Uncontrolled--A1C 7.9%; GFR 68 Current treatment: Continue metformin XR 1G BID Onglyza 5mg  daily (DPP4) patient has started and is tolerating well  Onglyza is covered via az&me PATIENT ASSISTANCE PROGRAM Refills sent to PCP for cosign--will go to [facility] mail order (pharmacy for AZ&me patient assistance) Current glucose readings: fasting glucose: <150--IMPROVED WITH ADDITION OF DPP4, post prandial glucose: N/A Denies hypoglycemic/hyperglycemic symptoms Discussed meal planning options and Plate method for healthy eating Avoid sugary drinks and desserts Incorporate balanced protein, non starchy veggies, 1 serving of carbohydrate with each meal Increase water intake Increase physical activity as able Current exercise: N/A Recommended ONGYLZA--EASIEST PATIENT ASSISTANCE PROGRAM FOR DPP4; WOULD CONSIDER GLP1 VS SGLT2--HOWEVER PATIENT HAS STARTED ON SAMPLES OF TRADJENTA AND WOULD LIKE TO CONTINUE; WILL LOOK TO RE-ADDRESS THERAPY  DURING RE-ENROLLMENT Collaborated with PCP REGARDING THERAPY AND PATIENT ASSISTANCE  Assessed patient finances. RE-ENROLLED FOR -->ONGYLZA TO AZ&ME PATIENT ASSISTANCE PROGRAM DISCUSSED BLOOD PRESSURE AND HYPERLIPIDEMIA  Patient Goals/Self-Care Activities Over the next 90 days, patient will:  - take medications as prescribed check glucose 3-4 TIMES WEEKLY OR IF SYMPTOMATIC, document, and provide at future appointments collaborate with provider on medication access solutions  Follow Up Plan: Telephone follow up appointment with care management team member scheduled for: 3 MONTHS   Please call the care guide team at [phone] if you need to cancel or reschedule your appointment.   The patient verbalized understanding of instructions, educational materials, and care plan provided today and declined offer to receive copy of patient instructions, educational materials, and care plan.   Signature [phone], PharmD, BCPS Clinical Pharmacist, [name]  II Phone [phone]

## 2021-01-25 NOTE — Progress Notes (Signed)
Chronic Care Management Pharmacy Note  01/25/2021 Name:  Candace Lopez MRN:  [id number] DOB:  [birth year]  Summary: T2DM  Recommendations/Changes made from today's visit:  Diabetes: Uncontrolled--A1C 7.9%; GFR 68 Current treatment: Continue metformin XR 1G BID Onglyza 45m daily (DPP4) patient has started and is tolerating well  Onglyza is covered via az&me PATIENT ASSISTANCE PROGRAM Refills sent to PCP for cosign--will go to [facility] mail order (pharmacy for AZ&me patient assistance) Current glucose readings: fasting glucose: <150--IMPROVED WITH ADDITION OF DPP4, post prandial glucose: N/A Denies hypoglycemic/hyperglycemic symptoms Discussed meal planning options and Plate method for healthy eating Avoid sugary drinks and desserts Incorporate balanced protein, non starchy veggies, 1 serving of carbohydrate with each meal Increase water intake Increase physical activity as able Current exercise: N/A Recommended ONGYLZA--EASIEST PATIENT ASSISTANCE PROGRAM FOR DPP4; WOULD CONSIDER GLP1 VS SGLT2--HOWEVER PATIENT HAS STARTED ON SAMPLES OF TRADJENTA AND WOULD LIKE TO CONTINUE; WILL LOOK TO RE-ADDRESS THERAPY DURING RE-ENROLLMENT Collaborated with PCP REGARDING THERAPY AND PATIENT ASSISTANCE  Assessed patient finances. RE-ENROLLED FOR -->ONGYLZA TO AZ&ME PATIENT ASSISTANCE PROGRAM DISCUSSED BLOOD PRESSURE AND HYPERLIPIDEMIA  Patient Goals/Self-Care Activities Over the next 90 days, patient will:  - take medications as prescribed check glucose 3-4 TIMES WEEKLY OR IF SYMPTOMATIC, document, and provide at future appointments collaborate with provider on medication access solutions  Follow Up Plan: Telephone follow up appointment with care management team member scheduled for: 3 MONTHS   Plan:  Subjective: Candace MANOCCHIOis an 70y.o. year old female who is a primary patient of GJanora Norlander DO.  The CCM team was consulted for assistance with disease management and care  coordination needs.    Engaged with patient by telephone for follow up visit in response to provider referral for pharmacy case management and/or care coordination services.   Consent to Services:  The patient was given information about Chronic Care Management services, agreed to services, and gave verbal consent prior to initiation of services.  Please see initial visit note for detailed documentation.   Patient Care Team: GJanora Norlander DO as PCP - General (Family Medicine) LHarlen Labs MD as Referring Physician (Optometry) PLavera Guise [facility] Pharmac[facility] ([facility])  Objective:  Lab Results  Component Value Date   CREATININE 0.92 08/28/2020   CREATININE 0.97 12/13/2019   CREATININE 0.95 10/07/2018    Lab Results  Component Value Date   HGBA1C 6.4 (H) 11/27/2020   Last diabetic Eye exam:  Lab Results  Component Value Date/Time   HMDIABEYEEXA No Retinopathy 12/14/2020 12:00 AM    Last diabetic Foot exam: No results found for: HMDIABFOOTEX      Component Value Date/Time   CHOL 130 11/27/2020 1103   TRIG 184 (H) 11/27/2020 1103   HDL 28 (L) 11/27/2020 1103   CHOLHDL 4.6 (H) 11/27/2020 1103   LDLCALC 71 11/27/2020 1103   LDLDIRECT 67 12/13/2019 1429    Hepatic Function Latest Ref Rng & Units 08/28/2020 12/13/2019 10/07/2018  Total Protein 6.0 - 8.5 g/dL 6.6 6.8 6.7  Albumin 3.8 - 4.8 g/dL 4.8 4.5 4.4  AST 0 - 40 IU/L 16 15 13   ALT 0 - 32 IU/L 17 13 13   Alk Phosphatase 44 - 121 IU/L 69 55 53  Total Bilirubin 0.0 - 1.2 mg/dL 1.5(H) 1.0 1.6(H)    Lab Results  Component Value Date/Time   TSH 2.060 05/27/2017 12:08 PM    CBC Latest Ref Rng & Units 05/27/2017 12/29/2016 03/14/2016  WBC 3.4 -  10.8 x10E3/uL 5.7 6.9 5.7  Hemoglobin 11.1 - 15.9 g/dL 13.6 15.5 14.9  Hematocrit 34.0 - 46.6 % 39.7 44.3 42.4  Platelets 150 - 379 x10E3/uL 254 264 241    Lab Results  Component Value Date/Time   VD25OH 27.5 (L) 12/13/2019 02:29 PM    Clinical ASCVD: No   The 10-year ASCVD risk score (Arnett DK, et al., 2019) is: 26.5%   Values used to calculate the score:     Age: 22 years     Sex: Female     Is Non-Hispanic African American: No     Diabetic: Yes     Tobacco smoker: No     Systolic Blood Pressure: 941 mmHg     Is BP treated: Yes     HDL Cholesterol: 28 mg/dL     Total Cholesterol: 130 mg/dL    Other: (CHADS2VASc if Afib, PHQ9 if depression, MMRC or CAT for COPD, ACT, DEXA)  Social History   Tobacco Use  Smoking Status Never  Smokeless Tobacco Never   BP Readings from Last 3 Encounters:  11/27/20 (!) 146/80  09/24/20 138/81  08/28/20 123/73   Pulse Readings from Last 3 Encounters:  11/27/20 72  09/24/20 82  08/28/20 70   Wt Readings from Last 3 Encounters:  11/27/20 157 lb 12.8 oz (71.6 kg)  09/24/20 159 lb (72.1 kg)  09/06/20 159 lb (72.1 kg)    Assessment: Review of patient past medical history, allergies, medications, health status, including review of consultants reports, laboratory and other test data, was performed as part of comprehensive evaluation and provision of chronic care management services.   SDOH:  (Social Determinants of Health) assessments and interventions performed:    CCM Care Plan  Allergies  Allergen Reactions   Tape Rash    Medications Reviewed Today     Reviewed by Lavera Guise, [facility] (Pharmacist) on 01/25/21 at [place] Status: <None>   Medication Order Taking? Sig Documenting Provider Last Dose Status Informant  Alpha-Lipoic Acid 600 MG CAPS [id number] No Take 1 capsule (600 mg total) by mouth daily. For diabetic neuropathy Janora Norlander, DO Taking Active   aspirin EC 81 MG tablet [id number] No Take 81 mg by mouth in the morning and at bedtime. Swallow whole. [provider] Taking Active   atorvastatin (LIPITOR) 40 MG tablet [id number] No TAKE 1 TABLET BY MOUTH EVERYDAY AT BEDTIME Ronnie Doss M, DO Taking Active   Blood Glucose Monitoring Suppl (ONE TOUCH  ULTRA 2) w/Device KIT [id number] No 1 KIT BY DOES NOT APPLY ROUTE ONCE FOR 1 DOSE. [provider] Taking Active Self  lisinopril (ZESTRIL) 10 MG tablet [id number] No TAKE 1 TABLET (10 MG TOTAL) BY MOUTH IN THE MORNING AND AT BEDTIME. (STOP TAKING LISINOPRIL 20MG) Gottschalk, Ashly M, DO Taking Active   metFORMIN (GLUCOPHAGE-XR) 500 MG 24 hr tablet [id number] No TAKE 2 TABLETS BY MOUTH EVERY DAY WITH BREAKFAST Hawks, Christy A, FNP Taking Active   metoprolol tartrate (LOPRESSOR) 25 MG tablet [id number] No Take 25 mg by mouth 2 (two) times daily. [provider] Taking Active   OneTouch Delica Lancets 03T MISC [id number] No Check BS BID Dx E11.40 Janora Norlander, DO Taking Active   [facility] ULTRA test strip [id number] No CHECK BLOOD SUGAR TWICE DAILY DX 11.40 Ronnie Doss M, DO Taking Active   pramipexole (MIRAPEX) 0.125 MG tablet [id number]  Take 1 tablet 2 hours before bedtime for restless legs Janora Norlander, DO  Active  saxagliptin HCl (ONGLYZA) 5 MG TABS tablet [id number] No Take 1 tablet (5 mg total) by mouth daily. Janora Norlander, DO Taking Active            Med Note Lavera Guise   Wed Nov 14, 2020 10:06 AM) [facility] [place] AZ&me patient assistance program  TURMERIC PO [id number] No Take 1 capsule by mouth 2 (two) times daily.  [provider] Taking Active Self  zolpidem (AMBIEN) 5 MG tablet [id number] No take 1 tablet(5 mg total) by mouth at bedtime as needed for sleep Janora Norlander, DO Taking Active             Patient Active Problem List   Diagnosis Date Noted   Mild obesity 09/06/2020   Special screening for malignant neoplasms, colon 02/03/2018   Degenerative disk disease 06/30/2017   Mild obstructive sleep apnea 06/30/2017   Type 2 diabetes mellitus with diabetic neuropathy, unspecified ([place]) 03/14/2016   Hypertension associated with diabetes ([place]) 03/14/2016   Hyperlipidemia associated with type 2 diabetes mellitus ([place]) 03/14/2016    Restless leg syndrome, familial, uncontrolled 03/14/2016   Chest pain 05/06/2012   Health care maintenance 05/04/2012   Urge incontinence 06/28/2011   Left knee pain 01/23/2010    Immunization History  Administered Date(s) Administered   Fluad Quad(high Dose 65+) 10/07/2018, 10/31/2019, 11/05/2020   Influenza, High Dose Seasonal PF 12/29/2016, 10/21/2017   Moderna Sars-Covid-2 Vaccination 04/26/2019, 05/24/2019, 01/24/2020   Pneumococcal Conjugate-13 08/26/2017   Pneumococcal Polysaccharide-23 12/13/2019   Tdap 07/16/2012    Conditions to be addressed/monitored: HLD and DMII  Care Plan : PHARMD MEDICATION MANAGEMENT  Updates made by Lavera Guise, [place] since 01/25/2021 12:00 AM     Problem: DISEASE PROGRESSION PREVENTION      Long-Range Goal: T2DM   This Visit's Progress: On track  Recent Progress: Not on track  Priority: High  Note:   Current Barriers:  Unable to independently afford treatment regimen Unable to achieve control of T2DM  Suboptimal therapeutic regimen for T2DM  Pharmacist Clinical Goal(s):  Over the next 180 days, patient will verbalize ability to afford treatment regimen achieve control of T2DM as evidenced by IMPROVED GLYCEMIC CONTROL/GOAL A1C  through collaboration with PharmD and provider.   Interventions: 1:1 collaboration with Janora Norlander, DO regarding development and update of comprehensive plan of care as evidenced by provider attestation and co-signature Inter-disciplinary care team collaboration (see longitudinal plan of care) Comprehensive medication review performed; medication list updated in electronic medical record  Diabetes: Uncontrolled--A1C 7.9%; GFR 68 Current treatment: Continue metformin XR 1G BID Onglyza 66m daily (DPP4) patient has started and is tolerating well  Onglyza is covered via az&me [place] to PCP for cosign--will go to [facility] mail order (pharmacy for AZ&me patient  assistance) Current glucose readings: fasting glucose: <150--IMPROVED WITH ADDITION OF DPP4, post prandial glucose: N/A Denies hypoglycemic/hyperglycemic symptoms Discussed meal planning options and Plate method for healthy eating Avoid sugary drinks and desserts Incorporate balanced protein, non starchy veggies, 1 serving of carbohydrate with each meal Increase water intake Increase physical activity as able Current exercise: N/A Recommended ONGYLZA--EASIEST PATIENT ASSISTANCE PROGRAM FOR DPP4; WOULD CONSIDER GLP1 VS SGLT2--HOWEVER PATIENT HAS STARTED ON SAMPLES OF TRADJENTA AND WOULD LIKE TO CONTINUE; WILL LOOK TO RE-ADDRESS THERAPY DURING RE-ENROLLMENT Collaborated with PCP REGARDING THERAPY AND PATIENT ASSISTANCE  Assessed patient finances. RE-ENROLLED FOR -->ONGYLZA TO AZ&ME PATIENT ASSISTANCE PROGRAM  Patient Goals/Self-Care Activities Over the next 90 days, patient will:  - take medications  as prescribed check glucose 3-4 TIMES WEEKLY OR IF SYMPTOMATIC, document, and provide at future appointments collaborate with provider on medication access solutions  Follow Up Plan: Telephone follow up appointment with care management team member scheduled for: 3 MONTHS      Medication Assistance: Application for ONGLYZA/AZ&ME PAP  medication assistance program. in process.  Anticipated assistance start date TBD FOR 2023--RE ENROLLMENT.  See plan of care for additional detail.  Patient's preferred pharmacy is:  [id number] [place] [id number]: [phone]: [phone] [facility] [id number]: [phone]: [phone] Uses pill box? No - N/A Pt endorses 100% compliance  Follow Up:  Patient agrees to Care Plan and Follow-up.  Plan: Telephone follow up appointment with care management team member scheduled for:   03/2021   JRegina Eck PharmD, BCPS Clinical Pharmacist, [place] Phone [phone]

## 2021-01-26 DIAGNOSIS — E1140 Type 2 diabetes mellitus with diabetic neuropathy, unspecified: Secondary | ICD-10-CM

## 2021-02-16 ENCOUNTER — Other Ambulatory Visit: Payer: Self-pay | Admitting: Family

## 2021-02-16 ENCOUNTER — Other Ambulatory Visit: Payer: Self-pay | Admitting: Family Medicine

## 2021-02-16 DIAGNOSIS — E1140 Type 2 diabetes mellitus with diabetic neuropathy, unspecified: Secondary | ICD-10-CM

## 2021-02-16 DIAGNOSIS — E1169 Type 2 diabetes mellitus with other specified complication: Secondary | ICD-10-CM

## 2021-02-19 ENCOUNTER — Telehealth: Payer: Self-pay | Admitting: Family Medicine

## 2021-02-19 NOTE — Telephone Encounter (Signed)
Pt called requesting to speak with someone regarding her Rx (Alpha Lipoic Acid). Says [facility] that [facility] no [facility] of ever filling that medicine for her and that insurance wont pay for it. Pt says she has all her receipts from where she has picked up her medicine there so she doesn't know what is going on.

## 2021-02-19 NOTE — Telephone Encounter (Signed)
I spoke to pt and she says her insurance told her she has never had [facility]. Advised pt [facility] would need [facility] call [facility] as [facility] but they would be the ones to verify she did receive the medication and pt voiced understanding.

## 2021-02-20 ENCOUNTER — Telehealth: Payer: Self-pay | Admitting: *Deleted

## 2021-02-20 NOTE — Telephone Encounter (Signed)
PA denial came in for this pt - (we did not do this PA - could be auto generated)   Alpha-Lipoic Acid Cap will no longer be covered by plan  Denial reason:  Drugs are available without prescription and over the counter are excluded from coverage under her part D plan

## 2021-02-20 NOTE — Telephone Encounter (Signed)
I think she notes that this is an over-the-counter product.  We will just try to save her the taxes by sending in a prescription

## 2021-02-28 ENCOUNTER — Other Ambulatory Visit: Payer: Self-pay | Admitting: Family Medicine

## 2021-02-28 DIAGNOSIS — F5101 Primary insomnia: Secondary | ICD-10-CM

## 2021-03-04 ENCOUNTER — Other Ambulatory Visit: Payer: Self-pay | Admitting: Family Medicine

## 2021-03-04 DIAGNOSIS — E1159 Type 2 diabetes mellitus with other circulatory complications: Secondary | ICD-10-CM

## 2021-03-07 DIAGNOSIS — Z23 Encounter for immunization: Secondary | ICD-10-CM | POA: Diagnosis not present

## 2021-03-11 ENCOUNTER — Telehealth: Payer: Self-pay | Admitting: Family Medicine

## 2021-03-11 NOTE — Telephone Encounter (Signed)
Pt aware Ambien is controlled and has to be rx'd during a visit. Pt scheduled for 03/27/21 at 9:30 and to also discuss restless legs.

## 2021-03-27 ENCOUNTER — Encounter: Payer: Self-pay | Admitting: Family Medicine

## 2021-03-27 ENCOUNTER — Ambulatory Visit (INDEPENDENT_AMBULATORY_CARE_PROVIDER_SITE_OTHER): Payer: Medicare HMO | Admitting: Family Medicine

## 2021-03-27 VITALS — BP 134/81 | HR 82 | Temp 97.5°F | Ht 65.0 in | Wt 161.6 lb

## 2021-03-27 DIAGNOSIS — E1159 Type 2 diabetes mellitus with other circulatory complications: Secondary | ICD-10-CM

## 2021-03-27 DIAGNOSIS — E1140 Type 2 diabetes mellitus with diabetic neuropathy, unspecified: Secondary | ICD-10-CM

## 2021-03-27 DIAGNOSIS — F5101 Primary insomnia: Secondary | ICD-10-CM | POA: Diagnosis not present

## 2021-03-27 DIAGNOSIS — E785 Hyperlipidemia, unspecified: Secondary | ICD-10-CM | POA: Diagnosis not present

## 2021-03-27 DIAGNOSIS — E1169 Type 2 diabetes mellitus with other specified complication: Secondary | ICD-10-CM | POA: Diagnosis not present

## 2021-03-27 DIAGNOSIS — I152 Hypertension secondary to endocrine disorders: Secondary | ICD-10-CM

## 2021-03-27 DIAGNOSIS — R69 Illness, unspecified: Secondary | ICD-10-CM | POA: Diagnosis not present

## 2021-03-27 LAB — BAYER DCA HB A1C WAIVED: HB A1C (BAYER DCA - WAIVED): 7 % — ABNORMAL HIGH (ref 4.8–5.6)

## 2021-03-27 MED ORDER — METOPROLOL TARTRATE 25 MG PO TABS: 25.0000 mg | ORAL_TABLET | Freq: Two times a day (BID) | ORAL | 3 refills | Status: DC

## 2021-03-27 MED ORDER — ATORVASTATIN CALCIUM 40 MG PO TABS: ORAL_TABLET | ORAL | 3 refills | Status: DC

## 2021-03-27 MED ORDER — ZOLPIDEM TARTRATE 5 MG PO TABS: ORAL_TABLET | ORAL | 5 refills | Status: DC

## 2021-03-27 MED ORDER — ONETOUCH ULTRA 2 W/DEVICE KIT: PACK | 0 refills | Status: AC

## 2021-03-27 MED ORDER — LISINOPRIL 10 MG PO TABS: ORAL_TABLET | ORAL | 3 refills | Status: DC

## 2021-03-27 NOTE — Progress Notes (Signed)
? ?Subjective: ?CC:DM2 ?PCP: Janora Norlander, DO ?MHW:KGSUPJ H Word is a 71 y.o. female presenting to clinic today for: ? ?1. Type 2 Diabetes with hypertension, hyperlipidemia:  ?Compliant with Onglyza, metformin and lisinopril.  Also taking her statin as directed.  Out of metoprolol.  This was initially prescribed by her cardiologist but she is only seen him once and would like me to renew. ? ?Last eye exam: UTD ?Last foot exam: UTD ?Last A1c:  ?Lab Results  ?Component Value Date  ? HGBA1C 6.4 (H) 11/27/2020  ? ?Nephropathy screen indicated?: On ACEi ?Last flu, zoster and/or pneumovax:  ?Immunization History  ?Administered Date(s) Administered  ? Fluad Quad(high Dose 65+) 10/07/2018, 10/31/2019, 11/05/2020  ? Influenza, High Dose Seasonal PF 12/29/2016, 10/21/2017  ? Moderna Covid-19 Vaccine Bivalent Booster 30yr & up 03/07/2021  ? Moderna Sars-Covid-2 Vaccination 04/26/2019, 05/24/2019, 01/24/2020  ? Pneumococcal Conjugate-13 08/26/2017  ? Pneumococcal Polysaccharide-23 12/13/2019  ? Tdap 07/16/2012  ? ? ?ROS: Denies dizziness, LOC, polyuria, polydipsia, unintended weight loss/gain, foot ulcerations, numbness or tingling in extremities, shortness of breath or chest pain. ?  ?2.  Insomnia ?Patient needs refills on her Ambien.  This was working relatively well for her.  She has been doubling up on her restless leg medications in efforts to get decent night sleep.  She denies having had any complications with her Ambien including visual or auditory hallucinations, sleepwalking, excessive daytime sleepiness or falls ? ?ROS: Per HPI ? ?Allergies  ?Allergen Reactions  ? Tape Rash  ? ?Past Medical History:  ?Diagnosis Date  ? DDD (degenerative disc disease), lumbar   ? Diabetes mellitus without complication ([place]   ? GERD (gastroesophageal reflux disease)   ? Hyperlipidemia   ? Hypertension   ? Urge incontinence of urine   ? ? ?Current Outpatient Medications:  ?  Alpha-Lipoic Acid 600 MG CAPS, Take 1 capsule (600  mg total) by mouth daily. For diabetic neuropathy, Disp: 90 capsule, Rfl: 3 ?  aspirin EC 81 MG tablet, Take 81 mg by mouth in the morning and at bedtime. Swallow whole., Disp: , Rfl:  ?  atorvastatin (LIPITOR) 40 MG tablet, TAKE 1 TABLET BY MOUTH EVERYDAY AT BEDTIME, Disp: 90 tablet, Rfl: 0 ?  Blood Glucose Monitoring Suppl (ONE TOUCH ULTRA 2) w/Device KIT, 1 KIT BY DOES NOT APPLY ROUTE ONCE FOR 1 DOSE., Disp: , Rfl: 0 ?  lisinopril (ZESTRIL) 10 MG tablet, TAKE 1 TABLET (10 MG TOTAL) BY MOUTH IN THE MORNING AND AT BEDTIME. (STOP TAKING LISINOPRIL 20MG), Disp: 180 tablet, Rfl: 0 ?  metFORMIN (GLUCOPHAGE-XR) 500 MG 24 hr tablet, TAKE 2 TABLETS BY MOUTH EVERY DAY WITH BREAKFAST, Disp: 180 tablet, Rfl: 0 ?  metoprolol tartrate (LOPRESSOR) 25 MG tablet, Take 25 mg by mouth 2 (two) times daily., Disp: , Rfl:  ?  OneTouch Delica Lancets 303PMISC, Check BS BID Dx E11.40, Disp: 200 each, Rfl: 3 ?  ONETOUCH ULTRA test strip, CHECK BLOOD SUGAR TWICE DAILY DX 11.40, Disp: 200 strip, Rfl: 4 ?  pramipexole (MIRAPEX) 0.125 MG tablet, Take 1 tablet 2 hours before bedtime for restless legs, Disp: 90 tablet, Rfl: 3 ?  saxagliptin HCl (ONGLYZA) 5 MG TABS tablet, Take 1 tablet (5 mg total) by mouth daily., Disp: 90 tablet, Rfl: 3 ?  TURMERIC PO, Take 1 capsule by mouth 2 (two) times daily. , Disp: , Rfl:  ?  zolpidem (AMBIEN) 5 MG tablet, take 1 tablet(5 mg total) by mouth at bedtime as needed for sleep, Disp:  30 tablet, Rfl: 5 ?Social History  ? ?Socioeconomic History  ? Marital status: Married  ?  Spouse name: Laverna Peace  ? Number of children: 2  ? Years of education: Not on file  ? Highest education level: Not on file  ?Occupational History  ? Occupation: retired  ?Tobacco Use  ? Smoking status: Never  ? Smokeless tobacco: Never  ?Vaping Use  ? Vaping Use: Never used  ?Substance and Sexual Activity  ? Alcohol use: No  ? Drug use: No  ? Sexual activity: Not on file  ?Other Topics Concern  ? Not on file  ?Social History Narrative  ? 2  children  - son lives in [place], daughter 15 minutes away.  ? She and her husband work out at [employer] 3-4 x per week.  ? ?Social Determinants of Health  ? ?Financial Resource Strain: Low Risk   ? Difficulty of Paying Living Expenses: Not hard at all  ?Food Insecurity: No Food Insecurity  ? Worried About Charity fundraiser in the Last Year: Never true  ? Ran Out of Food in the Last Year: Never true  ?Transportation Needs: No Transportation Needs  ? Lack of Transportation (Medical): No  ? Lack of Transportation (Non-Medical): No  ?Physical Activity: Insufficiently Active  ? Days of Exercise per Week: 3 days  ? Minutes of Exercise per Session: 30 min  ?Stress: No Stress Concern Present  ? Feeling of Stress : Not at all  ?Social Connections: Moderately Isolated  ? Frequency of Communication with Friends and Family: More than three times a week  ? Frequency of Social Gatherings with Friends and Family: More than three times a week  ? Attends Religious Services: Never  ? Active Member of Clubs or Organizations: No  ? Attends Archivist Meetings: Never  ? Marital Status: Married  ?Intimate Partner Violence: Not At Risk  ? Fear of Current or Ex-Partner: No  ? Emotionally Abused: No  ? Physically Abused: No  ? Sexually Abused: No  ? ?Family History  ?Problem Relation Age of Onset  ? Cancer Mother   ?     lung cancer  ? Cancer Father   ?     lymphnodes  ? Diabetes Sister   ? Diabetes Brother   ? Diabetes Son   ? Cancer Maternal Aunt   ?     throat  ? Cancer Paternal Uncle   ?     unknown   ? Cancer Maternal Grandfather   ? Heart disease Paternal Grandfather   ? Cancer Other   ?     breast  ? Colon cancer Neg Hx   ? ? ?Objective: ?Office vital signs reviewed. ?BP 134/81   Pulse 82   Temp (!) 97.5 ?F (36.4 ?C)   Ht _0  (1.651 m)   Wt 161 lb 9.6 oz (73.3 kg)   SpO2 97%   BMI 26.89 kg/m?  ? ?Physical Examination:  ?General: Awake, alert, well nourished, No acute distress ?HEENT: Sclera white.  Moist  mucous membranes ?Cardio: regular rate and rhythm, S1S2 heard, no murmurs appreciated ?Pulm: clear to auscultation bilaterally, no wheezes, rhonchi or rales; normal work of breathing on room air ?Psych: Mood stable, speech normal, affect appropriate ? ?Assessment/ Plan: ?71 y.o. female  ? ?Type 2 diabetes mellitus with diabetic neuropathy, without long-term current use of insulin (HCC) - Plan: Bayer DCA Hb A1c Waived, Blood Glucose Monitoring Suppl (ONE TOUCH ULTRA 2) w/Device KIT ? ?Primary insomnia -  Plan: zolpidem (AMBIEN) 5 MG tablet ? ?Hypertension associated with diabetes ([place]) - Plan: metoprolol tartrate (LOPRESSOR) 25 MG tablet, lisinopril (ZESTRIL) 10 MG tablet ? ?Hyperlipidemia associated with type 2 diabetes mellitus ([place]) - Plan: atorvastatin (LIPITOR) 40 MG tablet ? ?Sugar remains controlled with A1c of 7.0.  I am sending her over to some new testing supplies since the numbers that she is getting at home is not consistent with what were seen here on the A1c.  Should have adequate testing supplies otherwise.  We will plan to see children 6 months for full physical exam repeat sugar checkup unless she continues to see elevations in her blood sugar.  I would like to see her sooner ? ?Insomnia has not been controlled but she is been out of her medications.  I have renewed her Ambien.  National narcotic database reviewed and there were no red flags.  She is up-to-date on CSA and UDS.  I implored her to make sure that she has an appropriate 51-monthfollow-up with each visit so that she does not run out of her medications in the future. ? ?Blood pressure is at goal.  No changes.  I have renewed her beta-blocker.  Statin renewed.  Plan for fasting lipid at next visit ? ?Orders Placed This Encounter  ?Procedures  ? Bayer DCA Hb A1c Waived  ? ?No orders of the defined types were placed in this encounter. ? ? ? ?AJanora Norlander DO ?[facility]?[phone] ? ?

## 2021-04-15 DIAGNOSIS — H52229 Regular astigmatism, unspecified eye: Secondary | ICD-10-CM | POA: Diagnosis not present

## 2021-04-15 DIAGNOSIS — Z0100 Encounter for examination of eyes and vision without abnormal findings: Secondary | ICD-10-CM | POA: Diagnosis not present

## 2021-04-15 DIAGNOSIS — E7800 Pure hypercholesterolemia, unspecified: Secondary | ICD-10-CM | POA: Diagnosis not present

## 2021-04-15 DIAGNOSIS — E119 Type 2 diabetes mellitus without complications: Secondary | ICD-10-CM | POA: Diagnosis not present

## 2021-04-17 ENCOUNTER — Telehealth: Payer: Medicare HMO

## 2021-04-23 ENCOUNTER — Encounter: Payer: Self-pay | Admitting: Nurse Practitioner

## 2021-04-23 ENCOUNTER — Ambulatory Visit (INDEPENDENT_AMBULATORY_CARE_PROVIDER_SITE_OTHER): Payer: Medicare HMO | Admitting: Nurse Practitioner

## 2021-04-23 ENCOUNTER — Ambulatory Visit (INDEPENDENT_AMBULATORY_CARE_PROVIDER_SITE_OTHER): Payer: Medicare HMO

## 2021-04-23 VITALS — BP 133/78 | HR 88 | Temp 98.0°F | Ht 65.0 in | Wt 158.0 lb

## 2021-04-23 DIAGNOSIS — R1030 Lower abdominal pain, unspecified: Secondary | ICD-10-CM | POA: Diagnosis not present

## 2021-04-23 DIAGNOSIS — R1032 Left lower quadrant pain: Secondary | ICD-10-CM | POA: Diagnosis not present

## 2021-04-23 NOTE — Progress Notes (Signed)
? ?Acute Office Visit ? ?Subjective:  ? ? Patient ID: Candace Lopez, female    DOB: 10/29/1950, 71 y.o.   MRN: [id number] ? ?Chief Complaint  ?Patient presents with  ? Flank Pain  ?  Lower left side radiating towards the back  ? ? ?Flank Pain ?This is a new problem. The current episode started yesterday. The problem occurs constantly. The problem has been gradually worsening since onset. The quality of the pain is described as aching. The pain does not radiate. The pain is at a severity of 8/10. The pain is severe. The pain is The same all the time. Associated symptoms include abdominal pain. Pertinent negatives include no dysuria, fever or pelvic pain.  ?Abdominal Pain ?This is a new problem. The current episode started yesterday. The onset quality is gradual. The problem occurs constantly. The problem has been gradually worsening. The pain is located in the LLQ. The pain is at a severity of 8/10. The pain is severe. The quality of the pain is aching and sharp. The abdominal pain radiates to the left flank. Pertinent negatives include no constipation, dysuria, fever, hematuria, nausea or vomiting. Nothing aggravates the pain. The pain is relieved by Nothing.  ? ? ?Past Medical History:  ?Diagnosis Date  ? DDD (degenerative disc disease), lumbar   ? Diabetes mellitus without complication ([place])   ? GERD (gastroesophageal reflux disease)   ? Hyperlipidemia   ? Hypertension   ? Urge incontinence of urine   ? ? ?Past Surgical History:  ?Procedure Laterality Date  ? ABDOMINAL HYSTERECTOMY    ? arm surgery    ? COLONOSCOPY N/A 09/22/2018  ? Procedure: COLONOSCOPY;  Surgeon: Rogene [place], MD;  Location: [facility];  Service: Endoscopy;  Laterality: N/A;  830  ? FOOT SURGERY    ? KNEE SURGERY    ? ? ?Family History  ?Problem Relation Age of Onset  ? Cancer Mother   ?     lung cancer  ? Cancer Father   ?     lymphnodes  ? Diabetes Sister   ? Diabetes Brother   ? Diabetes Son   ? Cancer Maternal Aunt   ?     throat  ?  Cancer Paternal Uncle   ?     unknown   ? Cancer Maternal Grandfather   ? Heart disease Paternal Grandfather   ? Cancer Other   ?     breast  ? Colon cancer Neg Hx   ? ? ?Social History  ? ?Socioeconomic History  ? Marital status: Married  ?  Spouse name: Laverna Peace  ? Number of children: 2  ? Years of education: Not on file  ? Highest education level: Not on file  ?Occupational History  ? Occupation: retired  ?Tobacco Use  ? Smoking status: Never  ? Smokeless tobacco: Never  ?Vaping Use  ? Vaping Use: Never used  ?Substance and Sexual Activity  ? Alcohol use: No  ? Drug use: No  ? Sexual activity: Not on file  ?Other Topics Concern  ? Not on file  ?Social History Narrative  ? 2 children  - son lives in [place], daughter 15 minutes away.  ? She and her husband work out at [employer] 3-4 x per week.  ? ?Social Determinants of Health  ? ?Financial Resource Strain: Low Risk   ? Difficulty of Paying Living Expenses: Not hard at all  ?Food Insecurity: No Food Insecurity  ? Worried About Estate manager/land agent  of Food in the Last Year: Never true  ? Ran Out of Food in the Last Year: Never true  ?Transportation Needs: No Transportation Needs  ? Lack of Transportation (Medical): No  ? Lack of Transportation (Non-Medical): No  ?Physical Activity: Insufficiently Active  ? Days of Exercise per Week: 3 days  ? Minutes of Exercise per Session: 30 min  ?Stress: No Stress Concern Present  ? Feeling of Stress : Not at all  ?Social Connections: Moderately Isolated  ? Frequency of Communication with Friends and Family: More than three times a week  ? Frequency of Social Gatherings with Friends and Family: More than three times a week  ? Attends Religious Services: Never  ? Active Member of Clubs or Organizations: No  ? Attends Archivist Meetings: Never  ? Marital Status: Married  ?Intimate Partner Violence: Not At Risk  ? Fear of Current or Ex-Partner: No  ? Emotionally Abused: No  ? Physically Abused: No  ? Sexually Abused: No   ? ? ?Outpatient Medications Prior to Visit  ?Medication Sig Dispense Refill  ? Alpha-Lipoic Acid 600 MG CAPS Take 1 capsule (600 mg total) by mouth daily. For diabetic neuropathy 90 capsule 3  ? aspirin EC 81 MG tablet Take 81 mg by mouth in the morning and at bedtime. Swallow whole.    ? atorvastatin (LIPITOR) 40 MG tablet TAKE 1 TABLET BY MOUTH EVERYDAY AT BEDTIME 90 tablet 3  ? Blood Glucose Monitoring Suppl (ONE TOUCH ULTRA 2) w/Device KIT UAD to check BGs E11.9 1 kit 0  ? lisinopril (ZESTRIL) 10 MG tablet TAKE 1 TABLET (10 MG TOTAL) BY MOUTH IN THE MORNING AND AT BEDTIME. 180 tablet 3  ? metFORMIN (GLUCOPHAGE-XR) 500 MG 24 hr tablet TAKE 2 TABLETS BY MOUTH EVERY DAY WITH BREAKFAST 180 tablet 0  ? metoprolol tartrate (LOPRESSOR) 25 MG tablet Take 1 tablet (25 mg total) by mouth 2 (two) times daily. 180 tablet 3  ? OneTouch Delica Lancets 78M MISC Check BS BID Dx E11.40 200 each 3  ? ONETOUCH ULTRA test strip CHECK BLOOD SUGAR TWICE DAILY DX 11.40 200 strip 4  ? pramipexole (MIRAPEX) 0.125 MG tablet Take 1 tablet 2 hours before bedtime for restless legs 90 tablet 3  ? saxagliptin HCl (ONGLYZA) 5 MG TABS tablet Take 1 tablet (5 mg total) by mouth daily. 90 tablet 3  ? TURMERIC PO Take 1 capsule by mouth 2 (two) times daily.     ? zolpidem (AMBIEN) 5 MG tablet take 1 tablet(5 mg total) by mouth at bedtime as needed for sleep 30 tablet 5  ? ?No facility-administered medications prior to visit.  ? ? ?Allergies  ?Allergen Reactions  ? Tape Rash  ? ? ?Review of Systems  ?Constitutional: Negative.  Negative for fever.  ?HENT: Negative.    ?Gastrointestinal:  Positive for abdominal pain. Negative for blood in stool, constipation, nausea and vomiting.  ?Genitourinary:  Positive for flank pain. Negative for decreased urine volume, difficulty urinating, dysuria, hematuria and pelvic pain.  ?Skin: Negative.  Negative for rash.  ?All other systems reviewed and are negative. ? ?   ?Objective:  ?  ?Physical Exam ?Vitals and  nursing note reviewed.  ?HENT:  ?   Head: Normocephalic.  ?   Right Ear: External ear normal.  ?   Left Ear: External ear normal.  ?   Nose: Nose normal.  ?   Mouth/Throat:  ?   Mouth: Mucous membranes are moist.  ?  Pharynx: Oropharynx is clear.  ?Eyes:  ?   Conjunctiva/sclera: Conjunctivae normal.  ?Cardiovascular:  ?   Rate and Rhythm: Normal rate and regular rhythm.  ?   Pulses: Normal pulses.  ?   Heart sounds: Normal heart sounds.  ?Pulmonary:  ?   Effort: Pulmonary effort is normal.  ?   Breath sounds: Normal breath sounds.  ?Abdominal:  ?   General: Bowel sounds are normal. There is no distension.  ?   Palpations: Abdomen is soft.  ?   Tenderness: There is abdominal tenderness in the left lower quadrant. There is right CVA tenderness and guarding.  ?   Hernia: There is no hernia in the umbilical area, left inguinal area or right inguinal area.  ? ? ?   Comments: LLQ abdominal pain  ?Skin: ?   General: Skin is warm.  ?   Findings: No rash.  ?Neurological:  ?   Mental Status: She is oriented to person, place, and time.  ?Psychiatric:     ?   Behavior: Behavior normal.  ? ? ?BP 133/78   Pulse 88   Temp 98 ?F (36.7 ?C)   Ht 5' 5"  (1.651 m)   Wt 158 lb (71.7 kg)   SpO2 95%   BMI 26.29 kg/m?  ?Wt Readings from Last 3 Encounters:  ?04/23/21 158 lb (71.7 kg)  ?03/27/21 161 lb 9.6 oz (73.3 kg)  ?11/27/20 157 lb 12.8 oz (71.6 kg)  ? ? ?There are no preventive care reminders to display for this patient. ? ?There are no preventive care reminders to display for this patient. ? ? ?Lab Results  ?Component Value Date  ? TSH 2.060 05/27/2017  ? ?Lab Results  ?Component Value Date  ? WBC 5.7 05/27/2017  ? HGB 13.6 05/27/2017  ? HCT 39.7 05/27/2017  ? MCV 92 05/27/2017  ? PLT 254 05/27/2017  ? ?Lab Results  ?Component Value Date  ? NA 140 08/28/2020  ? K 4.5 08/28/2020  ? CO2 22 08/28/2020  ? GLUCOSE 174 (H) 08/28/2020  ? BUN 17 08/28/2020  ? CREATININE 0.92 08/28/2020  ? BILITOT 1.5 (H) 08/28/2020  ? ALKPHOS 69  08/28/2020  ? AST 16 08/28/2020  ? ALT 17 08/28/2020  ? PROT 6.6 08/28/2020  ? ALBUMIN 4.8 08/28/2020  ? CALCIUM 9.6 08/28/2020  ? EGFR 68 08/28/2020  ? ?Lab Results  ?Component Value Date  ? CHOL 130 11/27/2020  ? ?Lab

## 2021-04-23 NOTE — Patient Instructions (Signed)
Abdominal Pain, Adult Many things can cause belly (abdominal) pain. Most times, belly pain is not dangerous. Many cases of belly pain can be watched and treated at home. Sometimes, though, belly pain is serious. Yourdoctor will try to find the cause of your belly pain. Follow these instructions at home:  Medicines Take over-the-counter and prescription medicines only as told by your doctor. Do not take medicines that help you poop (laxatives) unless told by your doctor. General instructions Watch your belly pain for any changes. Drink enough fluid to keep your pee (urine) pale yellow. Keep all follow-up visits as told by your doctor. This is important. Contact a doctor if: Your belly pain changes or gets worse. You are not hungry, or you lose weight without trying. You are having trouble pooping (constipated) or have watery poop (diarrhea) for more than 2-3 days. You have pain when you pee or poop. Your belly pain wakes you up at night. Your pain gets worse with meals, after eating, or with certain foods. You are vomiting and cannot keep anything down. You have a fever. You have blood in your pee. Get help right away if: Your pain does not go away as soon as your doctor says it should. You cannot stop vomiting. Your pain is only in areas of your belly, such as the right side or the left lower part of the belly. You have bloody or black poop, or poop that looks like tar. You have very bad pain, cramping, or bloating in your belly. You have signs of not having enough fluid or water in your body (dehydration), such as: Dark pee, very little pee, or no pee. Cracked lips. Dry mouth. Sunken eyes. Sleepiness. Weakness. You have trouble breathing or chest pain. Summary Many cases of belly pain can be watched and treated at home. Watch your belly pain for any changes. Take over-the-counter and prescription medicines only as told by your doctor. Contact a doctor if your belly pain  changes or gets worse. Get help right away if you have very bad pain, cramping, or bloating in your belly. This information is not intended to replace advice given to you by your health care provider. Make sure you discuss any questions you have with your healthcare provider. Document Revised: 05/24/2018 Document Reviewed: 05/24/2018 Elsevier Patient Education  2022 Elsevier Inc.  

## 2021-04-26 ENCOUNTER — Other Ambulatory Visit: Payer: Self-pay | Admitting: Family Medicine

## 2021-04-26 DIAGNOSIS — E1140 Type 2 diabetes mellitus with diabetic neuropathy, unspecified: Secondary | ICD-10-CM

## 2021-06-03 ENCOUNTER — Encounter: Payer: Self-pay | Admitting: Family Medicine

## 2021-06-03 ENCOUNTER — Ambulatory Visit (INDEPENDENT_AMBULATORY_CARE_PROVIDER_SITE_OTHER): Payer: Medicare HMO | Admitting: Family Medicine

## 2021-06-03 VITALS — BP 159/78 | HR 74 | Temp 97.1°F | Ht 65.0 in | Wt 162.0 lb

## 2021-06-03 DIAGNOSIS — N159 Renal tubulo-interstitial disease, unspecified: Secondary | ICD-10-CM | POA: Diagnosis not present

## 2021-06-03 DIAGNOSIS — R1032 Left lower quadrant pain: Secondary | ICD-10-CM

## 2021-06-03 LAB — URINALYSIS, ROUTINE W REFLEX MICROSCOPIC
Bilirubin, UA: NEGATIVE
Ketones, UA: NEGATIVE
Nitrite, UA: POSITIVE — AB
Specific Gravity, UA: >1.03 — ABNORMAL HIGH (ref 1.005–1.030)
Urobilinogen, Ur: 0.2 mg/dL (ref 0.2–1.0)
pH, UA: 5 (ref 5.0–7.5)

## 2021-06-03 MED ORDER — CEFTRIAXONE SODIUM 1 G IJ SOLR
1.0000 g | Freq: Once | INTRAMUSCULAR | Status: AC
  Administered 2021-06-03: 1 g via INTRAMUSCULAR

## 2021-06-03 MED ORDER — CEPHALEXIN 500 MG PO CAPS: 500.0000 mg | ORAL_CAPSULE | Freq: Four times a day (QID) | ORAL | 0 refills | Status: AC

## 2021-06-03 NOTE — Progress Notes (Signed)
? ?Subjective: ?CC:[name], DO ?Candace Lopez is a 71 y.o. female presenting to clinic today for: ? ?1. LLQ pain ?Patient reports dysuria, LLQ pain that has been ongoing for over a month.  She was seen back in March here at that time endorsed some pain going to the left flank.  KUB was ordered and was negative for any evidence of obstruction or renal stone.  She denies having gotten urinalysis but was concerned about a UTI.  She reports chills.  No reports of nausea or vomiting.  No reports of diarrhea, constipation, blood in stool or abnormal vaginal bleeding. ? ? ?ROS: Per HPI ? ?Allergies  ?Allergen Reactions  ? Tape Rash  ? ?Past Medical History:  ?Diagnosis Date  ? DDD (degenerative disc disease), lumbar   ? Diabetes mellitus without complication ([place])   ? GERD (gastroesophageal reflux disease)   ? Hyperlipidemia   ? Hypertension   ? Urge incontinence of urine   ? ? ?Current Outpatient Medications:  ?  Alpha-Lipoic Acid 600 MG CAPS, Take 1 capsule (600 mg total) by mouth daily. For diabetic neuropathy, Disp: 90 capsule, Rfl: 3 ?  aspirin EC 81 MG tablet, Take 81 mg by mouth in the morning and at bedtime. Swallow whole., Disp: , Rfl:  ?  atorvastatin (LIPITOR) 40 MG tablet, TAKE 1 TABLET BY MOUTH EVERYDAY AT BEDTIME, Disp: 90 tablet, Rfl: 3 ?  Blood Glucose Monitoring Suppl (ONE TOUCH ULTRA 2) w/Device KIT, UAD to check BGs E11.9, Disp: 1 kit, Rfl: 0 ?  lisinopril (ZESTRIL) 10 MG tablet, TAKE 1 TABLET (10 MG TOTAL) BY MOUTH IN THE MORNING AND AT BEDTIME., Disp: 180 tablet, Rfl: 3 ?  metFORMIN (GLUCOPHAGE-XR) 500 MG 24 hr tablet, TAKE 2 TABLETS BY MOUTH EVERY DAY WITH BREAKFAST, Disp: 180 tablet, Rfl: 0 ?  metoprolol tartrate (LOPRESSOR) 25 MG tablet, Take 1 tablet (25 mg total) by mouth 2 (two) times daily., Disp: 180 tablet, Rfl: 3 ?  OneTouch Delica Lancets 42A MISC, Check BS BID Dx E11.40, Disp: 200 each, Rfl: 3 ?  ONETOUCH ULTRA test strip, CHECK BLOOD SUGAR TWICE DAILY DX  11.40, Disp: 200 strip, Rfl: 4 ?  pramipexole (MIRAPEX) 0.125 MG tablet, Take 1 tablet 2 hours before bedtime for restless legs, Disp: 90 tablet, Rfl: 3 ?  saxagliptin HCl (ONGLYZA) 5 MG TABS tablet, Take 1 tablet (5 mg total) by mouth daily., Disp: 90 tablet, Rfl: 3 ?  TURMERIC PO, Take 1 capsule by mouth 2 (two) times daily. , Disp: , Rfl:  ?  zolpidem (AMBIEN) 5 MG tablet, take 1 tablet(5 mg total) by mouth at bedtime as needed for sleep, Disp: 30 tablet, Rfl: 5 ?Social History  ? ?Socioeconomic History  ? Marital status: Married  ?  Spouse name: Candace Lopez  ? Number of children: 2  ? Years of education: Not on file  ? Highest education level: Not on file  ?Occupational History  ? Occupation: retired  ?Tobacco Use  ? Smoking status: Never  ? Smokeless tobacco: Never  ?Vaping Use  ? Vaping Use: Never used  ?Substance and Sexual Activity  ? Alcohol use: No  ? Drug use: No  ? Sexual activity: Not on file  ?Other Topics Concern  ? Not on file  ?Social History Narrative  ? 2 children  - son lives in [place], daughter 15 minutes away.  ? She and her husband work out at [employer] 3-4 x per week.  ? ?Social  Determinants of Health  ? ?Financial Resource Strain: Low Risk   ? Difficulty of Paying Living Expenses: Not hard at all  ?Food Insecurity: No Food Insecurity  ? Worried About Charity fundraiser in the Last Year: Never true  ? Ran Out of Food in the Last Year: Never true  ?Transportation Needs: No Transportation Needs  ? Lack of Transportation (Medical): No  ? Lack of Transportation (Non-Medical): No  ?Physical Activity: Insufficiently Active  ? Days of Exercise per Week: 3 days  ? Minutes of Exercise per Session: 30 min  ?Stress: No Stress Concern Present  ? Feeling of Stress : Not at all  ?Social Connections: Moderately Isolated  ? Frequency of Communication with Friends and Family: More than three times a week  ? Frequency of Social Gatherings with Friends and Family: More than three times a week  ? Attends  Religious Services: Never  ? Active Member of Clubs or Organizations: No  ? Attends Archivist Meetings: Never  ? Marital Status: Married  ?Intimate Partner Violence: Not At Risk  ? Fear of Current or Ex-Partner: No  ? Emotionally Abused: No  ? Physically Abused: No  ? Sexually Abused: No  ? ?Family History  ?Problem Relation Age of Onset  ? Cancer Mother   ?     lung cancer  ? Cancer Father   ?     lymphnodes  ? Diabetes Sister   ? Diabetes Brother   ? Diabetes Son   ? Cancer Maternal Aunt   ?     throat  ? Cancer Paternal Uncle   ?     unknown   ? Cancer Maternal Grandfather   ? Heart disease Paternal Grandfather   ? Cancer Other   ?     breast  ? Colon cancer Neg Hx   ? ? ?Objective: ?Office vital signs reviewed. ?BP (!) 159/78   Pulse 74   Temp (!) 97.1 ?F (36.2 ?C)   Ht 5' 5"  (1.651 m)   Wt 162 lb (73.5 kg)   SpO2 97%   BMI 26.96 kg/m?  ? ?Physical Examination:  ?General: Awake, alert, nontoxic female, No acute distress ?HEENT: Sclera white.  Moist mucous membranes ?GU: Mild left lower quadrant tenderness, mild suprapubic tenderness.  No CVA tenderness to palpation. ? ?Assessment/ Plan: ?71 y.o. female  ? ?Kidney infection - Plan: Urinalysis, Routine w reflex microscopic, cefTRIAXone (ROCEPHIN) injection 1 g, cephALEXin (KEFLEX) 500 MG capsule, Basic Metabolic Panel, CBC ? ?LLQ pain - Plan: Urine Culture, cefTRIAXone (ROCEPHIN) injection 1 g, cephALEXin (KEFLEX) 500 MG capsule, Basic Metabolic Panel, CBC, CANCELED: Urinalysis, Complete ? ?Urinalysis suggestive of urinary tract infection.  Fortunately no lab tech was available for microscopy but given positive nitrates I am going to go ahead and treat her empirically for presumed renal infection.  Keflex ordered to start tomorrow and Rocephin intramuscularly administered today.  We discussed red flag signs and symptoms warranting further evaluation.  If she has ongoing symptoms, low threshold to obtain CT for further evaluation.  We will  reconvene in about 1 month, sooner if concerns arise ? ?Orders Placed This Encounter  ?Procedures  ? Urine Culture  ? Urinalysis, Routine w reflex microscopic  ? ?No orders of the defined types were placed in this encounter. ? ? ? ?Candace Norlander, DO ?[facility] ?[phone] ? ? ?

## 2021-06-03 NOTE — Addendum Note (Signed)
Addended by: Janora Norlander on: 06/03/2021 05:01 PM ? ? Modules accepted: Orders ? ?

## 2021-06-03 NOTE — Patient Instructions (Signed)
Pyelonephritis, Adult  Pyelonephritis is an infection that occurs in the kidney. The kidneys are organs that help clean the blood by moving waste out of the blood and into the pee (urine). This infection can happen quickly, or it can last for a long time. In most cases, it clears up with treatment and does not cause other problems. What are the causes? This condition may be caused by: Germs (bacteria) going from the bladder up to the kidney. This may happen after having a bladder infection. Germs going from the blood to the kidney. What increases the risk? This condition is more likely to develop in: Pregnant women. Older people. People who have any of these conditions: Diabetes. Inflammation of the prostate gland (prostatitis), in males. Kidney stones or bladder stones. Other problems with the kidney or the parts of your body that carry pee from the kidneys to the bladder (ureters). Cancer. People who have a small, thin tube (catheter) placed in the bladder. People who are sexually active. Women who use a medicine that kills sperm (spermicide) to prevent pregnancy. People who have had a prior urinary tract infection (UTI). What are the signs or symptoms? Symptoms of this condition include: Peeing often. A strong urge to pee right away. Burning or stinging when peeing. Belly pain. Back pain. Pain in the side (flank area). Fever or chills. Blood in the pee, or dark pee. Feeling sick to your stomach (nauseous) or throwing up (vomiting). How is this treated? This condition may be treated by: Taking antibiotic medicines by mouth (orally). Drinking enough fluids. If the infection is bad, you may need to stay in the hospital. You may be given antibiotics and fluids that are put directly into a vein through an IV tube. In some cases, other treatments may be needed. Follow these instructions at home: Medicines Take your antibiotic medicine as told by your doctor. Do not stop taking  the antibiotic even if you start to feel better. Take over-the-counter and prescription medicines only as told by your doctor. General instructions  Drink enough fluid to keep your pee pale yellow. Avoid caffeine, tea, and carbonated drinks. Pee (urinate) often. Avoid holding in pee for long periods of time. Pee before and after sex. After pooping (having a bowel movement), women should wipe from front to back. Use each tissue only once. Keep all follow-up visits as told by your doctor. This is important. Contact a doctor if: You do not feel better after 2 days. Your symptoms get worse. You have a fever. Get help right away if: You cannot take your medicine or drink fluids as told. You have chills and shaking. You throw up. You have very bad pain in your side or back. You feel very weak or you pass out (faint). Summary Pyelonephritis is an infection that occurs in the kidney. In most cases, this infection clears up with treatment and does not cause other problems. Take your antibiotic medicine as told by your doctor. Do not stop taking the antibiotic even if you start to feel better. Drink enough fluid to keep your pee pale yellow. This information is not intended to replace advice given to you by your health care provider. Make sure you discuss any questions you have with your health care provider. Document Revised: 08/23/2020 Document Reviewed: 08/23/2020 Elsevier Patient Education  2023 Elsevier Inc.  

## 2021-06-04 ENCOUNTER — Other Ambulatory Visit: Payer: Self-pay | Admitting: Family Medicine

## 2021-06-04 ENCOUNTER — Other Ambulatory Visit: Payer: Medicare HMO

## 2021-06-04 DIAGNOSIS — N159 Renal tubulo-interstitial disease, unspecified: Secondary | ICD-10-CM

## 2021-06-04 DIAGNOSIS — R1032 Left lower quadrant pain: Secondary | ICD-10-CM | POA: Diagnosis not present

## 2021-06-05 LAB — CBC
Hematocrit: 36.6 % (ref 34.0–46.6)
Hemoglobin: 12.8 g/dL (ref 11.1–15.9)
MCH: 31.4 pg (ref 26.6–33.0)
MCHC: 35 g/dL (ref 31.5–35.7)
MCV: 90 fL (ref 79–97)
Platelets: 271 x10E3/uL (ref 150–450)
RBC: 4.08 x10E6/uL (ref 3.77–5.28)
RDW: 13.6 % (ref 11.7–15.4)
WBC: 7 x10E3/uL (ref 3.4–10.8)

## 2021-06-05 LAB — BASIC METABOLIC PANEL
BUN/Creatinine Ratio: 17 (ref 12–28)
BUN: 16 mg/dL (ref 8–27)
CO2: 22 mmol/L (ref 20–29)
Calcium: 9.4 mg/dL (ref 8.7–10.3)
Chloride: 104 mmol/L (ref 96–106)
Creatinine, Ser: 0.93 mg/dL (ref 0.57–1.00)
Glucose: 132 mg/dL — ABNORMAL HIGH (ref 70–99)
Potassium: 5.1 mmol/L (ref 3.5–5.2)
Sodium: 138 mmol/L (ref 134–144)
eGFR: 67 mL/min/{1.73_m2} (ref >59)

## 2021-06-06 LAB — URINE CULTURE

## 2021-06-12 ENCOUNTER — Ambulatory Visit (INDEPENDENT_AMBULATORY_CARE_PROVIDER_SITE_OTHER): Payer: Medicare HMO | Admitting: Pharmacist

## 2021-06-12 DIAGNOSIS — E1140 Type 2 diabetes mellitus with diabetic neuropathy, unspecified: Secondary | ICD-10-CM

## 2021-06-12 DIAGNOSIS — E1169 Type 2 diabetes mellitus with other specified complication: Secondary | ICD-10-CM

## 2021-06-12 NOTE — Progress Notes (Signed)
Chronic Care Management Pharmacy Note  06/12/2021 Name:  Candace Lopez MRN:  [id number] DOB:  Jul 05, 1950  Summary:  Diabetes: Uncontrolled--A1C 7.9%; GFR 68 Current treatment: Continue metformin XR 1G with breakfast Onglyza 22m daily (DPP4) patient continues with no side effects  Discussed starting glyxambi 10/521monce daily (would d/c onglyza; able to get via AZ&me patient assistance;  blood sugars continue to rise) Consider changing to GLP1 therapy as well Patient would like to wait until PCP follow up to discuss and repeat A1c Current glucose readings: fasting glucose: <150--IMPROVED WITH ADDITION OF DPP4, post prandial glucose: N/A Denies hypoglycemic/hyperglycemic symptoms Discussed meal planning options and Plate method for healthy eating Avoid sugary drinks and desserts Incorporate balanced protein, non starchy veggies, 1 serving of carbohydrate with each meal Increase water intake Increase physical activity as able Current exercise: N/A Lipid Panel -- LDL at goal, continue current therapy    Component Value Date/Time   CHOL 130 11/27/2020 1103   TRIG 184 (H) 11/27/2020 1103   HDL 28 (L) 11/27/2020 1103   CHOLHDL 4.6 (H) 11/27/2020 1103   LDLCALC 71 11/27/2020 1103   LDLDIRECT 67 12/13/2019 1429   LABVLDL 31 11/27/2020 1103   Recommended gylxambi vs GLP1 Collaborated with PCP REGARDING THERAPY AND PATIENT ASSISTANCE  Assessed patient finances. RE-ENROLLED for ONGYLZA AZ&ME PATIENT ASSISTANCE PROGRAM   Subjective: [name] an 71.o. year old female who is a primary patient of GoJanora NorlanderDO.  The CCM team was consulted for assistance with disease management and care coordination needs.    Engaged with patient by telephone for follow up visit in response to provider referral for pharmacy case management and/or care coordination services.   Consent to Services:  The patient was given information about Chronic Care Management services, agreed to  services, and gave verbal consent prior to initiation of services.  Please see initial visit note for detailed documentation.   Patient Care Team: GoJanora NorlanderDO as PCP - General (Family Medicine) LeHarlen LabsMD as Referring Physician (Optometry) PrLavera GuiseRPBaptist Medical Center Easts Pharmacist (Family Medicine)  Objective:  Lab Results  Component Value Date   CREATININE 0.93 06/04/2021   CREATININE 0.92 08/28/2020   CREATININE 0.97 12/13/2019    Lab Results  Component Value Date   HGBA1C 7.0 (H) 03/27/2021   Last diabetic Eye exam:  Lab Results  Component Value Date/Time   HMDIABEYEEXA No Retinopathy 12/14/2020 12:00 AM    Last diabetic Foot exam: No results found for: HMDIABFOOTEX      Component Value Date/Time   CHOL 130 11/27/2020 1103   TRIG 184 (H) 11/27/2020 1103   HDL 28 (L) 11/27/2020 1103   CHOLHDL 4.6 (H) 11/27/2020 1103   LDLCALC 71 11/27/2020 1103   LDLDIRECT 67 12/13/2019 1429       Latest Ref Rng & Units 08/28/2020    1:27 PM 12/13/2019    2:29 PM 10/07/2018    8:04 AM  Hepatic Function  Total Protein 6.0 - 8.5 g/dL 6.6   6.8   6.7    Albumin 3.8 - 4.8 g/dL 4.8   4.5   4.4    AST 0 - 40 IU/L 16   15   13     ALT 0 - 32 IU/L 17   13   13     Alk Phosphatase 44 - 121 IU/L 69   55   53    Total Bilirubin 0.0 - 1.2 mg/dL 1.5  1.0   1.6      Lab Results  Component Value Date/Time   TSH 2.060 05/27/2017 12:08 PM       Latest Ref Rng & Units 06/04/2021    1:08 PM 05/27/2017   12:08 PM 12/29/2016    2:20 PM  CBC  WBC 3.4 - 10.8 x10E3/uL 7.0   5.7   6.9    Hemoglobin 11.1 - 15.9 g/dL 12.8   13.6   15.5    Hematocrit 34.0 - 46.6 % 36.6   39.7   44.3    Platelets 150 - 450 x10E3/uL 271   254   264      Lab Results  Component Value Date/Time   VD25OH 27.5 (L) 12/13/2019 02:29 PM    Clinical ASCVD: No  The 10-year ASCVD risk score (Arnett DK, et al., 2019) is: 46.1%   Values used to calculate the score:     Age: 71 years     Sex: Female     Is  Non-Hispanic African American: No     Diabetic: Yes     Tobacco smoker: Yes     Systolic Blood Pressure: 324 mmHg     Is BP treated: Yes     HDL Cholesterol: 28 mg/dL     Total Cholesterol: 130 mg/dL    Other: (CHADS2VASc if Afib, PHQ9 if depression, MMRC or CAT for COPD, ACT, DEXA)  Social History   Tobacco Use  Smoking Status Never  Smokeless Tobacco Never   BP Readings from Last 3 Encounters:  06/03/21 (!) 159/78  04/23/21 133/78  03/27/21 134/81   Pulse Readings from Last 3 Encounters:  06/03/21 74  04/23/21 88  03/27/21 82   Wt Readings from Last 3 Encounters:  06/03/21 162 lb (73.5 kg)  04/23/21 158 lb (71.7 kg)  03/27/21 161 lb 9.6 oz (73.3 kg)    Assessment: Review of patient past medical history, allergies, medications, health status, including review of consultants reports, laboratory and other test data, was performed as part of comprehensive evaluation and provision of chronic care management services.   SDOH:  (Social Determinants of Health) assessments and interventions performed:    CCM Care Plan  Allergies  Allergen Reactions   Tape Rash    Medications Reviewed Today     Reviewed by Lavera Guise, [facility] (Pharmacist) on 06/26/21 at 1102  Med List Status: <None>   Medication Order Taking? Sig Documenting Provider Last Dose Status Informant  Alpha-Lipoic Acid 600 MG CAPS [id number] No Take 1 capsule (600 mg total) by mouth daily. For diabetic neuropathy Janora Norlander, DO Taking Active   aspirin EC 81 MG tablet [id number] No Take 81 mg by mouth in the morning and at bedtime. Swallow whole. [provider] Taking Active   atorvastatin (LIPITOR) 40 MG tablet [id number] No TAKE 1 TABLET BY MOUTH EVERYDAY AT BEDTIME Ronnie Doss M, DO Taking Active   Blood Glucose Monitoring Suppl (ONE TOUCH ULTRA 2) w/Device KIT [id number] No UAD to check BGs E11.9 Ronnie Doss M, DO Taking Active   lisinopril (ZESTRIL) 10 MG tablet [id number] No TAKE  1 TABLET (10 MG TOTAL) BY MOUTH IN THE MORNING AND AT BEDTIME. Ronnie Doss M, DO Taking Active   metFORMIN (GLUCOPHAGE-XR) 500 MG 24 hr tablet [id number]  TAKE 2 TABLETS BY MOUTH EVERY DAY WITH BREAKFAST Ronnie Doss M, DO  Active   metoprolol tartrate (LOPRESSOR) 25 MG tablet [id number] No Take 1 tablet (25 mg total) by mouth 2 (two)  times daily. Janora Norlander, DO Taking Active   OneTouch Delica Lancets 40H MISC [id number] No Check BS BID Dx E11.40 Janora Norlander, DO Taking Active   [facility] ULTRA test strip [id number] No CHECK BLOOD SUGAR TWICE DAILY DX 11.40 Ronnie Doss M, DO Taking Active   pramipexole (MIRAPEX) 0.125 MG tablet [id number] No Take 1 tablet 2 hours before bedtime for restless legs Ronnie Doss M, DO Taking Active   saxagliptin HCl (ONGLYZA) 5 MG TABS tablet [id number]  Take 1 tablet (5 mg total) by mouth daily. Janora Norlander, DO  Active   TURMERIC PO [id number] No Take 1 capsule by mouth 2 (two) times daily.  [provider] Taking Active Self  zolpidem (AMBIEN) 5 MG tablet [id number] No take 1 tablet(5 mg total) by mouth at bedtime as needed for sleep Janora Norlander, DO Taking Active             Patient Active Problem List   Diagnosis Date Noted   Mild obesity 09/06/2020   Special screening for malignant neoplasms, colon 02/03/2018   Degenerative disk disease 06/30/2017   Mild obstructive sleep apnea 06/30/2017   Type 2 diabetes mellitus with diabetic neuropathy, unspecified ([place]) 03/14/2016   Hypertension associated with diabetes ([place]) 03/14/2016   Hyperlipidemia associated with type 2 diabetes mellitus ([place]) 03/14/2016   Restless leg syndrome, familial, uncontrolled 03/14/2016   Chest pain 05/06/2012   Health care maintenance 05/04/2012   Urge incontinence 06/28/2011   Left knee pain 01/23/2010    Immunization History  Administered Date(s) Administered   Fluad Quad(high Dose 65+) 10/07/2018, 10/31/2019, 11/05/2020    Influenza, High Dose Seasonal PF 12/29/2016, 10/21/2017   Moderna Covid-19 Vaccine Bivalent Booster 74yr & up 03/07/2021   Moderna Sars-Covid-2 Vaccination 04/26/2019, 05/24/2019, 01/24/2020   Pneumococcal Conjugate-13 08/26/2017   Pneumococcal Polysaccharide-23 12/13/2019   Tdap 07/16/2012    Conditions to be addressed/monitored: DMII  Care Plan : PHARMD MEDICATION MANAGEMENT  Updates made by PLavera Guise R[place]us [place]since 06/26/2021 12:00 AM     Problem: DISEASE PROGRESSION PREVENTION      Long-Range Goal: T2DM   Recent Progress: On track  Priority: High  Note:   Current Barriers:  Unable to independently afford treatment regimen Unable to achieve control of T2DM  Suboptimal therapeutic regimen for T2DM  Pharmacist Clinical Goal(s):  Over the next 180 days, patient will verbalize ability to afford treatment regimen achieve control of T2DM as evidenced by IMPROVED GLYCEMIC CONTROL/GOAL A1C  through collaboration with PharmD and provider.   Interventions: 1:1 collaboration with GJanora Norlander DO regarding development and update of comprehensive plan of care as evidenced by provider attestation and co-signature Inter-disciplinary care team collaboration (see longitudinal plan of care) Comprehensive medication review performed; medication list updated in electronic medical record  Diabetes: Uncontrolled--A1C 7.9%; GFR 68 Current treatment: Continue metformin XR 1G with breakfast Onglyza 526mdaily (DPP4) patient continues with no side effects  Discussed starting glyxambi 10/98m66mnce daily (would d/c onglyza; able to get via AZ&me patient assistance;  blood sugars continue to rise) Consider changing to GLP1 therapy as well Patient would like to wait until PCP follow up to discuss and repeat A1c Current glucose readings: fasting glucose: <150--IMPROVED WITH ADDITION OF DPP4, post prandial glucose: N/A Denies hypoglycemic/hyperglycemic symptoms Discussed meal planning  options and Plate method for healthy eating Avoid sugary drinks and desserts Incorporate balanced protein, non starchy veggies, 1 serving of carbohydrate with each meal Increase water intake Increase physical activity as able  Current exercise: N/A Lipid Panel -- LDL at goal, continue current therapy    Component Value Date/Time   CHOL 130 11/27/2020 1103   TRIG 184 (H) 11/27/2020 1103   HDL 28 (L) 11/27/2020 1103   CHOLHDL 4.6 (H) 11/27/2020 1103   LDLCALC 71 11/27/2020 1103   LDLDIRECT 67 12/13/2019 1429   LABVLDL 31 11/27/2020 1103  Recommended gylxambi vs GLP1 Collaborated with PCP REGARDING THERAPY AND PATIENT ASSISTANCE  Assessed patient finances. RE-ENROLLED for ONGYLZA AZ&ME PATIENT ASSISTANCE PROGRAM  Patient Goals/Self-Care Activities Over the next 90 days, patient will:  - take medications as prescribed check glucose 3-4 TIMES WEEKLY OR IF SYMPTOMATIC, document, and provide at future appointments collaborate with provider on medication access solutions  Follow Up Plan: Telephone follow up appointment with care management team member scheduled for: 3 MONTHS      Plan: The patient has been provided with contact information for the care management team and has been advised to call with any health related questions or concerns.     Regina Eck, PharmD, BCPS Clinical Pharmacist, [place]  II Phone [phone]

## 2021-06-26 DIAGNOSIS — E1140 Type 2 diabetes mellitus with diabetic neuropathy, unspecified: Secondary | ICD-10-CM

## 2021-06-26 DIAGNOSIS — E1169 Type 2 diabetes mellitus with other specified complication: Secondary | ICD-10-CM

## 2021-06-26 DIAGNOSIS — E785 Hyperlipidemia, unspecified: Secondary | ICD-10-CM

## 2021-06-26 MED ORDER — SAXAGLIPTIN HCL 5 MG PO TABS: 5.0000 mg | ORAL_TABLET | Freq: Every day | ORAL | 3 refills | Status: DC

## 2021-06-26 NOTE — Patient Instructions (Signed)
Visit Information  Following are the goals we discussed today:  Current Barriers:  Unable to independently afford treatment regimen Unable to achieve control of T2DM  Suboptimal therapeutic regimen for T2DM  Pharmacist Clinical Goal(s):  Over the next 180 days, patient will verbalize ability to afford treatment regimen achieve control of T2DM as evidenced by IMPROVED GLYCEMIC CONTROL/GOAL A1C through collaboration with PharmD and provider.   Interventions: 1:1 collaboration with [name], DO regarding development and update of comprehensive plan of care as evidenced by provider attestation and co-signature Inter-disciplinary care team collaboration (see longitudinal plan of care) Comprehensive medication review performed; medication list updated in electronic medical record  Diabetes: Uncontrolled--A1C 7.9%; GFR 68 Current treatment: Continue metformin XR 1G with breakfast Onglyza 5mg  daily (DPP4) patient continues with no side effects  Discussed starting glyxambi 10/5mg  once daily (would d/c onglyza; able to get via AZ&me patient assistance;  blood sugars continue to rise) Consider changing to GLP1 therapy as well Patient would like to wait until PCP follow up to discuss and repeat A1c Current glucose readings: fasting glucose: <150--IMPROVED WITH ADDITION OF DPP4, post prandial glucose: N/A Denies hypoglycemic/hyperglycemic symptoms Discussed meal planning options and Plate method for healthy eating Avoid sugary drinks and desserts Incorporate balanced protein, non starchy veggies, 1 serving of carbohydrate with each meal Increase water intake Increase physical activity as able Current exercise: N/A Lipid Panel -- LDL at goal, continue current therapy    Component Value Date/Time   CHOL 130 11/27/2020 1103   TRIG 184 (H) 11/27/2020 1103   HDL 28 (L) 11/27/2020 1103   CHOLHDL 4.6 (H) 11/27/2020 1103   LDLCALC 71 11/27/2020 1103   LDLDIRECT 67 12/13/2019 1429    LABVLDL 31 11/27/2020 1103   Recommended gylxambi vs GLP1 Collaborated with PCP REGARDING THERAPY AND PATIENT ASSISTANCE  Assessed patient finances. RE-ENROLLED for ONGYLZA AZ&ME PATIENT ASSISTANCE PROGRAM  Patient Goals/Self-Care Activities Over the next 90 days, patient will:  - take medications as prescribed check glucose 3-4 TIMES WEEKLY OR IF SYMPTOMATIC, document, and provide at future appointments collaborate with provider on medication access solutions  Follow Up Plan: Telephone follow up appointment with care management team member scheduled for: 3 MONTHS   Plan: Telephone follow up appointment with care management team member scheduled for:  3 months  Signature 13/01/2020, PharmD, BCPS Clinical Pharmacist, [facility]  II Phone [phone]   Please call the care guide team at [phone] if you need to cancel or reschedule your appointment.   Patient verbalizes understanding of instructions and care plan provided today and agrees to view in MyChart. Active MyChart status and patient understanding of how to access instructions and care plan via MyChart confirmed with patient.

## 2021-07-01 ENCOUNTER — Encounter: Payer: Self-pay | Admitting: Family Medicine

## 2021-07-01 ENCOUNTER — Ambulatory Visit (INDEPENDENT_AMBULATORY_CARE_PROVIDER_SITE_OTHER): Payer: Medicare HMO | Admitting: Family Medicine

## 2021-07-01 VITALS — BP 131/86 | HR 79 | Temp 97.5°F | Ht 65.0 in | Wt 162.4 lb

## 2021-07-01 DIAGNOSIS — F5101 Primary insomnia: Secondary | ICD-10-CM | POA: Diagnosis not present

## 2021-07-01 DIAGNOSIS — I152 Hypertension secondary to endocrine disorders: Secondary | ICD-10-CM | POA: Diagnosis not present

## 2021-07-01 DIAGNOSIS — E785 Hyperlipidemia, unspecified: Secondary | ICD-10-CM | POA: Diagnosis not present

## 2021-07-01 DIAGNOSIS — E1159 Type 2 diabetes mellitus with other circulatory complications: Secondary | ICD-10-CM | POA: Diagnosis not present

## 2021-07-01 DIAGNOSIS — E1169 Type 2 diabetes mellitus with other specified complication: Secondary | ICD-10-CM

## 2021-07-01 DIAGNOSIS — E1140 Type 2 diabetes mellitus with diabetic neuropathy, unspecified: Secondary | ICD-10-CM

## 2021-07-01 DIAGNOSIS — B3731 Acute candidiasis of vulva and vagina: Secondary | ICD-10-CM

## 2021-07-01 DIAGNOSIS — R69 Illness, unspecified: Secondary | ICD-10-CM | POA: Diagnosis not present

## 2021-07-01 LAB — BAYER DCA HB A1C WAIVED: HB A1C (BAYER DCA - WAIVED): 6.7 % — ABNORMAL HIGH (ref 4.8–5.6)

## 2021-07-01 MED ORDER — ZOLPIDEM TARTRATE 5 MG PO TABS: ORAL_TABLET | ORAL | 5 refills | Status: DC

## 2021-07-01 MED ORDER — FLUCONAZOLE 150 MG PO TABS: 150.0000 mg | ORAL_TABLET | Freq: Once | ORAL | 0 refills | Status: AC

## 2021-07-01 NOTE — Progress Notes (Signed)
Subjective: CC: Chronic follow-up PCP: Janora Norlander, DO AVW:PVXYIA Candace Lopez is a 71 y.o. female presenting to clinic today for:  1.  Yeast vaginitis Patient reports some vaginal irritation ever since she completed an antibiotic.  She feels like it is getting slightly better but still quite itchy  2.  Insomnia Patient reports good control of insomnia with Ambien 5 mg nightly.  She denies any excessive daytime sedation, falls, respiratory depression, visual or auditory hallucinations or sleepwalking.  She often needs to use the Mirapex at bedtime as well to control restless legs.  She certainly never skips a dose because her legs will just be moving all over the place.  3. Type 2 Diabetes with hypertension, hyperlipidemia:  She is compliant with all meds.  No hypoglycemic episodes.  Sometimes blood sugars will get up into the 180s.  Last eye exam: Up-to-date.   Last foot exam: Up-to-date Last A1c:  Lab Results  Component Value Date   HGBA1C 7.0 (Candace) 03/27/2021   Nephropathy screen indicated?:  On ACE inhibitor Last flu, zoster and/or pneumovax:  Immunization History  Administered Date(s) Administered   Fluad Quad(high Dose 65+) 10/07/2018, 10/31/2019, 11/05/2020   Influenza, High Dose Seasonal PF 12/29/2016, 10/21/2017   Moderna Covid-19 Vaccine Bivalent Booster 32yr & up 03/07/2021   Moderna Sars-Covid-2 Vaccination 04/26/2019, 05/24/2019, 01/24/2020   Pneumococcal Conjugate-13 08/26/2017   Pneumococcal Polysaccharide-23 12/13/2019   Tdap 07/16/2012    ROS: Denies dizziness, LOC, polyuria, polydipsia, unintended weight loss/gain, foot ulcerations, numbness or tingling in extremities, shortness of breath or chest pain.    ROS: Per HPI  Allergies  Allergen Reactions   Tape Rash   Past Medical History:  Diagnosis Date   DDD (degenerative disc disease), lumbar    Diabetes mellitus without complication (HCC)    GERD (gastroesophageal reflux disease)     Hyperlipidemia    Hypertension    Urge incontinence of urine     Current Outpatient Medications:    Alpha-Lipoic Acid 600 MG CAPS, Take 1 capsule (600 mg total) by mouth daily. For diabetic neuropathy, Disp: 90 capsule, Rfl: 3   aspirin EC 81 MG tablet, Take 81 mg by mouth in the morning and at bedtime. Swallow whole., Disp: , Rfl:    atorvastatin (LIPITOR) 40 MG tablet, TAKE 1 TABLET BY MOUTH EVERYDAY AT BEDTIME, Disp: 90 tablet, Rfl: 3   Blood Glucose Monitoring Suppl (ONE TOUCH ULTRA 2) w/Device KIT, UAD to check BGs E11.9, Disp: 1 kit, Rfl: 0   lisinopril (ZESTRIL) 10 MG tablet, TAKE 1 TABLET (10 MG TOTAL) BY MOUTH IN THE MORNING AND AT BEDTIME., Disp: 180 tablet, Rfl: 3   metFORMIN (GLUCOPHAGE-XR) 500 MG 24 hr tablet, TAKE 2 TABLETS BY MOUTH EVERY DAY WITH BREAKFAST, Disp: 180 tablet, Rfl: 0   metoprolol tartrate (LOPRESSOR) 25 MG tablet, Take 1 tablet (25 mg total) by mouth 2 (two) times daily., Disp: 180 tablet, Rfl: 3   OneTouch Delica Lancets 316PMISC, Check BS BID Dx E11.40, Disp: 200 each, Rfl: 3   ONETOUCH ULTRA test strip, CHECK BLOOD SUGAR TWICE DAILY DX 11.40, Disp: 200 strip, Rfl: 4   pramipexole (MIRAPEX) 0.125 MG tablet, Take 1 tablet 2 hours before bedtime for restless legs, Disp: 90 tablet, Rfl: 3   saxagliptin HCl (ONGLYZA) 5 MG TABS tablet, Take 1 tablet (5 mg total) by mouth daily., Disp: 90 tablet, Rfl: 3   TURMERIC PO, Take 1 capsule by mouth 2 (two) times daily. , Disp: , Rfl:  zolpidem (AMBIEN) 5 MG tablet, take 1 tablet(5 mg total) by mouth at bedtime as needed for sleep, Disp: 30 tablet, Rfl: 5 Social History   Socioeconomic History   Marital status: Married    Spouse name: Jimmy   Number of children: 2   Years of education: Not on file   Highest education level: Not on file  Occupational History   Occupation: retired  Tobacco Use   Smoking status: Never   Smokeless tobacco: Never  Vaping Use   Vaping Use: Never used  Substance and Sexual Activity    Alcohol use: No   Drug use: No   Sexual activity: Not on file  Other Topics Concern   Not on file  Social History Narrative   2 children  - son lives in [place], daughter 15 minutes away.   She and her husband work out at [employer] 3-4 x per week.   Social Determinants of Health   Financial Resource Strain: Low Risk    Difficulty of Paying Living Expenses: Not hard at all  Food Insecurity: No Food Insecurity   Worried About Charity fundraiser in the Last Year: Never true   [place] in the Last Year: Never true  Transportation Needs: No Transportation Needs   Lack of Transportation (Medical): No   Lack of Transportation (Non-Medical): No  Physical Activity: Insufficiently Active   Days of Exercise per Week: 3 days   Minutes of Exercise per Session: 30 min  Stress: No Stress Concern Present   Feeling of Stress : Not at all  Social Connections: Moderately Isolated   Frequency of Communication with Friends and Family: More than three times a week   Frequency of Social Gatherings with Friends and Family: More than three times a week   Attends Religious Services: Never   Marine scientist or Organizations: No   Attends Music therapist: Never   Marital Status: Married  Human resources officer Violence: Not At Risk   Fear of Current or Ex-Partner: No   Emotionally Abused: No   Physically Abused: No   Sexually Abused: No   Family History  Problem Relation Age of Onset   Cancer Mother        lung cancer   Cancer Father        lymphnodes   Diabetes Sister    Diabetes Brother    Diabetes Son    Cancer Maternal Aunt        throat   Cancer Paternal Uncle        unknown    Cancer Maternal Grandfather    Heart disease Paternal Grandfather    Cancer Other        breast   Colon cancer Neg Hx     Objective: Office vital signs reviewed. BP 131/86   Pulse 79   Temp (!) 97.5 F (36.4 C)   Ht 5' 5"  (1.651 m)   Wt 162 lb 6.4 oz (73.7 kg)   SpO2  96%   BMI 27.02 kg/m   Physical Examination:  General: Awake, alert, well nourished, No acute distress HEENT: Sclera white.  Moist mucous membranes Cardio: regular rate and rhythm, S1S2 heard, no murmurs appreciated Pulm: clear to auscultation bilaterally, no wheezes, rhonchi or rales; normal work of breathing on room air Extremities: warm, well perfused, No edema, cyanosis or clubbing; +2 pulses bilaterally MSK: normal gait and station Skin: dry; intact; no rashes or lesions Neuro: No tremor appreciated  Assessment/  Plan: 71 y.o. female   Type 2 diabetes mellitus with diabetic neuropathy, without long-term current use of insulin ([place]) - Plan: Bayer DCA Hb A1c Waived  Hyperlipidemia associated with type 2 diabetes mellitus ([place])  Hypertension associated with diabetes ([place])  Primary insomnia - Plan: zolpidem (AMBIEN) 5 MG tablet  Yeast vaginitis - Plan: fluconazole (DIFLUCAN) 150 MG tablet  Sugar under excellent control with A1c of 6.7 today.  Continue current regimen.  Hopefully she get back in the saddle with exercise soon  Continue statin.  Not yet due for fasting lipid  Blood pressure well controlled.  No changes  Ambien renewed placed on file.  She may follow-up in 6 months, sooner if concerns arise.  Up-to-date on UDS and CSC.  National narcotic database reviewed and there were no red flags  Diflucan for yeast vaginitis.  She may follow-up as needed that issue  Orders Placed This Encounter  Procedures   Bayer DCA Hb A1c Waived   No orders of the defined types were placed in this encounter.    Janora Norlander, DO [place] [phone]

## 2021-07-01 NOTE — Patient Instructions (Signed)
Last colonoscopy was with Dr Karilyn Cota in 2020.  He said 10 year follow up based on that test so technically not due until 2030.  He apparently didn't take any polyps from you in 2020. The timing is based soley on the LAST colon test, even though you've had polyps in the past.  Maybe that's why they wouldn't schedule you this time?

## 2021-07-30 ENCOUNTER — Other Ambulatory Visit: Payer: Self-pay | Admitting: Family Medicine

## 2021-07-30 DIAGNOSIS — E1140 Type 2 diabetes mellitus with diabetic neuropathy, unspecified: Secondary | ICD-10-CM

## 2021-08-16 ENCOUNTER — Telehealth: Payer: Self-pay | Admitting: Family Medicine

## 2021-08-16 ENCOUNTER — Other Ambulatory Visit: Payer: Self-pay | Admitting: Family Medicine

## 2021-08-16 DIAGNOSIS — G2581 Restless legs syndrome: Secondary | ICD-10-CM

## 2021-08-16 MED ORDER — PRAMIPEXOLE DIHYDROCHLORIDE 0.25 MG PO TABS: ORAL_TABLET | ORAL | 3 refills | Status: DC

## 2021-08-16 NOTE — Telephone Encounter (Signed)
Pt aware we sent meds

## 2021-08-16 NOTE — Telephone Encounter (Signed)
Pt aware.

## 2021-08-16 NOTE — Telephone Encounter (Signed)
Please inform her that I have adjusted dose to 0.25mg  daily so that her ins will cover increased dose

## 2021-08-29 ENCOUNTER — Other Ambulatory Visit (HOSPITAL_COMMUNITY): Payer: Self-pay | Admitting: Family Medicine

## 2021-08-29 DIAGNOSIS — Z1231 Encounter for screening mammogram for malignant neoplasm of breast: Secondary | ICD-10-CM

## 2021-09-09 ENCOUNTER — Ambulatory Visit (INDEPENDENT_AMBULATORY_CARE_PROVIDER_SITE_OTHER): Payer: Medicare HMO

## 2021-09-09 VITALS — Wt 165.0 lb

## 2021-09-09 DIAGNOSIS — Z0000 Encounter for general adult medical examination without abnormal findings: Secondary | ICD-10-CM

## 2021-09-09 NOTE — Progress Notes (Signed)
Subjective:   Candace Lopez is a 71 y.o. female who presents for Medicare Annual (Subsequent) preventive examination.  Virtual Visit via Telephone Note  I connected with  Candace Lopez on 09/09/21 at  1:15 PM EDT by telephone and verified that I am speaking with the correct person using two identifiers.  Location: Patient: Home Provider: [facility] Persons participating in the virtual visit: patient/Nurse Health Advisor   I discussed the limitations, risks, security and privacy concerns of performing an evaluation and management service by telephone and the availability of in person appointments. The patient expressed understanding and agreed to proceed.  Interactive audio and video telecommunications were attempted between this nurse and patient, however failed, due to patient having technical difficulties OR patient did not have access to video capability.  We continued and completed visit with audio only.  Some vital signs may be absent or patient reported.   Candace Lopez E Clark Cuff, LPN   Review of Systems     Cardiac Risk Factors include: advanced age (>37mn, >>47women);diabetes mellitus;dyslipidemia;hypertension;Other (see comment), Risk factor comments: OSA - mild - no CPAP     Objective:    Today's Vitals   09/09/21 1329  Weight: 165 lb (74.8 kg)   Body mass index is 27.46 kg/m.     09/09/2021    1:38 PM 09/06/2020    2:35 PM 09/22/2018    9:28 AM  Advanced Directives  Does Patient Have a Medical Advance Directive? No No No  Would patient like information on creating a medical advance directive? No - Patient declined No - Patient declined No - Patient declined    Current Medications (verified) Outpatient Encounter Medications as of 09/09/2021  Medication Sig   Alpha-Lipoic Acid 600 MG CAPS Take 1 capsule (600 mg total) by mouth daily. For diabetic neuropathy   aspirin EC 81 MG tablet Take 81 mg by mouth in the morning and at bedtime. Swallow whole.   atorvastatin (LIPITOR)  40 MG tablet TAKE 1 TABLET BY MOUTH EVERYDAY AT BEDTIME   Blood Glucose Monitoring Suppl (ONE TOUCH ULTRA 2) w/Device KIT UAD to check BGs E11.9   lisinopril (ZESTRIL) 10 MG tablet TAKE 1 TABLET (10 MG TOTAL) BY MOUTH IN THE MORNING AND AT BEDTIME.   metFORMIN (GLUCOPHAGE-XR) 500 MG 24 hr tablet TAKE 2 TABLETS BY MOUTH EVERY DAY WITH BREAKFAST   metoprolol tartrate (LOPRESSOR) 25 MG tablet Take 1 tablet (25 mg total) by mouth 2 (two) times daily.   OneTouch Delica Lancets 393XMISC Check BS BID Dx E11.40   ONETOUCH ULTRA test strip CHECK BLOOD SUGAR TWICE DAILY DX 11.40   pramipexole (MIRAPEX) 0.25 MG tablet Take 1 tablet 2 hours before bedtime for restless legs   saxagliptin HCl (ONGLYZA) 5 MG TABS tablet Take 1 tablet (5 mg total) by mouth daily.   TURMERIC PO Take 1 capsule by mouth 2 (two) times daily.    zolpidem (AMBIEN) 5 MG tablet take 1 tablet(5 mg total) by mouth at bedtime as needed for sleep. Put on hoild   No facility-administered encounter medications on file as of 09/09/2021.    Allergies (verified) Tape   History: Past Medical History:  Diagnosis Date   DDD (degenerative disc disease), lumbar    Diabetes mellitus without complication (HCC)    GERD (gastroesophageal reflux disease)    Hyperlipidemia    Hypertension    Urge incontinence of urine    Past Surgical History:  Procedure Laterality Date   ABDOMINAL HYSTERECTOMY  arm surgery     COLONOSCOPY N/A 09/22/2018   Procedure: COLONOSCOPY;  Surgeon: Rogene Houston, MD;  Location: [facility];  Service: Endoscopy;  Laterality: N/A;  9   FOOT SURGERY     KNEE SURGERY     Family History  Problem Relation Age of Onset   Cancer Mother        lung cancer   Cancer Father        lymphnodes   Diabetes Sister    Diabetes Brother    Diabetes Son    Cancer Maternal Aunt        throat   Cancer Paternal Uncle        unknown    Cancer Maternal Grandfather    Heart disease Paternal Grandfather    Cancer Other         breast   Colon cancer Neg Hx    Social History   Socioeconomic History   Marital status: Married    Spouse name: Jimmy   Number of children: 2   Years of education: Not on file   Highest education level: Not on file  Occupational History   Occupation: retired  Tobacco Use   Smoking status: Never   Smokeless tobacco: Never  Vaping Use   Vaping Use: Never used  Substance and Sexual Activity   Alcohol use: No   Drug use: No   Sexual activity: Not on file  Other Topics Concern   Not on file  Social History Narrative   2 children  - son lives in [place], daughter 15 minutes away.   09/09/21 - She is having to stay home more to take care of husband who is not in good health   Social Determinants of Health   Financial Resource Strain: Low Risk  (09/09/2021)   Overall Financial Resource Strain (CARDIA)    Difficulty of Paying Living Expenses: Not hard at all  Food Insecurity: No Food Insecurity (09/09/2021)   Hunger Vital Sign    Worried About Running Out of Food in the Last Year: Never true    [place] in the Last Year: Never true  Transportation Needs: No Transportation Needs (09/09/2021)   PRAPARE - Hydrologist (Medical): No    Lack of Transportation (Non-Medical): No  Physical Activity: Insufficiently Active (09/09/2021)   Exercise Vital Sign    Days of Exercise per Week: 7 days    Minutes of Exercise per Session: 20 min  Stress: No Stress Concern Present (09/09/2021)   [place]    Feeling of Stress : Not at all  Social Connections: Moderately Isolated (09/09/2021)   Social Connection and Isolation Panel [NHANES]    Frequency of Communication with Friends and Family: More than three times a week    Frequency of Social Gatherings with Friends and Family: More than three times a week    Attends Religious Services: Never    Marine scientist or Organizations:  No    Attends Music therapist: Never    Marital Status: Married    Tobacco Counseling Counseling given: Not Answered   Clinical Intake:  Pre-visit preparation completed: Yes  Pain : No/denies pain     BMI - recorded: 27.46 Nutritional Status: BMI 25 -29 Overweight Nutritional Risks: None Diabetes: Yes CBG done?: No Did pt. bring in CBG monitor from home?: No  How often do you need to have someone help  you when you read instructions, pamphlets, or other written materials from your doctor or pharmacy?: 1 - Never  Diabetic? Nutrition Risk Assessment:  Has the patient had any N/V/D within the last 2 months?  No  Does the patient have any non-healing wounds?  No  Has the patient had any unintentional weight loss or weight gain?  No   Diabetes:  Is the patient diabetic?  Yes  If diabetic, was a CBG obtained today?  No  Did the patient bring in their glucometer from home?  No  How often do you monitor your CBG's? Once daily fasting - 150-200 usually per patient.   Financial Strains and Diabetes Management:  Are you having any financial strains with the device, your supplies or your medication? No .  Does the patient want to be seen by Chronic Care Management for management of their diabetes?  No  Would the patient like to be referred to a Nutritionist or for Diabetic Management?  No   Diabetic Exams:  Diabetic Eye Exam: Completed 12/14/2020.   Diabetic Foot Exam: Completed 11/27/2020. Pt has been advised about the importance in completing this exam. Pt is scheduled for diabetic foot exam on next appt.    Interpreter Needed?: No  Information entered by :: Ashwath Lasch, LPN   Activities of Daily Living    09/09/2021    1:38 PM  In your present state of health, do you have any difficulty performing the following activities:  Hearing? 0  Vision? 0  Difficulty concentrating or making decisions? 0  Walking or climbing stairs? 0  Dressing or bathing? 0   Doing errands, shopping? 0  Preparing Food and eating ? N  Using the Toilet? N  In the past six months, have you accidently leaked urine? Y  Do you have problems with loss of bowel control? N  Managing your Medications? N  Managing your Finances? N  Housekeeping or managing your Housekeeping? N    Patient Care Team: Janora Norlander, DO as PCP - General (Family Medicine) Harlen Labs, MD as Referring Physician (Optometry) Blanca Friend Royce Macadamia, [facility] as Pharmacist (Family Medicine)  Indicate any recent Medical Services you may have received from other than [facility] providers in the past year (date may be approximate).     Assessment:   This is a routine wellness examination for Arryana.  Hearing/Vision screen Hearing Screening - Comments:: Denies hearing difficulties   Vision Screening - Comments:: Wears rx glasses - up to date with routine eye exams with [facility]  Dietary issues and exercise activities discussed: Current Exercise Habits: Home exercise routine, Type of exercise: walking, Time (Minutes): 20, Frequency (Times/Week): 7, Weekly Exercise (Minutes/Week): 140, Intensity: Mild, Exercise limited by: None identified   Goals Addressed             This Visit's Progress    Exercise 3x per week (30 min per time)   On track    09/09/21 Get Back to working out regularly, get out more, get sugar under control, lose a little more weight       Depression Screen    09/09/2021    1:39 PM 07/01/2021   10:52 AM 06/03/2021    4:11 PM 04/23/2021    9:37 AM 03/27/2021    9:28 AM 11/27/2020   11:01 AM 09/06/2020    2:25 PM  PHQ 2/9 Scores  PHQ - 2 Score 0 0 0 0 0 0 0  PHQ- 9 Score  0 2 3  5 4    Fall Risk    09/09/2021    1:35 PM 07/01/2021   10:52 AM 06/03/2021    4:11 PM 04/23/2021    9:38 AM 03/27/2021    9:28 AM  Fall Risk   Falls in the past year? 1 0 0 0 0  Number falls in past yr: 0      Injury with Fall? 0      Risk for fall due to : Orthopedic patient;History of  fall(s)      Follow up Falls prevention discussed;Education provided        FALL RISK PREVENTION PERTAINING TO THE HOME:  Any stairs in or around the home? Yes  If so, are there any without handrails? No  Home free of loose throw rugs in walkways, pet beds, electrical cords, etc? Yes  Adequate lighting in your home to reduce risk of falls? Yes   ASSISTIVE DEVICES UTILIZED TO PREVENT FALLS:  Life alert? No  Use of a cane, walker or w/c? No  Grab bars in the bathroom? No  Shower chair or bench in shower? No  Elevated toilet seat or a handicapped toilet? No   TIMED UP AND GO:  Was the test performed? No . Telephonic visit  Cognitive Function:    08/26/2017    2:07 PM  MMSE - Mini Mental State Exam  Orientation to time 5  Orientation to Place 5  Registration 3  Attention/ Calculation 5  Recall 2  Language- name 2 objects 2  Language- repeat 1  Language- follow 3 step command 3  Language- read & follow direction 1  Write a sentence 1  Copy design 1  Total score 29        09/09/2021    1:42 PM 09/06/2020    2:29 PM  6CIT Screen  What Year? 0 points 0 points  What month? 0 points 0 points  What time? 0 points 0 points  Count back from 20 0 points 0 points  Months in reverse 0 points 2 points  Repeat phrase 2 points 4 points  Total Score 2 points 6 points    Immunizations Immunization History  Administered Date(s) Administered   Fluad Quad(high Dose 65+) 10/07/2018, 10/31/2019, 11/05/2020   Influenza, High Dose Seasonal PF 12/29/2016, 10/21/2017   Moderna Covid-19 Vaccine Bivalent Booster 25yr & up 03/07/2021   Moderna Sars-Covid-2 Vaccination 04/26/2019, 05/24/2019, 01/24/2020   Pneumococcal Conjugate-13 08/26/2017   Pneumococcal Polysaccharide-23 12/13/2019   Tdap 07/16/2012    TDAP status: Up to date  Flu Vaccine status: Up to date  Pneumococcal vaccine status: Up to date  Covid-19 vaccine status: Completed vaccines  Qualifies for Shingles Vaccine?  Yes   Zostavax completed Yes   Shingrix Completed?: No.    Education has been provided regarding the importance of this vaccine. Patient has been advised to call insurance company to determine out of pocket expense if they have not yet received this vaccine. Advised may also receive vaccine at local pharmacy or Health Dept. Verbalized acceptance and understanding.  Screening Tests Health Maintenance  Topic Date Due   COVID-19 Vaccine (5 - Moderna risk series) 05/02/2021   INFLUENZA VACCINE  08/27/2021   Zoster Vaccines- Shingrix (1 of 2) 10/01/2021 (Originally 09/01/1969)   FOOT EXAM  11/27/2021   OPHTHALMOLOGY EXAM  12/14/2021   DEXA SCAN  12/19/2021   HEMOGLOBIN A1C  12/31/2021   TETANUS/TDAP  07/17/2022   MAMMOGRAM  10/10/2022   COLONOSCOPY (Pts 45-424yrInsurance coverage will  need to be confirmed)  09/21/2028   Pneumonia Vaccine 18+ Years old  Completed   Hepatitis C Screening  Completed   HPV VACCINES  Aged Out    Health Maintenance  Health Maintenance Due  Topic Date Due   COVID-19 Vaccine (5 - Moderna risk series) 05/02/2021   INFLUENZA VACCINE  08/27/2021    Colorectal cancer screening: Type of screening: Colonoscopy. Completed 09/22/2018. Repeat every 10 years  Mammogram status: Completed 10/09/2020. Repeat every year  Bone Density status: Completed 12/20/2019. Results reflect: Bone density results: OSTEOPENIA. Repeat every 2 years.  Lung Cancer Screening: (Low Dose CT Chest recommended if Age 14-80 years, 30 pack-year currently smoking OR have quit w/in 15years.) does not qualify.  Additional Screening:  Hepatitis C Screening: does not qualify; Completed 10/07/2018  Vision Screening: Recommended annual ophthalmology exams for early detection of glaucoma and other disorders of the eye. Is the patient up to date with their annual eye exam?  Yes  Who is the provider or what is the name of the office in which the patient attends annual eye exams? [facility] If pt  is not established with a provider, would they like to be referred to a provider to establish care? No .   Dental Screening: Recommended annual dental exams for proper oral hygiene  Community Resource Referral / Chronic Care Management: CRR required this visit?  No   CCM required this visit?  No      Plan:     I have personally reviewed and noted the following in the patient's chart:   Medical and social history Use of alcohol, tobacco or illicit drugs  Current medications and supplements including opioid prescriptions.  Functional ability and status Nutritional status Physical activity Advanced directives List of other physicians Hospitalizations, surgeries, and ER visits in previous 12 months Vitals Screenings to include cognitive, depression, and falls Referrals and appointments  In addition, I have reviewed and discussed with patient certain preventive protocols, quality metrics, and best practice recommendations. A written personalized care plan for preventive services as well as general preventive health recommendations were provided to patient.     Sandrea Hammond, LPN   0/05/1100   Nurse Notes: None

## 2021-09-09 NOTE — Patient Instructions (Signed)
Ms. Candace Lopez , Thank you for taking time to come for your Medicare Wellness Visit. I appreciate your ongoing commitment to your health goals. Please review the following plan we discussed and let me know if I can assist you in the future.   Screening recommendations/referrals: Colonoscopy: Done 09/22/2018 - Repeat in 10 years  Mammogram: Done 10/09/2020 - Repeat annually *appointment 10/11/2021 @ 9:15 Bone Density: Done 12/20/2019 - Repeat every 2 years  Recommended yearly ophthalmology/optometry visit for glaucoma screening and checkup Recommended yearly dental visit for hygiene and checkup  Vaccinations: Influenza vaccine: Done 11/05/2020 - Repeat annually  Pneumococcal vaccine: Done 08/26/2017 & 12/13/2019 Tdap vaccine: Done 07/16/2012 - Repeat in 10 years  Shingles vaccine: Declined - Shingrix is 2 doses 2-6 months apart and over 90% effective     Covid-19: Done 04/26/2019, 05/24/2019, 01/24/2020, & 03/07/2021     Advanced directives: Advance directive discussed with you today. Even though you declined this today, please call our office should you change your mind, and we can give you the proper paperwork for you to fill out.   Conditions/risks identified: Aim for 30 minutes of exercise or brisk walking, 6-8 glasses of water, and 5 servings of fruits and vegetables each day.   Next appointment: Follow up in one year for your annual wellness visit    Preventive Care 65 Years and Older, Female Preventive care refers to lifestyle choices and visits with your health care provider that can promote health and wellness. What does preventive care include? A yearly physical exam. This is also called an annual well check. Dental exams once or twice a year. Routine eye exams. Ask your health care provider how often you should have your eyes checked. Personal lifestyle choices, including: Daily care of your teeth and gums. Regular physical activity. Eating a healthy diet. Avoiding tobacco and drug  use. Limiting alcohol use. Practicing safe sex. Taking low-dose aspirin every day. Taking vitamin and mineral supplements as recommended by your health care provider. What happens during an annual well check? The services and screenings done by your health care provider during your annual well check will depend on your age, overall health, lifestyle risk factors, and family history of disease. Counseling  Your health care provider may ask you questions about your: Alcohol use. Tobacco use. Drug use. Emotional well-being. Home and relationship well-being. Sexual activity. Eating habits. History of falls. Memory and ability to understand (cognition). Work and work Astronomer. Reproductive health. Screening  You may have the following tests or measurements: Height, weight, and BMI. Blood pressure. Lipid and cholesterol levels. These may be checked every 5 years, or more frequently if you are over 59 years old. Skin check. Lung cancer screening. You may have this screening every year starting at age 38 if you have a 30-pack-year history of smoking and currently smoke or have quit within the past 15 years. Fecal occult blood test (FOBT) of the stool. You may have this test every year starting at age 68. Flexible sigmoidoscopy or colonoscopy. You may have a sigmoidoscopy every 5 years or a colonoscopy every 10 years starting at age 8. Hepatitis C blood test. Hepatitis B blood test. Sexually transmitted disease (STD) testing. Diabetes screening. This is done by checking your blood sugar (glucose) after you have not eaten for a while (fasting). You may have this done every 1-3 years. Bone density scan. This is done to screen for osteoporosis. You may have this done starting at age 31. Mammogram. This may be done every  1-2 years. Talk to your health care provider about how often you should have regular mammograms. Talk with your health care provider about your test results, treatment  options, and if necessary, the need for more tests. Vaccines  Your health care provider may recommend certain vaccines, such as: Influenza vaccine. This is recommended every year. Tetanus, diphtheria, and acellular pertussis (Tdap, Td) vaccine. You may need a Td booster every 10 years. Zoster vaccine. You may need this after age 94. Pneumococcal 13-valent conjugate (PCV13) vaccine. One dose is recommended after age 18. Pneumococcal polysaccharide (PPSV23) vaccine. One dose is recommended after age 61. Talk to your health care provider about which screenings and vaccines you need and how often you need them. This information is not intended to replace advice given to you by your health care provider. Make sure you discuss any questions you have with your health care provider. Document Released: 02/09/2015 Document Revised: 10/03/2015 Document Reviewed: 11/14/2014 Elsevier Interactive Patient Education  2017 [employer].  Fall Prevention in the Home Falls can cause injuries. They can happen to people of all ages. There are many things you can do to make your home safe and to help prevent falls. What can I do on the outside of my home? Regularly fix the edges of walkways and driveways and fix any cracks. Remove anything that might make you trip as you walk through a door, such as a raised step or threshold. Trim any bushes or trees on the path to your home. Use bright outdoor lighting. Clear any walking paths of anything that might make someone trip, such as rocks or tools. Regularly check to see if handrails are loose or broken. Make sure that both sides of any steps have handrails. Any raised decks and porches should have guardrails on the edges. Have any leaves, snow, or ice cleared regularly. Use sand or salt on walking paths during winter. Clean up any spills in your garage right away. This includes oil or grease spills. What can I do in the bathroom? Use night lights. Install grab  bars by the toilet and in the tub and shower. Do not use towel bars as grab bars. Use non-skid mats or decals in the tub or shower. If you need to sit down in the shower, use a plastic, non-slip stool. Keep the floor dry. Clean up any water that spills on the floor as soon as it happens. Remove soap buildup in the tub or shower regularly. Attach bath mats securely with double-sided non-slip rug tape. Do not have throw rugs and other things on the floor that can make you trip. What can I do in the bedroom? Use night lights. Make sure that you have a light by your bed that is easy to reach. Do not use any sheets or blankets that are too big for your bed. They should not hang down onto the floor. Have a firm chair that has side arms. You can use this for support while you get dressed. Do not have throw rugs and other things on the floor that can make you trip. What can I do in the kitchen? Clean up any spills right away. Avoid walking on wet floors. Keep items that you use a lot in easy-to-reach places. If you need to reach something above you, use a strong step stool that has a grab bar. Keep electrical cords out of the way. Do not use floor polish or wax that makes floors slippery. If you must use wax, use non-skid  floor wax. Do not have throw rugs and other things on the floor that can make you trip. What can I do with my stairs? Do not leave any items on the stairs. Make sure that there are handrails on both sides of the stairs and use them. Fix handrails that are broken or loose. Make sure that handrails are as long as the stairways. Check any carpeting to make sure that it is firmly attached to the stairs. Fix any carpet that is loose or worn. Avoid having throw rugs at the top or bottom of the stairs. If you do have throw rugs, attach them to the floor with carpet tape. Make sure that you have a light switch at the top of the stairs and the bottom of the stairs. If you do not have them,  ask someone to add them for you. What else can I do to help prevent falls? Wear shoes that: Do not have high heels. Have rubber bottoms. Are comfortable and fit you well. Are closed at the toe. Do not wear sandals. If you use a stepladder: Make sure that it is fully opened. Do not climb a closed stepladder. Make sure that both sides of the stepladder are locked into place. Ask someone to hold it for you, if possible. Clearly mark and make sure that you can see: Any grab bars or handrails. First and last steps. Where the edge of each step is. Use tools that help you move around (mobility aids) if they are needed. These include: Canes. Walkers. Scooters. Crutches. Turn on the lights when you go into a dark area. Replace any light bulbs as soon as they burn out. Set up your furniture so you have a clear path. Avoid moving your furniture around. If any of your floors are uneven, fix them. If there are any pets around you, be aware of where they are. Review your medicines with your doctor. Some medicines can make you feel dizzy. This can increase your chance of falling. Ask your doctor what other things that you can do to help prevent falls. This information is not intended to replace advice given to you by your health care provider. Make sure you discuss any questions you have with your health care provider. Document Released: 11/09/2008 Document Revised: 06/21/2015 Document Reviewed: 02/17/2014 Elsevier Interactive Patient Education  2017 [employer].

## 2021-09-25 DIAGNOSIS — Z7982 Long term (current) use of aspirin: Secondary | ICD-10-CM | POA: Diagnosis not present

## 2021-09-25 DIAGNOSIS — E785 Hyperlipidemia, unspecified: Secondary | ICD-10-CM | POA: Diagnosis not present

## 2021-09-25 DIAGNOSIS — K5900 Constipation, unspecified: Secondary | ICD-10-CM | POA: Diagnosis not present

## 2021-09-25 DIAGNOSIS — M1990 Unspecified osteoarthritis, unspecified site: Secondary | ICD-10-CM | POA: Diagnosis not present

## 2021-09-25 DIAGNOSIS — E119 Type 2 diabetes mellitus without complications: Secondary | ICD-10-CM | POA: Diagnosis not present

## 2021-09-25 DIAGNOSIS — G4700 Insomnia, unspecified: Secondary | ICD-10-CM | POA: Diagnosis not present

## 2021-09-25 DIAGNOSIS — I499 Cardiac arrhythmia, unspecified: Secondary | ICD-10-CM | POA: Diagnosis not present

## 2021-09-25 DIAGNOSIS — G2581 Restless legs syndrome: Secondary | ICD-10-CM | POA: Diagnosis not present

## 2021-09-25 DIAGNOSIS — I10 Essential (primary) hypertension: Secondary | ICD-10-CM | POA: Diagnosis not present

## 2021-09-25 DIAGNOSIS — K219 Gastro-esophageal reflux disease without esophagitis: Secondary | ICD-10-CM | POA: Diagnosis not present

## 2021-09-25 DIAGNOSIS — Z7984 Long term (current) use of oral hypoglycemic drugs: Secondary | ICD-10-CM | POA: Diagnosis not present

## 2021-09-25 DIAGNOSIS — Z809 Family history of malignant neoplasm, unspecified: Secondary | ICD-10-CM | POA: Diagnosis not present

## 2021-09-27 ENCOUNTER — Encounter: Payer: Medicare HMO | Admitting: Family Medicine

## 2021-09-30 ENCOUNTER — Other Ambulatory Visit: Payer: Self-pay | Admitting: Family Medicine

## 2021-09-30 DIAGNOSIS — F5101 Primary insomnia: Secondary | ICD-10-CM

## 2021-10-10 ENCOUNTER — Other Ambulatory Visit: Payer: Self-pay | Admitting: Family Medicine

## 2021-10-10 DIAGNOSIS — E785 Hyperlipidemia, unspecified: Secondary | ICD-10-CM

## 2021-10-11 ENCOUNTER — Ambulatory Visit (HOSPITAL_COMMUNITY)
Admission: RE | Admit: 2021-10-11 | Discharge: 2021-10-11 | Disposition: A | Discharge status: Home / Self Care | Payer: Medicare HMO | Source: Ambulatory Visit | Attending: Family Medicine | Admitting: Family Medicine

## 2021-10-11 DIAGNOSIS — Z1231 Encounter for screening mammogram for malignant neoplasm of breast: Secondary | ICD-10-CM | POA: Insufficient documentation

## 2021-10-28 ENCOUNTER — Encounter: Payer: Self-pay | Admitting: Family Medicine

## 2021-10-28 ENCOUNTER — Ambulatory Visit (INDEPENDENT_AMBULATORY_CARE_PROVIDER_SITE_OTHER): Payer: Medicare HMO | Admitting: Family Medicine

## 2021-10-28 DIAGNOSIS — U071 COVID-19: Secondary | ICD-10-CM

## 2021-10-28 MED ORDER — BENZONATATE 100 MG PO CAPS: 100.0000 mg | ORAL_CAPSULE | Freq: Three times a day (TID) | ORAL | 0 refills | Status: DC | PRN

## 2021-10-28 MED ORDER — MOLNUPIRAVIR EUA 200MG CAPSULE: 4.0000 | ORAL_CAPSULE | Freq: Two times a day (BID) | ORAL | 0 refills | Status: AC

## 2021-10-28 MED ORDER — GUAIFENESIN-CODEINE 100-10 MG/5ML PO SOLN: 5.0000 mL | Freq: Three times a day (TID) | ORAL | 0 refills | Status: DC | PRN

## 2021-10-28 NOTE — Progress Notes (Signed)
Telephone visit  Subjective: Candace Lopez PCP: Candace Norlander, DO OVZ:CHYIFO [name] is a 71 y.o. female calls for telephone consult today. Patient provides verbal consent for consult held via phone.  Due to COVID-19 pandemic this visit was conducted virtually. This visit type was conducted due to national recommendations for restrictions regarding the COVID-19 Pandemic (e.g. social distancing, sheltering in place) in an effort to limit this patient's exposure and mitigate transmission in our community. All issues noted in this document were discussed and addressed.  A physical exam was not performed with this format.   Location of patient: home Location of provider: [facility] Others present for call: none  1. COVID-19 She reports that she slid onto the floor from weakness yesterday and took a COVID test and it was positive x2.  She reports harsh coughing, rhinorrhea.  No fevers, shortness of breath.  She reports dry heaves, no vomiting or diarrhea.  No known sick contacts.  She had Amoxicillin for the tooth.   ROS: Per HPI  Allergies  Allergen Reactions   Tape Rash   Past Medical History:  Diagnosis Date   DDD (degenerative disc disease), lumbar    Diabetes mellitus without complication (HCC)    GERD (gastroesophageal reflux disease)    Hyperlipidemia    Hypertension    Urge incontinence of urine     Current Outpatient Medications:    Alpha-Lipoic Acid 600 MG CAPS, Take 1 capsule (600 mg total) by mouth daily. For diabetic neuropathy, Disp: 90 capsule, Rfl: 3   aspirin EC 81 MG tablet, Take 81 mg by mouth in the morning and at bedtime. Swallow whole., Disp: , Rfl:    atorvastatin (LIPITOR) 40 MG tablet, TAKE 1 TABLET BY MOUTH EVERYDAY AT BEDTIME, Disp: 90 tablet, Rfl: 3   Blood Glucose Monitoring Suppl (ONE TOUCH ULTRA 2) w/Device KIT, UAD to check BGs E11.9, Disp: 1 kit, Rfl: 0   lisinopril (ZESTRIL) 10 MG tablet, TAKE 1 TABLET (10 MG TOTAL) BY MOUTH IN THE MORNING AND AT  BEDTIME., Disp: 180 tablet, Rfl: 3   metFORMIN (GLUCOPHAGE-XR) 500 MG 24 hr tablet, TAKE 2 TABLETS BY MOUTH EVERY DAY WITH BREAKFAST, Disp: 180 tablet, Rfl: 0   metoprolol tartrate (LOPRESSOR) 25 MG tablet, Take 1 tablet (25 mg total) by mouth 2 (two) times daily., Disp: 180 tablet, Rfl: 3   OneTouch Delica Lancets 27X MISC, Check BS BID Dx E11.40, Disp: 200 each, Rfl: 3   ONETOUCH ULTRA test strip, CHECK BLOOD SUGAR TWICE DAILY DX 11.40, Disp: 200 strip, Rfl: 4   pramipexole (MIRAPEX) 0.25 MG tablet, Take 1 tablet 2 hours before bedtime for restless legs, Disp: 90 tablet, Rfl: 3   saxagliptin HCl (ONGLYZA) 5 MG TABS tablet, Take 1 tablet (5 mg total) by mouth daily., Disp: 90 tablet, Rfl: 3   TURMERIC PO, Take 1 capsule by mouth 2 (two) times daily. , Disp: , Rfl:    zolpidem (AMBIEN) 5 MG tablet, take 1 tablet(5 mg total) by mouth at bedtime as needed for sleep. Put on hoild, Disp: 30 tablet, Rfl: 5  Assessment/ Plan: 71 y.o. female   COVID-19 - Plan: molnupiravir EUA (LAGEVRIO) 200 mg CAPS capsule, benzonatate (TESSALON PERLES) 100 MG capsule, guaiFENesin-codeine 100-10 MG/5ML syrup  Molnupiravir sent.  Home care instructions reviewed. Reasons for reevaluation discussed. Hydrate.  Cough suppressant sent.  Caution sedation with codeine.  National narcotic database reviewed and there were no red flags.  Advised against use of any Tylenol 3 with this medication  Start time: 9:52am End time: 9:58am  Total time spent on patient care (including telephone call/ virtual visit): 6 minutes  [place], [place] [phone]

## 2021-10-30 ENCOUNTER — Other Ambulatory Visit: Payer: Self-pay | Admitting: Family Medicine

## 2021-10-30 DIAGNOSIS — E1140 Type 2 diabetes mellitus with diabetic neuropathy, unspecified: Secondary | ICD-10-CM

## 2021-11-01 ENCOUNTER — Other Ambulatory Visit: Payer: Self-pay | Admitting: Family Medicine

## 2021-11-01 DIAGNOSIS — I152 Hypertension secondary to endocrine disorders: Secondary | ICD-10-CM

## 2021-12-03 ENCOUNTER — Ambulatory Visit (INDEPENDENT_AMBULATORY_CARE_PROVIDER_SITE_OTHER): Payer: Medicare HMO | Admitting: *Deleted

## 2021-12-03 DIAGNOSIS — Z23 Encounter for immunization: Secondary | ICD-10-CM | POA: Diagnosis not present

## 2021-12-06 ENCOUNTER — Ambulatory Visit
Admission: EM | Admit: 2021-12-06 | Discharge: 2021-12-06 | Disposition: A | Discharge status: Home / Self Care | Payer: Medicare HMO | Attending: Nurse Practitioner | Admitting: Nurse Practitioner

## 2021-12-06 ENCOUNTER — Telehealth: Payer: Self-pay | Admitting: Family Medicine

## 2021-12-06 ENCOUNTER — Ambulatory Visit (INDEPENDENT_AMBULATORY_CARE_PROVIDER_SITE_OTHER): Payer: Medicare HMO

## 2021-12-06 DIAGNOSIS — M79671 Pain in right foot: Secondary | ICD-10-CM | POA: Diagnosis not present

## 2021-12-06 DIAGNOSIS — R600 Localized edema: Secondary | ICD-10-CM | POA: Diagnosis not present

## 2021-12-06 DIAGNOSIS — W19XXXA Unspecified fall, initial encounter: Secondary | ICD-10-CM | POA: Diagnosis not present

## 2021-12-06 DIAGNOSIS — S93491A Sprain of other ligament of right ankle, initial encounter: Secondary | ICD-10-CM | POA: Diagnosis not present

## 2021-12-06 NOTE — ED Provider Notes (Signed)
[facility]    CSN: [id number] Arrival date & time: 12/06/21  1335      History   Chief Complaint Chief Complaint  Patient presents with   Foot Injury    HPI Candace EMBRY is a 71 y.o. female.   Patient presents for right foot pain that began last night after a fall while changing her furnace filter.  Reports she fell backward, did not lose consciousness.  Her foot folded under her body.  Pain began immediately.   She endorses pain when flexing her foot.  No significant weakness with weightbearing or walking.  No redness or numbness/tingling going on the foot.  She does endorse bruising and swelling around the foot and ankle.  No ankle pain.  She has tried warm soaks, ice, and ibuprofen which has helped with the pain.    Past Medical History:  Diagnosis Date   DDD (degenerative disc disease), lumbar    Diabetes mellitus without complication ([place])    GERD (gastroesophageal reflux disease)    Hyperlipidemia    Hypertension    Urge incontinence of urine     Patient Active Problem List   Diagnosis Date Noted   Mild obesity 09/06/2020   Special screening for malignant neoplasms, colon 02/03/2018   Degenerative disk disease 06/30/2017   Mild obstructive sleep apnea 06/30/2017   Type 2 diabetes mellitus with diabetic neuropathy, unspecified ([place]) 03/14/2016   Hypertension associated with diabetes ([place]) 03/14/2016   Hyperlipidemia associated with type 2 diabetes mellitus ([place]) 03/14/2016   Restless leg syndrome, familial, uncontrolled 03/14/2016   Chest pain 05/06/2012   Health care maintenance 05/04/2012   Urge incontinence 06/28/2011   Left knee pain 01/23/2010    Past Surgical History:  Procedure Laterality Date   ABDOMINAL HYSTERECTOMY     arm surgery     COLONOSCOPY N/A 09/22/2018   Procedure: COLONOSCOPY;  Surgeon: Rogene Houston, MD;  Location: [facility];  Service: Endoscopy;  Laterality: N/A;  830   FOOT SURGERY     KNEE SURGERY      OB  History   No obstetric history on file.      Home Medications    Prior to Admission medications   Medication Sig Start Date End Date Taking? Authorizing Provider  Alpha-Lipoic Acid 600 MG CAPS Take 1 capsule (600 mg total) by mouth daily. For diabetic neuropathy 08/28/20   Janora Norlander, DO  aspirin EC 81 MG tablet Take 81 mg by mouth in the morning and at bedtime. Swallow whole.    [provider]  atorvastatin (LIPITOR) 40 MG tablet TAKE 1 TABLET BY MOUTH EVERYDAY AT BEDTIME 03/27/21   Gottschalk, Ashly M, DO  benzonatate (TESSALON PERLES) 100 MG capsule Take 1 capsule (100 mg total) by mouth 3 (three) times daily as needed for cough. 10/28/21   Janora Norlander, DO  Blood Glucose Monitoring Suppl (ONE TOUCH ULTRA 2) w/Device KIT UAD to check BGs E11.9 03/27/21   Ronnie Doss M, DO  guaiFENesin-codeine 100-10 MG/5ML syrup Take 5 mLs by mouth 3 (three) times daily as needed for cough. 10/28/21   Ronnie Doss M, DO  lisinopril (ZESTRIL) 10 MG tablet TAKE 1 TABLET (10 MG TOTAL) BY MOUTH IN THE MORNING AND AT BEDTIME. (STOP TAKING LISINOPRIL 20MG) 11/01/21   Gottschalk, Leatrice Jewels M, DO  metFORMIN (GLUCOPHAGE-XR) 500 MG 24 hr tablet TAKE 2 TABLETS BY MOUTH EVERY DAY WITH BREAKFAST 10/30/21   Ronnie Doss M, DO  metoprolol tartrate (LOPRESSOR) 25  MG tablet Take 1 tablet (25 mg total) by mouth 2 (two) times daily. 03/27/21   Janora Norlander, DO  OneTouch Delica Lancets 22W MISC Check BS BID Dx E11.40 03/05/20   Ronnie Doss M, DO  ONETOUCH ULTRA test strip CHECK BLOOD SUGAR TWICE DAILY DX 11.40 03/04/21   Ronnie Doss M, DO  pramipexole (MIRAPEX) 0.25 MG tablet Take 1 tablet 2 hours before bedtime for restless legs 08/16/21   Ronnie Doss M, DO  saxagliptin HCl (ONGLYZA) 5 MG TABS tablet Take 1 tablet (5 mg total) by mouth daily. 06/26/21   Janora Norlander, DO  TURMERIC PO Take 1 capsule by mouth 2 (two) times daily.     [provider]  zolpidem (AMBIEN)  5 MG tablet take 1 tablet(5 mg total) by mouth at bedtime as needed for sleep. Put on hoild 07/01/21   Janora Norlander, DO    Family History Family History  Problem Relation Age of Onset   Cancer Mother        lung cancer   Cancer Father        lymphnodes   Diabetes Sister    Diabetes Brother    Diabetes Son    Cancer Maternal Aunt        throat   Cancer Paternal Uncle        unknown    Cancer Maternal Grandfather    Heart disease Paternal Grandfather    Cancer Other        breast   Colon cancer Neg Hx     Social History Social History   Tobacco Use   Smoking status: Never   Smokeless tobacco: Never  Vaping Use   Vaping Use: Never used  Substance Use Topics   Alcohol use: No   Drug use: No     Allergies   Tape   Review of Systems Review of Systems Per HPI  Physical Exam Triage Vital Signs ED Triage Vitals [12/06/21 1355]  Enc Vitals Group     BP (!) 169/76     Pulse Rate 92     Resp 16     Temp 98.4 F (36.9 C)     Temp Source Oral     SpO2 94 %     Weight      Height      Head Circumference      Peak Flow      Pain Score 4     Pain Loc      Pain Edu?      Excl. in [place]?    No data found.  Updated Vital Signs BP (!) 169/76 (BP Location: Right Arm)   Pulse 92   Temp 98.4 F (36.9 C) (Oral)   Resp 16   SpO2 94%   Visual Acuity Right Eye Distance:   Left Eye Distance:   Bilateral Distance:    Right Eye Near:   Left Eye Near:    Bilateral Near:     Physical Exam Vitals and nursing note reviewed.  Constitutional:      General: She is not in acute distress.    Appearance: Normal appearance. She is not toxic-appearing.  HENT:     Mouth/Throat:     Mouth: Mucous membranes are moist.     Pharynx: Oropharynx is clear.  Pulmonary:     Effort: Pulmonary effort is normal. No respiratory distress.  Musculoskeletal:     Right ankle: Swelling present. No deformity, ecchymosis or lacerations. Tenderness present over  the ATF ligament and  base of 5th metatarsal. Normal range of motion. Normal pulse.     Right Achilles Tendon: Normal. No tenderness or defects. Thompson's test negative.     Right foot: Normal range of motion and normal capillary refill. Swelling, tenderness and bony tenderness present. No laceration. Normal pulse.       Feet:     Comments: Inspection: mild swelling diffusely around the right ankle.  Swelling extends into midfoot dorsally.  No obvious deformity or redness.  Palpation: right ATFL tender to palpation in area marked, otherwise diffusely nontender; no obvious deformities palpated ROM: Full ROM to ankle and flexibility of foot  Strength: 5/5 right lower extremity Neurovascular: neurovascularly intact in right lower extremity    Skin:    General: Skin is warm and dry.     Capillary Refill: Capillary refill takes less than 2 seconds.     Coloration: Skin is not jaundiced or pale.     Findings: No erythema.  Neurological:     Mental Status: She is alert and oriented to person, place, and time.  Psychiatric:        Behavior: Behavior is cooperative.      UC Treatments / Results  Labs (all labs ordered are listed, but only abnormal results are displayed) Labs Reviewed - No data to display  EKG   Radiology DG Foot Complete Right  Result Date: 12/06/2021 CLINICAL DATA:  Right foot pain, fall yesterday, remote history of toe surgery EXAM: RIGHT FOOT COMPLETE - 3+ VIEW COMPARISON:  None Available. FINDINGS: No fracture or dislocation of the right foot. 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 Evidence of prior right first proximal phalangeal osteotomy. Small plantar and Achilles calcaneal spurs. Mild soft tissue edema about the hindfoot and ankle. IMPRESSION: 1. No fracture or dislocation of the right foot. 2. 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 3. Evidence of prior right first  proximal phalangeal osteotomy. 4. Mild soft tissue edema about the hindfoot and ankle. Electronically Signed   By: Delanna Ahmadi M.D.   On: 12/06/2021 14:18    Procedures Procedures (including critical care time)  Medications Ordered in UC Medications - No data to display  Initial Impression / Assessment and Plan / UC Course  I have reviewed the triage vital signs and the nursing notes.  Pertinent labs & imaging results that were available during my care of the patient were reviewed by me and considered in my medical decision making (see chart for details).   Patient is well-appearing, afebrile, not tachycardic, not tachypneic, oxygenating well on room air.  She is mildly hypertensive today without any symptoms, likely secondary to acute pain in the acute musculoskeletal injury.  Sprain of anterior talofibular ligament of right ankle, initial encounter Foot x-ray today is negative for acute bony abnormality Suspect ankle sprain Discussed rest, ice, compression, elevation Ace wrap applied today in urgent care Follow-up with podiatry with no improvement in symptoms after 1 to 2 weeks  The patient was given the opportunity to ask questions.  All questions answered to their satisfaction.  The patient is in agreement to this plan.    Final Clinical Impressions(s) / UC Diagnoses   Final diagnoses:  Sprain of anterior talofibular ligament of right ankle, initial encounter     Discharge Instructions      Foot x-ray today does not show any broken bones or fractures in your right foot.  Please wear  the Ace wrap whenever you are bearing weight on your right foot-this will help provide compression and help with swelling and pain.  You can continue ice, ibuprofen/Tylenol as needed for pain.  Please also elevate your foot when you are sitting down.  Follow-up with podiatry if your symptoms persist more than 1 to 2 weeks without improvement.     ED Prescriptions   None    PDMP not  reviewed this encounter.   Eulogio Bear, NP 12/06/21 1429

## 2021-12-06 NOTE — ED Triage Notes (Signed)
Pt states she fell last night injuring her right foot and ankle.  Swelling to foot and ankle with bruising. Took Motrin with some relief.

## 2021-12-06 NOTE — Telephone Encounter (Signed)
Patient was recommended to go to Urgent care so we can make sure she has not fractured her foot. Patient is in agreement and is going to go to Urgent care.

## 2021-12-06 NOTE — Discharge Instructions (Signed)
Foot x-ray today does not show any broken bones or fractures in your right foot.  Please wear the Ace wrap whenever you are bearing weight on your right foot-this will help provide compression and help with swelling and pain.  You can continue ice, ibuprofen/Tylenol as needed for pain.  Please also elevate your foot when you are sitting down.  Follow-up with podiatry if your symptoms persist more than 1 to 2 weeks without improvement.

## 2021-12-30 ENCOUNTER — Other Ambulatory Visit: Payer: Medicare HMO

## 2021-12-30 ENCOUNTER — Other Ambulatory Visit: Payer: Self-pay

## 2021-12-30 DIAGNOSIS — E1140 Type 2 diabetes mellitus with diabetic neuropathy, unspecified: Secondary | ICD-10-CM

## 2021-12-30 LAB — BAYER DCA HB A1C WAIVED: HB A1C (BAYER DCA - WAIVED): 8.5 % — ABNORMAL HIGH (ref 4.8–5.6)

## 2021-12-31 ENCOUNTER — Other Ambulatory Visit: Payer: Self-pay | Admitting: Family Medicine

## 2021-12-31 DIAGNOSIS — I152 Hypertension secondary to endocrine disorders: Secondary | ICD-10-CM

## 2021-12-31 DIAGNOSIS — E1140 Type 2 diabetes mellitus with diabetic neuropathy, unspecified: Secondary | ICD-10-CM

## 2021-12-31 LAB — CMP14+EGFR
ALT: 18 IU/L (ref 0–32)
AST: 15 IU/L (ref 0–40)
Albumin/Globulin Ratio: 2.1 (ref 1.2–2.2)
Albumin: 4.6 g/dL (ref 3.9–4.9)
Alkaline Phosphatase: 79 IU/L (ref 44–121)
BUN/Creatinine Ratio: 14 (ref 12–28)
BUN: 13 mg/dL (ref 8–27)
Bilirubin Total: 1.1 mg/dL (ref 0.0–1.2)
CO2: 20 mmol/L (ref 20–29)
Calcium: 9.8 mg/dL (ref 8.7–10.3)
Chloride: 100 mmol/L (ref 96–106)
Creatinine, Ser: 0.91 mg/dL (ref 0.57–1.00)
Globulin, Total: 2.2 g/dL (ref 1.5–4.5)
Glucose: 209 mg/dL — ABNORMAL HIGH (ref 70–99)
Potassium: 5.1 mmol/L (ref 3.5–5.2)
Sodium: 134 mmol/L (ref 134–144)
Total Protein: 6.8 g/dL (ref 6.0–8.5)
eGFR: 68 mL/min/{1.73_m2} (ref >59)

## 2021-12-31 LAB — LIPID PANEL
Chol/HDL Ratio: 4.4 ratio (ref 0.0–4.4)
Cholesterol, Total: 129 mg/dL (ref 100–199)
HDL: 29 mg/dL — ABNORMAL LOW (ref >39)
LDL Chol Calc (NIH): 65 mg/dL (ref 0–99)
Triglycerides: 209 mg/dL — ABNORMAL HIGH (ref 0–149)
VLDL Cholesterol Cal: 35 mg/dL (ref 5–40)

## 2022-01-01 ENCOUNTER — Encounter: Payer: Self-pay | Admitting: Family Medicine

## 2022-01-01 ENCOUNTER — Ambulatory Visit (INDEPENDENT_AMBULATORY_CARE_PROVIDER_SITE_OTHER): Payer: Medicare HMO | Admitting: Family Medicine

## 2022-01-01 VITALS — BP 123/81 | HR 62 | Temp 98.2°F | Ht 65.0 in | Wt 164.2 lb

## 2022-01-01 DIAGNOSIS — Z0001 Encounter for general adult medical examination with abnormal findings: Secondary | ICD-10-CM

## 2022-01-01 DIAGNOSIS — Z79899 Other long term (current) drug therapy: Secondary | ICD-10-CM | POA: Diagnosis not present

## 2022-01-01 DIAGNOSIS — F5101 Primary insomnia: Secondary | ICD-10-CM

## 2022-01-01 DIAGNOSIS — E1169 Type 2 diabetes mellitus with other specified complication: Secondary | ICD-10-CM

## 2022-01-01 DIAGNOSIS — R69 Illness, unspecified: Secondary | ICD-10-CM | POA: Diagnosis not present

## 2022-01-01 DIAGNOSIS — Z8616 Personal history of COVID-19: Secondary | ICD-10-CM | POA: Diagnosis not present

## 2022-01-01 DIAGNOSIS — Z79891 Long term (current) use of opiate analgesic: Secondary | ICD-10-CM | POA: Diagnosis not present

## 2022-01-01 DIAGNOSIS — E1159 Type 2 diabetes mellitus with other circulatory complications: Secondary | ICD-10-CM

## 2022-01-01 DIAGNOSIS — I152 Hypertension secondary to endocrine disorders: Secondary | ICD-10-CM | POA: Diagnosis not present

## 2022-01-01 DIAGNOSIS — E1140 Type 2 diabetes mellitus with diabetic neuropathy, unspecified: Secondary | ICD-10-CM | POA: Diagnosis not present

## 2022-01-01 DIAGNOSIS — E785 Hyperlipidemia, unspecified: Secondary | ICD-10-CM | POA: Diagnosis not present

## 2022-01-01 DIAGNOSIS — Z0000 Encounter for general adult medical examination without abnormal findings: Secondary | ICD-10-CM

## 2022-01-01 MED ORDER — ZOLPIDEM TARTRATE 5 MG PO TABS: ORAL_TABLET | ORAL | 5 refills | Status: DC

## 2022-01-01 MED ORDER — GLIPIZIDE ER 5 MG PO TB24: 5.0000 mg | ORAL_TABLET | Freq: Every day | ORAL | 3 refills | Status: DC

## 2022-01-01 NOTE — Progress Notes (Signed)
Candace Lopez is a 71 y.o. female presents to office today for annual physical exam examination.    Concerns today include: 1. Type 2 Diabetes with hypertension, hyperlipidemia:  Admits that she has not been following a strict diet nor exercising regularly.  She had multiple issues since her last visit including infection with COVID-19.  She also notes that her other sister passed away from lung cancer and her body was donated to science.  This apparently was against the wishes of her sister and the family has been pretty torn up about this.  Last eye exam: Up-to-date.  Sees Dr. Jobe Gibbon Last foot exam: Needs Last A1c:  Lab Results  Component Value Date   HGBA1C 8.5 (H) 12/30/2021   Nephropathy screen indicated?:  Needs Last flu, zoster and/or pneumovax:  Immunization History  Administered Date(s) Administered   Fluad Quad(high Dose 65+) 10/07/2018, 10/31/2019, 11/05/2020, 12/03/2021   Influenza, High Dose Seasonal PF 12/29/2016, 10/21/2017   Moderna Covid-19 Vaccine Bivalent Booster 54yr & up 03/07/2021   Moderna Sars-Covid-2 Vaccination 04/26/2019, 05/24/2019, 01/24/2020   Pneumococcal Conjugate-13 08/26/2017   Pneumococcal Polysaccharide-23 12/13/2019   Tdap 07/16/2012    ROS: No chest pain, shortness of breath, visual disturbance.  2.  Insomnia Patient reports insomnia remains controlled with Ambien.  She denies any excessive daytime sedation.  No sleepwalking.  She did sustain a mechanical fall when she was standing on a stool trying to change a light bulb and then the light bulb came loose and she fell backwards.  This resulted in contusions but no fractures.  Occupation: retired, Marital status: married, Substance use: none Diet: not restricted currently, Exercise: [place] Last dental exam: UTD Last colonoscopy: UTD Last mammogram: UTD Last pap smear: n/a Refills needed today: ambien Immunizations needed:declines  shingles. Getting COVID booster in feb. Immunization History  Administered Date(s) Administered   Fluad Quad(high Dose 65+) 10/07/2018, 10/31/2019, 11/05/2020, 12/03/2021   Influenza, High Dose Seasonal PF 12/29/2016, 10/21/2017   Moderna Covid-19 Vaccine Bivalent Booster 163yr& up 03/07/2021   Moderna Sars-Covid-2 Vaccination 04/26/2019, 05/24/2019, 01/24/2020   Pneumococcal Conjugate-13 08/26/2017   Pneumococcal Polysaccharide-23 12/13/2019   Tdap 07/16/2012     Past Medical History:  Diagnosis Date   DDD (degenerative disc disease), lumbar    Diabetes mellitus without complication (HCC)    GERD (gastroesophageal reflux disease)    Hyperlipidemia    Hypertension    Urge incontinence of urine    Social History   Socioeconomic History   Marital status: Married    Spouse name: Jimmy   Number of children: 2   Years of education: Not on file   Highest education level: Not on file  Occupational History   Occupation: retired  Tobacco Use   Smoking status: Never   Smokeless tobacco: Never  Vaping Use   Vaping Use: Never used  Substance and Sexual Activity   Alcohol use: No   Drug use: No   Sexual activity: Not on file  Other Topics Concern   Not on file  Social History Narrative   2 children  - [place] lives in [place] minutes away.   09/09/21 - She is having to stay home more to take care of husband who is not in good health   Social Determinants of Health   Financial Resource Strain: Low Risk  (09/09/2021)   Overall Financial Resource Strain (CARDIA)    Difficulty of Paying Living Expenses: Not  hard at all  Food Insecurity: No Food Insecurity (09/09/2021)   Hunger Vital Sign    Worried About Running Out of Food in the Last Year: Never true    Ran Out of Food in the Last Year: Never true  Transportation Needs: No Transportation Needs (09/09/2021)   PRAPARE - Hydrologist (Medical): No    Lack of Transportation (Non-Medical):  No  Physical Activity: Insufficiently Active (09/09/2021)   Exercise Vital Sign    Days of Exercise per Week: 7 days    Minutes of Exercise per Session: 20 min  Stress: No Stress Concern Present (09/09/2021)   [place]    Feeling of Stress : Not at all  Social Connections: Moderately Isolated (09/09/2021)   Social Connection and Isolation Panel [NHANES]    Frequency of Communication with Friends and Family: More than three times a week    Frequency of Social Gatherings with Friends and Family: More than three times a week    Attends Religious Services: Never    Marine scientist or Organizations: No    Attends Archivist Meetings: Never    Marital Status: Married  Human resources officer Violence: Not At Risk (09/09/2021)   Humiliation, Afraid, Rape, and Kick questionnaire    Fear of Current or Ex-Partner: No    Emotionally Abused: No    Physically Abused: No    Sexually Abused: No   Past Surgical History:  Procedure Laterality Date   ABDOMINAL HYSTERECTOMY     arm surgery     COLONOSCOPY N/A 09/22/2018   Procedure: COLONOSCOPY;  Surgeon: Rogene Houston, MD;  Location: [facility];  Service: Endoscopy;  Laterality: N/A;  93   FOOT SURGERY     KNEE SURGERY     Family History  Problem Relation Age of Onset   Cancer Mother        lung cancer   Cancer Father        lymphnodes   Diabetes Sister    Lung cancer Sister    Diabetes Brother    Cancer Maternal Grandfather    Heart disease Paternal Grandfather    Diabetes Son    Cancer Maternal Aunt        throat   Cancer Paternal Uncle        unknown    Cancer Other        breast   Colon cancer Neg Hx     Current Outpatient Medications:    Alpha-Lipoic Acid 600 MG CAPS, Take 1 capsule (600 mg total) by mouth daily. For diabetic neuropathy, Disp: 90 capsule, Rfl: 3   aspirin EC 81 MG tablet, Take 81 mg by mouth in the morning and at bedtime. Swallow  whole., Disp: , Rfl:    atorvastatin (LIPITOR) 40 MG tablet, TAKE 1 TABLET BY MOUTH EVERYDAY AT BEDTIME, Disp: 90 tablet, Rfl: 3   benzonatate (TESSALON PERLES) 100 MG capsule, Take 1 capsule (100 mg total) by mouth 3 (three) times daily as needed for cough., Disp: 20 capsule, Rfl: 0   Blood Glucose Monitoring Suppl (ONE TOUCH ULTRA 2) w/Device KIT, UAD to check BGs E11.9, Disp: 1 kit, Rfl: 0   glipiZIDE (GLUCOTROL XL) 5 MG 24 hr tablet, Take 1 tablet (5 mg total) by mouth daily with breakfast. For fasting sugar >200., Disp: 90 tablet, Rfl: 3   guaiFENesin-codeine 100-10 MG/5ML syrup, Take 5 mLs by mouth 3 (three) times  daily as needed for cough., Disp: 120 mL, Rfl: 0   lisinopril (ZESTRIL) 10 MG tablet, TAKE 1 TABLET (10 MG TOTAL) BY MOUTH IN THE MORNING AND AT BEDTIME. (STOP TAKING LISINOPRIL 20MG), Disp: 180 tablet, Rfl: 0   metFORMIN (GLUCOPHAGE-XR) 500 MG 24 hr tablet, TAKE 2 TABLETS BY MOUTH EVERY DAY WITH BREAKFAST, Disp: 180 tablet, Rfl: 0   metoprolol tartrate (LOPRESSOR) 25 MG tablet, TAKE 1 TABLET BY MOUTH TWICE A DAY, Disp: 180 tablet, Rfl: 0   OneTouch Delica Lancets 90W MISC, Check BS BID Dx E11.40, Disp: 200 each, Rfl: 3   ONETOUCH ULTRA test strip, CHECK BLOOD SUGAR TWICE DAILY DX 11.40, Disp: 200 strip, Rfl: 4   pramipexole (MIRAPEX) 0.25 MG tablet, Take 1 tablet 2 hours before bedtime for restless legs, Disp: 90 tablet, Rfl: 3   saxagliptin HCl (ONGLYZA) 5 MG TABS tablet, Take 1 tablet (5 mg total) by mouth daily., Disp: 90 tablet, Rfl: 3   TURMERIC PO, Take 1 capsule by mouth 2 (two) times daily. , Disp: , Rfl:    zolpidem (AMBIEN) 5 MG tablet, take 1 tablet(5 mg total) by mouth at bedtime as needed for sleep., Disp: 30 tablet, Rfl: 5  Allergies  Allergen Reactions   Tape Rash     ROS: Review of Systems A comprehensive review of systems was negative except for: Some stress related to the health of her husband and self.  Her sister died from metastatic lung cancer recently as  well.  The patient was sick with COVID and also sustained a fall since our last time seeing each other.   Has had some residual ear fullness.  Physical exam BP 123/81   Pulse 62   Temp 98.2 F (36.8 C)   Ht _0  (1.651 m)   Wt 164 lb 3.2 oz (74.5 kg)   SpO2 97%   BMI 27.32 kg/m  General appearance: alert, cooperative, and appears stated age Head: Normocephalic, without obvious abnormality, atraumatic Eyes: negative findings: lids and lashes normal, conjunctivae and sclerae normal, corneas clear, and pupils equal, round, reactive to light and accomodation Ears: normal TM's and external ear canals both ears Nose: Nares normal. Septum midline. Mucosa normal. No drainage or sinus tenderness. Throat: lips, mucosa, and tongue normal; teeth and gums normal Neck: no adenopathy, no carotid bruit, supple, symmetrical, trachea midline, and thyroid not enlarged, symmetric, no tenderness/mass/nodules Back: symmetric, no curvature. ROM normal. No CVA tenderness. Lungs: clear to auscultation bilaterally Heart: regular rate and rhythm, S1, S2 normal, no murmur, click, rub or gallop Abdomen: soft, non-tender; bowel sounds normal; no masses,  no organomegaly Extremities: extremities normal, atraumatic, no cyanosis or edema Pulses: 2+ and symmetric Skin: Skin color, texture, turgor normal. No rashes or lesions Lymph nodes: Cervical, supraclavicular, and axillary nodes normal. Neurologic: Grossly normal Diabetic Foot Exam - Simple   Simple Foot Form Diabetic Foot exam was performed with the following findings: Yes 01/01/2022  8:49 AM  Visual Inspection No deformities, no ulcerations, no other skin breakdown bilaterally: Yes Sensation Testing Intact to touch and monofilament testing bilaterally: Yes Pulse Check Posterior Tibialis and Dorsalis pulse intact bilaterally: Yes Comments        Assessment/ Plan: Candace Lopez here for annual physical exam.   Annual physical exam  Primary  insomnia - Plan: ToxASSURE Select 13 (MW), Urine, zolpidem (AMBIEN) 5 MG tablet  Controlled substance agreement signed  Type 2 diabetes mellitus with diabetic neuropathy, without long-term current use of insulin ([place]) - Plan: Microalbumin /  creatinine urine ratio, glipiZIDE (GLUCOTROL XL) 5 MG 24 hr tablet  Hyperlipidemia associated with type 2 diabetes mellitus ([place])  Hypertension associated with diabetes ([place])  Personal history of COVID-19  ROI from diabetic eye exam completed.  Urine microalbumin completed today.  A1c now uncontrolled, rising to 8.5.  Therefore of added glipizide to take each morning as long as her blood sugars remain above 200 fasting.  She hopefully will be getting back in an exercise routine and will be certainly cleaning up her diet throughout the holidays.  She will continue her other medications as prescribed.  Blood pressure is well controlled.  Cholesterol was slight rise and this is likely due to sugar elevation.  We reviewed her labs and handout was provided today  Ambien renewed.  Insomnia chronic and stable.  UDS and CSC were updated as per office policy.  National narcotic database reviewed and there were no red flags  Has some ongoing residual ear fullness.  Recommended use of Flonase and/or antihistamine to reduce pressure  Counseled on healthy lifestyle choices, including diet (rich in fruits, vegetables and lean meats and low in salt and simple carbohydrates) and exercise (at least 30 minutes of moderate physical activity daily).  Patient to follow up in 31mfor DM  Yolando Gillum M. GLajuana Ripple DO

## 2022-01-02 LAB — MICROALBUMIN / CREATININE URINE RATIO
Creatinine, Urine: 142.9 mg/dL
Microalb/Creat Ratio: 13 mg/g creat (ref 0–29)
Microalbumin, Urine: 18.3 ug/mL

## 2022-01-03 LAB — TOXASSURE SELECT 13 (MW), URINE

## 2022-01-30 ENCOUNTER — Other Ambulatory Visit: Payer: Self-pay | Admitting: Family Medicine

## 2022-01-30 DIAGNOSIS — E1159 Type 2 diabetes mellitus with other circulatory complications: Secondary | ICD-10-CM

## 2022-04-03 ENCOUNTER — Other Ambulatory Visit: Payer: Self-pay | Admitting: Family Medicine

## 2022-04-03 DIAGNOSIS — E1159 Type 2 diabetes mellitus with other circulatory complications: Secondary | ICD-10-CM

## 2022-04-03 DIAGNOSIS — E1169 Type 2 diabetes mellitus with other specified complication: Secondary | ICD-10-CM

## 2022-04-04 ENCOUNTER — Ambulatory Visit (INDEPENDENT_AMBULATORY_CARE_PROVIDER_SITE_OTHER): Payer: Medicare HMO | Admitting: Family Medicine

## 2022-04-04 ENCOUNTER — Encounter: Payer: Self-pay | Admitting: Family Medicine

## 2022-04-04 VITALS — BP 137/81 | HR 80 | Temp 98.4°F | Ht 65.0 in | Wt 170.0 lb

## 2022-04-04 DIAGNOSIS — E1159 Type 2 diabetes mellitus with other circulatory complications: Secondary | ICD-10-CM | POA: Diagnosis not present

## 2022-04-04 DIAGNOSIS — E1140 Type 2 diabetes mellitus with diabetic neuropathy, unspecified: Secondary | ICD-10-CM

## 2022-04-04 DIAGNOSIS — E1169 Type 2 diabetes mellitus with other specified complication: Secondary | ICD-10-CM | POA: Diagnosis not present

## 2022-04-04 DIAGNOSIS — E785 Hyperlipidemia, unspecified: Secondary | ICD-10-CM

## 2022-04-04 DIAGNOSIS — I152 Hypertension secondary to endocrine disorders: Secondary | ICD-10-CM

## 2022-04-04 LAB — LIPID PANEL
Chol/HDL Ratio: 4.4 ratio (ref 0.0–4.4)
Cholesterol, Total: 131 mg/dL (ref 100–199)
HDL: 30 mg/dL — ABNORMAL LOW (ref >39)
LDL Chol Calc (NIH): 67 mg/dL (ref 0–99)
Triglycerides: 202 mg/dL — ABNORMAL HIGH (ref 0–149)
VLDL Cholesterol Cal: 34 mg/dL (ref 5–40)

## 2022-04-04 LAB — BAYER DCA HB A1C WAIVED: HB A1C (BAYER DCA - WAIVED): 7.7 % — ABNORMAL HIGH (ref 4.8–5.6)

## 2022-04-04 MED ORDER — OZEMPIC (0.25 OR 0.5 MG/DOSE) 2 MG/3ML ~~LOC~~ SOPN: 0.5000 mg | PEN_INJECTOR | SUBCUTANEOUS | 3 refills | Status: DC

## 2022-04-04 NOTE — Progress Notes (Signed)
Subjective: CC:DM PCP: Janora Norlander, DO DY:[id number] H Candace Lopez is a 72 y.o. female presenting to clinic today for:  1. Type 2 Diabetes with hypertension, hyperlipidemia/osteoarthritis:  Glipizide added last visit due to uncontrolled DM.  She admits that the blood sugars continue to run over 200 despite compliance with Onglyza, metformin and now glipizide.  No hypoglycemic episodes occurring.  She has been limiting her carbohydrate intake but continues to gain weight.  She admits that she is not exercising regularly due to multiple arthritic changes and needing to look after her husband.  However she does not want to be on any pain medications either for the arthritis.  Last eye exam: needs Last foot exam: UTD Last A1c:  Lab Results  Component Value Date   HGBA1C 8.5 (H) 12/30/2021   Nephropathy screen indicated?: UTD Last flu, zoster and/or pneumovax:  Immunization History  Administered Date(s) Administered   Fluad Quad(high Dose 65+) 10/07/2018, 10/31/2019, 11/05/2020, 12/03/2021   Influenza, High Dose Seasonal PF 12/29/2016, 10/21/2017   Moderna Covid-19 Vaccine Bivalent Booster 80yr & up 03/07/2021   Moderna Sars-Covid-2 Vaccination 04/26/2019, 05/24/2019, 01/24/2020   Pneumococcal Conjugate-13 08/26/2017   Pneumococcal Polysaccharide-23 12/13/2019   Tdap 07/16/2012    ROS: Reports frequent urination but no polydipsia.  Has had some intermittent blurred vision.  No chest pain, shortness of breath or edema reported.  No personal or family history of multiple endocrine type II neoplasia, thyroid cancers or pancreatitis.    ROS: Per HPI  Allergies  Allergen Reactions   Tape Rash   Past Medical History:  Diagnosis Date   DDD (degenerative disc disease), lumbar    Diabetes mellitus without complication (HCC)    GERD (gastroesophageal reflux disease)    Hyperlipidemia    Hypertension    Urge incontinence of urine     Current Outpatient Medications:     Alpha-Lipoic Acid 600 MG CAPS, Take 1 capsule (600 mg total) by mouth daily. For diabetic neuropathy, Disp: 90 capsule, Rfl: 3   aspirin EC 81 MG tablet, Take 81 mg by mouth in the morning and at bedtime. Swallow whole., Disp: , Rfl:    atorvastatin (LIPITOR) 40 MG tablet, TAKE 1 TABLET BY MOUTH EVERYDAY AT BEDTIME, Disp: 90 tablet, Rfl: 3   Blood Glucose Monitoring Suppl (ONE TOUCH ULTRA 2) w/Device KIT, UAD to check BGs E11.9, Disp: 1 kit, Rfl: 0   glipiZIDE (GLUCOTROL XL) 5 MG 24 hr tablet, Take 1 tablet (5 mg total) by mouth daily with breakfast. For fasting sugar >200., Disp: 90 tablet, Rfl: 3   lisinopril (ZESTRIL) 10 MG tablet, TAKE 1 TABLET (10 MG TOTAL) BY MOUTH IN THE MORNING AND AT BEDTIME. (STOP TAKING LISINOPRIL '20MG'$ ), Disp: 180 tablet, Rfl: 0   metFORMIN (GLUCOPHAGE-XR) 500 MG 24 hr tablet, TAKE 2 TABLETS BY MOUTH EVERY DAY WITH BREAKFAST, Disp: 180 tablet, Rfl: 0   metoprolol tartrate (LOPRESSOR) 25 MG tablet, TAKE 1 TABLET BY MOUTH TWICE A DAY, Disp: 180 tablet, Rfl: 0   OneTouch Delica Lancets [id number], Check BS BID Dx E11.40, Disp: 200 each, Rfl: 3   ONETOUCH ULTRA test strip, CHECK BLOOD SUGAR TWICE DAILY DX 11.40, Disp: 200 strip, Rfl: 4   pramipexole (MIRAPEX) 0.25 MG tablet, Take 1 tablet 2 hours before bedtime for restless legs, Disp: 90 tablet, Rfl: 3   saxagliptin HCl (ONGLYZA) 5 MG TABS tablet, Take 1 tablet (5 mg total) by mouth daily., Disp: 90 tablet, Rfl: 3   TURMERIC PO, Take 1 capsule  by mouth 2 (two) times daily. , Disp: , Rfl:    zolpidem (AMBIEN) 5 MG tablet, take 1 tablet(5 mg total) by mouth at bedtime as needed for sleep., Disp: 30 tablet, Rfl: 5 Social History   Socioeconomic History   Marital status: Married    Spouse name: Candace Lopez   Number of children: 2   Years of education: Not on file   Highest education level: Not on file  Occupational History   Occupation: retired  Tobacco Use   Smoking status: Never   Smokeless tobacco: Never  Vaping Use    Vaping Use: Never used  Substance and Sexual Activity   Alcohol use: No   Drug use: No   Sexual activity: Not on file  Other Topics Concern   Not on file  Social History Narrative   2 children  - son lives in [place], daughter 15 minutes away.   09/09/21 - She is having to stay home more to take care of husband who is not in good health   Social Determinants of Health   Financial Resource Strain: Low Risk  (09/09/2021)   Overall Financial Resource Strain (CARDIA)    Difficulty of Paying Living Expenses: Not hard at all  Food Insecurity: No Food Insecurity (09/09/2021)   Hunger Vital Sign    Worried About Running Out of Food in the Last Year: Never true    [place] in the Last Year: Never true  Transportation Needs: No Transportation Needs (09/09/2021)   PRAPARE - Hydrologist (Medical): No    Lack of Transportation (Non-Medical): No  Physical Activity: Insufficiently Active (09/09/2021)   Exercise Vital Sign    Days of Exercise per Week: 7 days    Minutes of Exercise per Session: 20 min  Stress: No Stress Concern Present (09/09/2021)   [place]    Feeling of Stress : Not at all  Social Connections: Moderately Isolated (09/09/2021)   Social Connection and Isolation Panel [NHANES]    Frequency of Communication with Friends and Family: More than three times a week    Frequency of Social Gatherings with Friends and Family: More than three times a week    Attends Religious Services: Never    Marine scientist or Organizations: No    Attends Archivist Meetings: Never    Marital Status: Married  Human resources officer Violence: Not At Risk (09/09/2021)   Humiliation, Afraid, Rape, and Kick questionnaire    Fear of Current or Ex-Partner: No    Emotionally Abused: No    Physically Abused: No    Sexually Abused: No   Family History  Problem Relation Age of Onset   Cancer  Mother        lung cancer   Cancer Father        lymphnodes   Diabetes Sister    Lung cancer Sister    Diabetes Brother    Cancer Maternal Grandfather    Heart disease Paternal Grandfather    Diabetes Son    Cancer Maternal Aunt        throat   Cancer Paternal Uncle        unknown    Cancer Other        breast   Colon cancer Neg Hx     Objective: Office vital signs reviewed. BP 137/81   Pulse 80   Temp 98.4 F (36.9 C)   Ht 5'  5" (1.651 m)   Wt 170 lb (77.1 kg)   SpO2 96%   BMI 28.29 kg/m   Physical Examination:  General: Awake, alert, well nourished, No acute distress HEENT: sclera white, MMM Cardio: regular rate and rhythm, S1S2 heard, no murmurs appreciated Pulm: clear to auscultation bilaterally, no wheezes, rhonchi or rales; normal work of breathing on room air  Assessment/ Plan: 72 y.o. female   Type 2 diabetes mellitus with diabetic neuropathy, without long-term current use of insulin ([place]) - Plan: Bayer DCA Hb A1c Waived, Semaglutide,0.25 or 0.'5MG'$ /DOS, (OZEMPIC, 0.25 OR 0.5 MG/DOSE,) 2 MG/3ML SOPN, AMB Referral to Chronic Care Management Services  Hyperlipidemia associated with type 2 diabetes mellitus ([place]) - Plan: Lipid Panel, AMB Referral to Chronic Care Management Services  Hypertension associated with diabetes ([place]) - Plan: AMB Referral to Chronic Care Management Services   Advised to hold the Onglyza, continue metformin, glipizide.  I am adding on Ozempic.  0.25 mg weekly for 4 weeks then advance to 0.5 mg weekly thereafter.  Sample provided in first injection performed today.  Patient had no difficulty self administering medication.  I have placed referral to CCM for assistance with medication and titration up to 0.5 mg.  I discussed with patient red flag signs and symptoms warranting further evaluation.  Check lipid panel.  Continue statin  Blood pressure is controlled.  No changes.  No orders of the defined types were placed in this  encounter.  No orders of the defined types were placed in this encounter.    Janora Norlander, DO [place] [phone]

## 2022-04-07 ENCOUNTER — Telehealth: Payer: Self-pay

## 2022-04-07 NOTE — Progress Notes (Unsigned)
  Chronic Care Management   Note  04/07/2022 Name: Candace Lopez MRN: [id number] DOB: 10/19/1950  Candace Lopez is a 72 y.o. year old female who is a primary care patient of Janora Norlander, DO. I reached out to Terrence Dupont by phone today in response to a referral sent by Ms. Candace Lopez PCP.  The first contact attempt was unsuccessful.   Follow up plan: Additional outreach attempts will be made.  Noreene Larsson, [place], [place] [address] Direct Dial: [phone] [email or web address]

## 2022-04-08 ENCOUNTER — Telehealth: Payer: Self-pay | Admitting: Pharmacist

## 2022-04-08 NOTE — Telephone Encounter (Signed)
    04/08/2022 Name: Candace Lopez MRN: [id number] DOB: 09-Jul-1950   Patient to transition from Onglyza to [facility] for better glycemic control.  Durward Mallard, will you send me full PAP for patient?  Will be for Ozempic 0.5mg  weekly.  Patient to be scheduled with PharmD for medication assistance.   [name], PharmD, BCACP Clinical Pharmacist, [facility]

## 2022-04-10 NOTE — Progress Notes (Signed)
  Chronic Care Management   Note  04/10/2022 Name: Candace Lopez MRN: [id number] DOB: Jun 02, 1950  Candace Lopez is a 72 y.o. year old female who is a primary care patient of Janora Norlander, DO. I reached out to Candace Lopez by phone today in response to a referral sent by Candace Lopez PCP.  Candace Lopez was given information about Chronic Care Management services today including:  CCM service includes personalized support from designated clinical staff supervised by the physician, including individualized plan of care and coordination with other care providers 24/7 contact phone numbers for assistance for urgent and routine care needs. Service will only be billed when office clinical staff spend 20 minutes or more in a month to coordinate care. Only one practitioner may furnish and bill the service in a calendar month. The patient may stop CCM services at amy time (effective at the end of the month) by phone call to the office staff. The patient will be responsible for cost sharing (co-pay) or up to 20% of the service fee (after annual deductible is met)  Candace Lopez  agreedto scheduling an appointment with the CCM RN Case Manager and Pharm d   Follow up plan: Patient agreed to scheduled appointment with RN Case Manager on 04/11/2022 and Pharm d 05/09/2022(date/time).   Candace Lopez, [place], [place] [address] Direct Dial: [phone] [email or web address]

## 2022-04-11 ENCOUNTER — Ambulatory Visit (INDEPENDENT_AMBULATORY_CARE_PROVIDER_SITE_OTHER): Payer: Medicare HMO | Admitting: *Deleted

## 2022-04-11 DIAGNOSIS — E1169 Type 2 diabetes mellitus with other specified complication: Secondary | ICD-10-CM

## 2022-04-11 DIAGNOSIS — E1140 Type 2 diabetes mellitus with diabetic neuropathy, unspecified: Secondary | ICD-10-CM

## 2022-04-11 DIAGNOSIS — E1159 Type 2 diabetes mellitus with other circulatory complications: Secondary | ICD-10-CM

## 2022-04-11 NOTE — Plan of Care (Signed)
Chronic Care Management Provider Comprehensive Care Plan    04/11/2022 Name: MAECEE SCHNELL MRN: [id number] DOB: 25-Mar-1950  Referral to Chronic Care Management (CCM) services was placed by Provider:  Ronnie Doss DO on Date: 06/16/22 at 215 pm.  Chronic Condition 1: HYPERTENSION Provider Assessment and Plan Blood pressure is controlled. No changes.    Expected Outcome/Goals Addressed This Visit (Provider CCM goals/Provider Assessment and plan  CCM (HYPERTENSION) EXPECTED OUTCOME: MONITOR, SELF-MANAGE AND REDUCE SYMPTOMS OF HYPERTENSION  Symptom Management Condition 1: Take medications as prescribed   Attend all scheduled provider appointments Call pharmacy for medication refills 3-7 days in advance of running out of medications Attend church or other social activities Perform all self care activities independently  Perform IADL's (shopping, preparing meals, housekeeping, managing finances) independently Call provider office for new concerns or questions  check blood pressure weekly choose a place to take my blood pressure (home, clinic or office, retail store) write blood pressure results in a log or diary learn about high blood pressure keep a blood pressure log take blood pressure log to all doctor appointments keep all doctor appointments take medications for blood pressure exactly as prescribed report new symptoms to your doctor eat more whole grains, fruits and vegetables, lean meats and healthy fats Look over education mailed- low sodium diet fall prevention strategies: change position slowly, use assistive device such as walker or cane (per provider recommendations) when walking, keep walkways clear, have good lighting in room. It is important to contact your provider if you have any falls, maintain muscle strength/tone by exercise per provider recommendations.  Chronic Condition 2: DIABETES Provider Assessment and Plan Type 2 diabetes mellitus with diabetic neuropathy,  without long-term current use of insulin ([place]) - Plan: Bayer DCA Hb A1c Waived, Semaglutide,0.25 or 0.5MG /DOS, (OZEMPIC, 0.25 OR 0.5 MG/DOSE,) 2 MG/3ML SOPN, AMB Referral to Chronic Care Management Services   Hyperlipidemia associated with type 2 diabetes mellitus ([place]) - Plan: Lipid Panel, AMB Referral to Chronic Care Management Services   Hypertension associated with diabetes ([place]) - Plan: AMB Referral to Chronic Care Management Services   Expected Outcome/Goals Addressed This Visit (Provider CCM goals/Provider Assessment and plan  CCM (DIABETES) EXPECTED OUTCOME: MONITOR, SELF- MANAGE AND REDUCE SYMPTOMS OF DIABETES  Symptom Management Condition 2: Take medications as prescribed   Attend all scheduled provider appointments Call pharmacy for medication refills 3-7 days in advance of running out of medications Attend church or other social activities Perform all self care activities independently  Perform IADL's (shopping, preparing meals, housekeeping, managing finances) independently Call provider office for new concerns or questions  check blood sugar at prescribed times: once daily check feet daily for cuts, sores or redness enter blood sugar readings and medication or insulin into daily log take the blood sugar log to all doctor visits take the blood sugar meter to all doctor visits trim toenails straight across fill half of plate with vegetables limit fast food meals to no more than 1 per week manage portion size read food labels for fat, fiber, carbohydrates and portion size wash and dry feet carefully every day wear comfortable, cotton socks Look over education mailed- hypoglycemia Advanced directives mailed- please look over and complete  Chronic Condition 3: HYPERLIPIDEMIA Provider Assessment and Plan Check lipid panel.  Continue statin    Expected Outcome/Goals Addressed This Visit (Provider CCM goals/Provider Assessment and plan  CCM (HYPERLIPIDEMIA) EXPECTED OUTCOME:  MONITOR, SELF-MANAGE AND REDUCE SYMPTOMS OF HYPERLIPIDEMIA  Symptom Management Condition 3: Take medications as prescribed  Attend all scheduled provider appointments Call pharmacy for medication refills 3-7 days in advance of running out of medications Attend church or other social activities Perform all self care activities independently  Perform IADL's (shopping, preparing meals, housekeeping, managing finances) independently Call provider office for new concerns or questions  - call doctor with any symptoms you believe are related to your medicine - call doctor when you experience any new symptoms - go to all doctor appointments as scheduled - adhere to prescribed diet: heart healthy Continue working out at the gym Look over education mailed- heart healthy diet  Problem List Patient Active Problem List   Diagnosis Date Noted   Mild obesity 09/06/2020   Degenerative disk disease 06/30/2017   Mild obstructive sleep apnea 06/30/2017   Type 2 diabetes mellitus with diabetic neuropathy, unspecified ([place]) 03/14/2016   Hypertension associated with diabetes ([place]) 03/14/2016   Hyperlipidemia associated with type 2 diabetes mellitus ([place]) 03/14/2016   Restless leg syndrome, familial, uncontrolled 03/14/2016   Urge incontinence 06/28/2011   Left knee pain 01/23/2010    Medication Management  Current Outpatient Medications:    Alpha-Lipoic Acid 600 MG CAPS, Take 1 capsule (600 mg total) by mouth daily. For diabetic neuropathy, Disp: 90 capsule, Rfl: 3   aspirin EC 81 MG tablet, Take 81 mg by mouth in the morning and at bedtime. Swallow whole., Disp: , Rfl:    atorvastatin (LIPITOR) 40 MG tablet, TAKE 1 TABLET BY MOUTH EVERYDAY AT BEDTIME, Disp: 90 tablet, Rfl: 0   Blood Glucose Monitoring Suppl (ONE TOUCH ULTRA 2) w/Device KIT, UAD to check BGs E11.9, Disp: 1 kit, Rfl: 0   glipiZIDE (GLUCOTROL XL) 5 MG 24 hr tablet, Take 1 tablet (5 mg total) by mouth daily with breakfast. For fasting  sugar >200., Disp: 90 tablet, Rfl: 3   lisinopril (ZESTRIL) 10 MG tablet, TAKE 1 TABLET (10 MG TOTAL) BY MOUTH IN THE MORNING AND AT BEDTIME. (STOP TAKING LISINOPRIL 20MG ), Disp: 180 tablet, Rfl: 0   metFORMIN (GLUCOPHAGE-XR) 500 MG 24 hr tablet, TAKE 2 TABLETS BY MOUTH EVERY DAY WITH BREAKFAST, Disp: 180 tablet, Rfl: 0   metoprolol tartrate (LOPRESSOR) 25 MG tablet, TAKE 1 TABLET BY MOUTH TWICE A DAY, Disp: 180 tablet, Rfl: 0   OneTouch Delica Lancets 99991111 MISC, Check BS BID Dx E11.40, Disp: 200 each, Rfl: 3   ONETOUCH ULTRA test strip, CHECK BLOOD SUGAR TWICE DAILY DX 11.40, Disp: 200 strip, Rfl: 4   pramipexole (MIRAPEX) 0.25 MG tablet, Take 1 tablet 2 hours before bedtime for restless legs, Disp: 90 tablet, Rfl: 3   Semaglutide,0.25 or 0.5MG /DOS, (OZEMPIC, 0.25 OR 0.5 MG/DOSE,) 2 MG/3ML SOPN, Inject 0.5 mg into the skin every 7 (seven) days., Disp: 9 mL, Rfl: 3   TURMERIC PO, Take 1 capsule by mouth 2 (two) times daily. , Disp: , Rfl:    zolpidem (AMBIEN) 5 MG tablet, take 1 tablet(5 mg total) by mouth at bedtime as needed for sleep., Disp: 30 tablet, Rfl: 5  Cognitive Assessment Identity Confirmed: : Name; DOB Cognitive Status: Normal   Functional Assessment Hearing Difficulty or Deaf: no Wear Glasses or Blind: yes Vision Management: reading glasses Concentrating, Remembering or Making Decisions Difficulty (CP): no Difficulty Communicating: no Difficulty Eating/Swallowing: no Walking or Climbing Stairs Difficulty: no Dressing/Bathing Difficulty: no Doing Errands Independently Difficulty (such as shopping) (CP): no   Caregiver Assessment  Primary Source of Support/Comfort: child(ren) Name of Support/Comfort Primary Source: adult daughter Theophilus Kinds People in Home: spouse Primary Roles/Responsibilities: caregiver  for other(s) (assists with caring for her spouse)   Planned Interventions  Provider established cholesterol goals reviewed; Counseled on importance of regular  laboratory monitoring as prescribed; Provided HLD educational materials; Reviewed importance of limiting foods high in cholesterol; Reviewed exercise goals and target of 150 minutes per week; Screening for signs and symptoms of depression related to chronic disease state;  Assessed social determinant of health barriers;  Provided education to patient about basic DM disease process; Reviewed medications with patient and discussed importance of medication adherence;        Reviewed prescribed diet with patient carbohydrate modified; Counseled on importance of regular laboratory monitoring as prescribed;        Discussed plans with patient for ongoing care management follow up and provided patient with direct contact information for care management team;      Provided patient with written educational materials related to hypo and hyperglycemia and importance of correct treatment;       Advised patient, providing education and rationale, to check cbg once daily and record        call provider for findings outside established parameters;       Review of patient status, including review of consultants reports, relevant laboratory and other test results, and medications completed;       Screening for signs and symptoms of depression related to chronic disease state;        Assessed social determinant of health barriers;        Advanced directives packet mailed Evaluation of current treatment plan related to hypertension self management and patient's adherence to plan as established by provider;   Reviewed prescribed diet low sodium Reviewed medications with patient and discussed importance of compliance;  Counseled on the importance of exercise goals with target of 150 minutes per week Discussed plans with patient for ongoing care management follow up and provided patient with direct contact information for care management team; Advised patient, providing education and rationale, to monitor blood  pressure daily and record, calling PCP for findings outside established parameters;  Provided education on prescribed diet low sodium;  Discussed complications of poorly controlled blood pressure such as heart disease, stroke, circulatory complications, vision complications, kidney impairment, sexual dysfunction;  Screening for signs and symptoms of depression related to chronic disease state;  Assessed social determinant of health barriers;  Reviewed upcoming appointments including telephone call with pharmacist on 05/09/22 Reviewed safety precautions  Interaction and coordination with outside resources, practitioners, and providers See CCM Referral  Care Plan: Printed and mailed to patient

## 2022-04-11 NOTE — Patient Instructions (Signed)
Please call the care guide team at [phone] if you need to cancel or reschedule your appointment.   If you are experiencing a Mental Health or [place] or need someone to talk to, please call the Suicide and Crisis Lifeline: 988 call the [place] National Suicide Prevention Lifeline: 747-633-2328 or TTY: 617-562-9551 TTY 973 454 3895) to talk to a trained counselor call 1-800-273-TALK (toll free, 24 hour hotline) go to [facility] [address], [place] [phone]) call the [facility]: [phone] call 911   Following is a copy of the CCM Program Consent:  CCM service includes personalized support from designated clinical staff supervised by the physician, including individualized plan of care and coordination with other care providers 24/7 contact phone numbers for assistance for urgent and routine care needs. Service will only be billed when office clinical staff spend 20 minutes or more in a month to coordinate care. Only one practitioner may furnish and bill the service in a calendar month. The patient may stop CCM services at amy time (effective at the end of the month) by phone call to the office staff. The patient will be responsible for cost sharing (co-pay) or up to 20% of the service fee (after annual deductible is met)  Following is a copy of your full provider care plan:   Goals Addressed             This Visit's Progress    CCM (DIABETES) EXPECTED OUTCOME: MONITOR, SELF- MANAGE AND REDUCE SYMPTOMS OF DIABETES       Current Barriers:  Knowledge Deficits related to Diabetes management Care Coordination needs related to pharmacy needs, medication assistance in a patient with Diabetes Chronic Disease Management support and education needs related to Diabetes No Advanced Directives in place- pt requests documents to be mailed Patient reports she checks CBG once daily fasting with readings 140-187,  states this is an improvement because readings were near 200 most of the time Patient reports she does not drink sugary drinks, usually drinks water Pharmacist will be working with pt for medication assistance Patient reports she has all medications and taking as prescribed  Planned Interventions: Provided education to patient about basic DM disease process; Reviewed medications with patient and discussed importance of medication adherence;        Reviewed prescribed diet with patient carbohydrate modified; Counseled on importance of regular laboratory monitoring as prescribed;        Discussed plans with patient for ongoing care management follow up and provided patient with direct contact information for care management team;      Provided patient with written educational materials related to hypo and hyperglycemia and importance of correct treatment;       Advised patient, providing education and rationale, to check cbg once daily and record        call provider for findings outside established parameters;       Review of patient status, including review of consultants reports, relevant laboratory and other test results, and medications completed;       Screening for signs and symptoms of depression related to chronic disease state;        Assessed social determinant of health barriers;        Advanced directives packet mailed  Symptom Management: Take medications as prescribed   Attend all scheduled provider appointments Call pharmacy for medication refills 3-7 days in advance of running out of medications Attend church or other social activities Perform all self care  activities independently  Perform IADL's (shopping, preparing meals, housekeeping, managing finances) independently Call provider office for new concerns or questions  check blood sugar at prescribed times: once daily check feet daily for cuts, sores or redness enter blood sugar readings and medication or insulin into  daily log take the blood sugar log to all doctor visits take the blood sugar meter to all doctor visits trim toenails straight across fill half of plate with vegetables limit fast food meals to no more than 1 per week manage portion size read food labels for fat, fiber, carbohydrates and portion size wash and dry feet carefully every day wear comfortable, cotton socks Look over education mailed- hypoglycemia Advanced directives mailed- please look over and complete  Follow Up Plan: Telephone follow up appointment with care management team member scheduled for:  06/16/22 at 215 pm       CCM (HYPERLIPIDEMIA) EXPECTED OUTCOME: MONITOR, SELF-MANAGE AND REDUCE SYMPTOMS OF HYPERLIPIDEMIA       Current Barriers:  Knowledge Deficits related to Hyperlipidemia management Chronic Disease Management support and education needs related to Hyperlipidemia, diet  Planned Interventions: Provider established cholesterol goals reviewed; Counseled on importance of regular laboratory monitoring as prescribed; Provided HLD educational materials; Reviewed importance of limiting foods high in cholesterol; Reviewed exercise goals and target of 150 minutes per week; Screening for signs and symptoms of depression related to chronic disease state;  Assessed social determinant of health barriers;   Symptom Management: Take medications as prescribed   Attend all scheduled provider appointments Call pharmacy for medication refills 3-7 days in advance of running out of medications Attend church or other social activities Perform all self care activities independently  Perform IADL's (shopping, preparing meals, housekeeping, managing finances) independently Call provider office for new concerns or questions  - call doctor with any symptoms you believe are related to your medicine - call doctor when you experience any new symptoms - go to all doctor appointments as scheduled - adhere to prescribed diet: heart  healthy Continue working out at the gym Look over education mailed- heart healthy diet  Follow Up Plan: Telephone follow up appointment with care management team member scheduled for:  06/16/22 at 215 pm       CCM (HYPERTENSION) EXPECTED OUTCOME: MONITOR, SELF-MANAGE AND REDUCE SYMPTOMS OF HYPERTENSION       Current Barriers:  Knowledge Deficits related to Hypertension management Chronic Disease Management support and education needs related to Hypertension, diet No Advanced Directives in place- pt requests information be mailed Patient reports she lives with spouse and assists with his care due to he is in poor health, patient states she can call on her daughter at any time for assistance if needed, reports independent with all aspects of her care. Patient reports she has blood pressure cuff but does not monitor blood pressure Patient reports she is getting back into exercising and trying to be more consistent, goes to gym to workout Patient reports 2 falls in the past year and sprained her ankle with one of the falls  Planned Interventions: Evaluation of current treatment plan related to hypertension self management and patient's adherence to plan as established by provider;   Reviewed prescribed diet low sodium Reviewed medications with patient and discussed importance of compliance;  Counseled on the importance of exercise goals with target of 150 minutes per week Discussed plans with patient for ongoing care management follow up and provided patient with direct contact information for care management team; Advised patient, providing education and rationale,  to monitor blood pressure daily and record, calling PCP for findings outside established parameters;  Provided education on prescribed diet low sodium;  Discussed complications of poorly controlled blood pressure such as heart disease, stroke, circulatory complications, vision complications, kidney impairment, sexual dysfunction;   Screening for signs and symptoms of depression related to chronic disease state;  Assessed social determinant of health barriers;  Reviewed upcoming appointments including telephone call with pharmacist on 05/09/22 Reviewed safety precautions  Symptom Management: Take medications as prescribed   Attend all scheduled provider appointments Call pharmacy for medication refills 3-7 days in advance of running out of medications Attend church or other social activities Perform all self care activities independently  Perform IADL's (shopping, preparing meals, housekeeping, managing finances) independently Call provider office for new concerns or questions  check blood pressure weekly choose a place to take my blood pressure (home, clinic or office, retail store) write blood pressure results in a log or diary learn about high blood pressure keep a blood pressure log take blood pressure log to all doctor appointments keep all doctor appointments take medications for blood pressure exactly as prescribed report new symptoms to your doctor eat more whole grains, fruits and vegetables, lean meats and healthy fats Look over education mailed- low sodium diet fall prevention strategies: change position slowly, use assistive device such as walker or cane (per provider recommendations) when walking, keep walkways clear, have good lighting in room. It is important to contact your provider if you have any falls, maintain muscle strength/tone by exercise per provider recommendations.  Follow Up Plan: Telephone follow up appointment with care management team member scheduled for:  06/16/22 at 215 pm          The patient verbalized understanding of instructions, educational materials, and care plan provided today and agreed to receive a mailed copy of patient instructions, educational materials, and care plan.   Telephone follow up appointment with care management team member scheduled for:  06/16/22 at  215 pm  Heart-Healthy Eating Plan Many factors influence your heart health, including eating and exercise habits. Heart health is also called coronary health. Coronary risk increases with abnormal blood fat (lipid) levels. A heart-healthy eating plan includes limiting unhealthy fats, increasing healthy fats, limiting salt (sodium) intake, and making other diet and lifestyle changes. What is my plan? Your health care provider may recommend that: You limit your fat intake to _________% or less of your total calories each day. You limit your saturated fat intake to _________% or less of your total calories each day. You limit the amount of cholesterol in your diet to less than _________ mg per day. You limit the amount of sodium in your diet to less than _________ mg per day. What are tips for following this plan? Cooking Cook foods using methods other than frying. Baking, boiling, grilling, and broiling are all good options. Other ways to reduce fat include: Removing the skin from poultry. Removing all visible fats from meats. Steaming vegetables in water or broth. Meal planning  At meals, imagine dividing your plate into fourths: Fill one-half of your plate with vegetables and green salads. Fill one-fourth of your plate with whole grains. Fill one-fourth of your plate with lean protein foods. Eat 2-4 cups of vegetables per day. One cup of vegetables equals 1 cup (91 g) broccoli or cauliflower florets, 2 medium carrots, 1 large bell pepper, 1 large sweet potato, 1 large tomato, 1 medium white potato, 2 cups (150 g) raw leafy greens. Eat  1-2 cups of fruit per day. One cup of fruit equals 1 small apple, 1 large banana, 1 cup (237 g) mixed fruit, 1 large orange,  cup (82 g) dried fruit, 1 cup (240 mL) 100% fruit juice. Eat more foods that contain soluble fiber. Examples include apples, broccoli, carrots, beans, peas, and barley. Aim to get 25-30 g of fiber per day. Increase your consumption  of legumes, nuts, and seeds to 4-5 servings per week. One serving of dried beans or legumes equals  cup (90 g) cooked, 1 serving of nuts is  oz (12 almonds, 24 pistachios, or 7 walnut halves), and 1 serving of seeds equals  oz (8 g). Fats Choose healthy fats more often. Choose monounsaturated and polyunsaturated fats, such as olive and canola oils, avocado oil, flaxseeds, walnuts, almonds, and seeds. Eat more omega-3 fats. Choose salmon, mackerel, sardines, tuna, flaxseed oil, and ground flaxseeds. Aim to eat fish at least 2 times each week. Check food labels carefully to identify foods with trans fats or high amounts of saturated fat. Limit saturated fats. These are found in animal products, such as meats, butter, and cream. Plant sources of saturated fats include palm oil, palm kernel oil, and coconut oil. Avoid foods with partially hydrogenated oils in them. These contain trans fats. Examples are stick margarine, some tub margarines, cookies, crackers, and other baked goods. Avoid fried foods. General information Eat more home-cooked food and less restaurant, buffet, and fast food. Limit or avoid alcohol. Limit foods that are high in added sugar and simple starches such as foods made using white refined flour (white breads, pastries, sweets). Lose weight if you are overweight. Losing just 5-10% of your body weight can help your overall health and prevent diseases such as diabetes and heart disease. Monitor your sodium intake, especially if you have high blood pressure. Talk with your health care provider about your sodium intake. Try to incorporate more vegetarian meals weekly. What foods should I eat? Fruits All fresh, canned (in natural juice), or frozen fruits. Vegetables Fresh or frozen vegetables (raw, steamed, roasted, or grilled). Green salads. Grains Most grains. Choose whole wheat and whole grains most of the time. Rice and pasta, including brown rice and pastas made with whole  wheat. Meats and other proteins Lean, well-trimmed beef, veal, pork, and lamb. Chicken and [place] without skin. All fish and shellfish. Wild duck, rabbit, pheasant, and venison. Egg whites or low-cholesterol egg substitutes. Dried beans, peas, lentils, and tofu. Seeds and most nuts. Dairy Low-fat or nonfat cheeses, including ricotta and mozzarella. Skim or 1% milk (liquid, powdered, or evaporated). Buttermilk made with low-fat milk. Nonfat or low-fat yogurt. Fats and oils Non-hydrogenated (trans-free) margarines. Vegetable oils, including soybean, sesame, sunflower, olive, avocado, peanut, safflower, corn, canola, and cottonseed. Salad dressings or mayonnaise made with a vegetable oil. Beverages Water (mineral or sparkling). Coffee and tea. Unsweetened ice tea. Diet beverages. Sweets and desserts Sherbet, gelatin, and fruit ice. Small amounts of dark chocolate. Limit all sweets and desserts. Seasonings and condiments All seasonings and condiments. The items listed above may not be a complete list of foods and beverages you can eat. Contact a dietitian for more options. What foods should I avoid? Fruits Canned fruit in heavy syrup. Fruit in cream or butter sauce. Fried fruit. Limit coconut. Vegetables Vegetables cooked in cheese, cream, or butter sauce. Fried vegetables. Grains Breads made with saturated or trans fats, oils, or whole milk. Croissants. Sweet rolls. Donuts. High-fat crackers, such as cheese crackers and chips. Meats  and other proteins Fatty meats, such as hot dogs, ribs, sausage, bacon, rib-eye roast or steak. High-fat deli meats, such as salami and bologna. Caviar. Domestic duck and goose. Organ meats, such as liver. Dairy Cream, sour cream, cream cheese, and creamed cottage cheese. Whole-milk cheeses. Whole or 2% milk (liquid, evaporated, or condensed). Whole buttermilk. Cream sauce or high-fat cheese sauce. Whole-milk yogurt. Fats and oils Meat fat, or shortening. Cocoa  butter, hydrogenated oils, palm oil, coconut oil, palm kernel oil. Solid fats and shortenings, including bacon fat, salt pork, lard, and butter. Nondairy cream substitutes. Salad dressings with cheese or sour cream. Beverages Regular sodas and any drinks with added sugar. Sweets and desserts Frosting. Pudding. Cookies. Cakes. Pies. Milk chocolate or white chocolate. Buttered syrups. Full-fat ice cream or ice cream drinks. The items listed above may not be a complete list of foods and beverages to avoid. Contact a dietitian for more information. Summary Heart-healthy meal planning includes limiting unhealthy fats, increasing healthy fats, limiting salt (sodium) intake and making other diet and lifestyle changes. Lose weight if you are overweight. Losing just 5-10% of your body weight can help your overall health and prevent diseases such as diabetes and heart disease. Focus on eating a balance of foods, including fruits and vegetables, low-fat or nonfat dairy, lean protein, nuts and legumes, whole grains, and heart-healthy oils and fats. This information is not intended to replace advice given to you by your health care provider. Make sure you discuss any questions you have with your health care provider. Document Revised: 02/18/2021 Document Reviewed: 02/18/2021 Elsevier Patient Education  [place]. Low-Sodium Eating Plan Sodium, which is an element that makes up salt, helps you maintain a healthy balance of fluids in your body. Too much sodium can increase your blood pressure and cause fluid and waste to be held in your body. Your health care provider or dietitian may recommend following this plan if you have high blood pressure (hypertension), kidney disease, liver disease, or heart failure. Eating less sodium can help lower your blood pressure, reduce swelling, and protect your heart, liver, and kidneys. What are tips for following this plan? Reading food labels The Nutrition Facts  label lists the amount of sodium in one serving of the food. If you eat more than one serving, you must multiply the listed amount of sodium by the number of servings. Choose foods with less than 140 mg of sodium per serving. Avoid foods with 300 mg of sodium or more per serving. Shopping  Look for lower-sodium products, often labeled as "low-sodium" or "no salt added." Always check the sodium content, even if foods are labeled as "unsalted" or "no salt added." Buy fresh foods. Avoid canned foods and pre-made or frozen meals. Avoid canned, cured, or processed meats. Buy breads that have less than 80 mg of sodium per slice. Cooking  Eat more home-cooked food and less restaurant, buffet, and fast food. Avoid adding salt when cooking. Use salt-free seasonings or herbs instead of table salt or sea salt. Check with your health care provider or pharmacist before using salt substitutes. Cook with plant-based oils, such as canola, sunflower, or olive oil. Meal planning When eating at a restaurant, ask that your food be prepared with less salt or no salt, if possible. Avoid dishes labeled as brined, pickled, cured, smoked, or made with soy sauce, miso, or teriyaki sauce. Avoid foods that contain MSG (monosodium glutamate). MSG is sometimes added to [place] food, bouillon, and some canned foods. Make meals  that can be grilled, baked, poached, roasted, or steamed. These are generally made with less sodium. General information Most people on this plan should limit their sodium intake to 1,500-2,000 mg (milligrams) of sodium each day. What foods should I eat? Fruits Fresh, frozen, or canned fruit. Fruit juice. Vegetables Fresh or frozen vegetables. "No salt added" canned vegetables. "No salt added" tomato sauce and paste. Low-sodium or reduced-sodium tomato and vegetable juice. Grains Low-sodium cereals, including oats, puffed wheat and rice, and shredded wheat. Low-sodium crackers. Unsalted rice.  Unsalted pasta. Low-sodium bread. Whole-grain breads and whole-grain pasta. Meats and other proteins Fresh or frozen (no salt added) meat, poultry, seafood, and fish. Low-sodium canned tuna and salmon. Unsalted nuts. Dried peas, beans, and lentils without added salt. Unsalted canned beans. Eggs. Unsalted nut butters. Dairy Milk. Soy milk. Cheese that is naturally low in sodium, such as ricotta cheese, fresh mozzarella, or Swiss cheese. Low-sodium or reduced-sodium cheese. Cream cheese. Yogurt. Seasonings and condiments Fresh and dried herbs and spices. Salt-free seasonings. Low-sodium mustard and ketchup. Sodium-free salad dressing. Sodium-free light mayonnaise. Fresh or refrigerated horseradish. Lemon juice. Vinegar. Other foods Homemade, reduced-sodium, or low-sodium soups. Unsalted popcorn and pretzels. Low-salt or salt-free chips. The items listed above may not be a complete list of foods and beverages you can eat. Contact a dietitian for more information. What foods should I avoid? Vegetables Sauerkraut, pickled vegetables, and relishes. Olives. [place] fries. Onion rings. Regular canned vegetables (not low-sodium or reduced-sodium). Regular canned tomato sauce and paste (not low-sodium or reduced-sodium). Regular tomato and vegetable juice (not low-sodium or reduced-sodium). Frozen vegetables in sauces. Grains Instant hot cereals. Bread stuffing, pancake, and biscuit mixes. Croutons. Seasoned rice or pasta mixes. Noodle soup cups. Boxed or frozen macaroni and cheese. Regular salted crackers. Self-rising flour. Meats and other proteins Meat or fish that is salted, canned, smoked, spiced, or pickled. Precooked or cured meat, such as sausages or meat loaves. Berniece Salines. Ham. Pepperoni. Hot dogs. Corned beef. Chipped beef. Salt pork. Jerky. Pickled herring. Anchovies and sardines. Regular canned tuna. Salted nuts. Dairy Processed cheese and cheese spreads. Hard cheeses. Cheese curds. Blue cheese. Feta  cheese. String cheese. Regular cottage cheese. Buttermilk. Canned milk. Fats and oils Salted butter. Regular margarine. Ghee. Bacon fat. Seasonings and condiments Onion salt, garlic salt, seasoned salt, table salt, and sea salt. Canned and packaged gravies. Worcestershire sauce. Tartar sauce. Barbecue sauce. Teriyaki sauce. Soy sauce, including reduced-sodium. Steak sauce. Fish sauce. Oyster sauce. Cocktail sauce. Horseradish that you find on the shelf. Regular ketchup and mustard. Meat flavorings and tenderizers. Bouillon cubes. Hot sauce. Pre-made or packaged marinades. Pre-made or packaged taco seasonings. Relishes. Regular salad dressings. Salsa. Other foods Salted popcorn and pretzels. Corn chips and puffs. Potato and tortilla chips. Canned or dried soups. Pizza. Frozen entrees and pot pies. The items listed above may not be a complete list of foods and beverages you should avoid. Contact a dietitian for more information. Summary Eating less sodium can help lower your blood pressure, reduce swelling, and protect your heart, liver, and kidneys. Most people on this plan should limit their sodium intake to 1,500-2,000 mg (milligrams) of sodium each day. Canned, boxed, and frozen foods are high in sodium. Restaurant foods, fast foods, and pizza are also very high in sodium. You also get sodium by adding salt to food. Try to cook at home, eat more fresh fruits and vegetables, and eat less fast food and canned, processed, or prepared foods. This information is not intended to replace advice  given to you by your health care provider. Make sure you discuss any questions you have with your health care provider. Document Revised: 12/20/2018 Document Reviewed: 12/15/2018 Elsevier Patient Education  [place]. Hypoglycemia Hypoglycemia occurs when the level of sugar (glucose) in the blood is too low. Hypoglycemia can happen in people who have or do not have diabetes. It can develop quickly, and it  can be a medical emergency. For most people, a blood glucose level below 70 mg/dL (3.9 mmol/L) is considered hypoglycemia. Glucose is a type of sugar that provides the body's main source of energy. Certain hormones (insulin and glucagon) control the level of glucose in the blood. Insulin lowers blood glucose, and glucagon raises blood glucose. Hypoglycemia can result from having too much insulin in the bloodstream, or from not eating enough food that contains glucose. You may also have reactive hypoglycemia, which happens within 4 hours after eating a meal. What are the causes? Hypoglycemia occurs most often in people who have diabetes and may be caused by: Diabetes medicine. Not eating enough, or not eating often enough. Increased physical activity. Drinking alcohol on an empty stomach. If you do not have diabetes, hypoglycemia may be caused by: A tumor in the pancreas. Not eating enough, or not eating for long periods at a time (fasting). A severe infection or illness. Problems after having bariatric surgery. Organ failure, such as kidney or liver failure. Certain medicines. What increases the risk? Hypoglycemia is more likely to develop in people who: Have diabetes and take medicines to lower blood glucose. Abuse alcohol. Have a severe illness. What are the signs or symptoms? Symptoms vary depending on whether the condition is mild, moderate, or severe. Mild hypoglycemia Hunger. Sweating and feeling clammy. Dizziness or feeling light-headed. Sleepiness or restless sleep. Nausea. Increased heart rate. Headache. Blurry vision. Mood changes, such as irritability or anxiety. Tingling or numbness around the mouth, lips, or tongue. Moderate hypoglycemia Confusion and poor judgment. Behavior changes. Weakness. Irregular heartbeat. A change in coordination. Severe hypoglycemia Severe hypoglycemia is a medical emergency. It can cause: Fainting. Seizures. Loss of consciousness  (coma). Death. How is this diagnosed? Hypoglycemia is diagnosed with a blood test to measure your blood glucose level. This blood test is done while you are having symptoms. Your health care provider may also do a physical exam and review your medical history. How is this treated? This condition can be treated by immediately eating or drinking something that contains sugar with 15 grams of fast-acting carbohydrate, such as: 4 oz (120 mL) of fruit juice. 4 oz (120 mL) of regular soda (not diet soda). Several pieces of hard candy. Check food labels to find out how many pieces to eat for 15 grams. 1 Tbsp (15 mL) of sugar or honey. 4 glucose tablets. 1 tube of glucose gel. Treating hypoglycemia if you have diabetes If you are alert and able to swallow safely, follow the 15:15 rule: Take 15 grams of a fast-acting carbohydrate. Talk with your health care provider about how much you should take. Options for getting 15 grams of fast-acting carbohydrate include: Glucose tablets (take 4 tablets). Several pieces of hard candy. Check food labels to find out how many pieces to eat for 15 grams. 4 oz (120 mL) of fruit juice. 4 oz (120 mL) of regular soda (not diet soda). 1 Tbsp (15 mL) of sugar or honey. 1 tube of glucose gel. Check your blood glucose 15 minutes after you take the carbohydrate. If the repeat blood glucose  level is still at or below 70 mg/dL (3.9 mmol/L), take 15 grams of a carbohydrate again. If your blood glucose level does not increase above 70 mg/dL (3.9 mmol/L) after 3 tries, seek emergency medical care. After your blood glucose level returns to normal, eat a meal or a snack within 1 hour.  Treating severe hypoglycemia Severe hypoglycemia is when your blood glucose level is below 54 mg/dL (3 mmol/L). Severe hypoglycemia is a medical emergency. Get medical help right away. If you have severe hypoglycemia and you cannot eat or drink, you will need to be given glucagon. A family  member or close friend should learn how to check your blood glucose and how to give you glucagon. Ask your health care provider if you need to have an emergency glucagon kit available. Severe hypoglycemia may need to be treated in a hospital. The treatment may include getting glucose through an IV. You may also need treatment for the cause of your hypoglycemia. Follow these instructions at home:  General instructions Take over-the-counter and prescription medicines only as told by your health care provider. Monitor your blood glucose as told by your health care provider. If you drink alcohol: Limit how much you have to: 0-1 drink a day for women who are not pregnant. 0-2 drinks a day for men. Know how much alcohol is in your drink. In the [place] one drink equals one 12 oz bottle of beer (355 mL), one 5 oz glass of wine (148 mL), or one 1 oz glass of hard liquor (44 mL). Be sure to eat food along with drinking alcohol. Be aware that alcohol is absorbed quickly and may have lingering effects that may result in hypoglycemia later. Be sure to do ongoing glucose monitoring. Keep all follow-up visits. This is important. If you have diabetes: Always have a fast-acting carbohydrate (15 grams) option with you to treat low blood glucose. Follow your diabetes management plan as directed by your health care provider. Make sure you: Know the symptoms of hypoglycemia. It is important to treat it right away to prevent it from becoming severe. Check your blood glucose as often as told. Always check before and after exercise. Always check your blood glucose before you drive a motorized vehicle. Take your medicines as told. Follow your meal plan. Eat on time, and do not skip meals. Share your diabetes management plan with people in your workplace, school, and household. Carry a medical alert card or wear medical alert jewelry. Where to find more information American Diabetes Association:  www.diabetes.org Contact a health care provider if: You have problems keeping your blood glucose in your target range. You have frequent episodes of hypoglycemia. Get help right away if: You continue to have hypoglycemia symptoms after eating or drinking something that contains 15 grams of fast-acting carbohydrate, and you cannot get your blood glucose above 70 mg/dL (3.9 mmol/L) while following the 15:15 rule. Your blood glucose is below 54 mg/dL (3 mmol/L). You have a seizure. You faint. These symptoms may represent a serious problem that is an emergency. Do not wait to see if the symptoms will go away. Get medical help right away. Call your local emergency services (911 in the [place] Do not drive yourself to the hospital. Summary Hypoglycemia occurs when the level of sugar (glucose) in the blood is too low. Hypoglycemia can happen in people who have or do not have diabetes. It can develop quickly, and it can be a medical emergency. Make sure you know the symptoms of  hypoglycemia and how to treat it. Always have a fast-acting carbohydrate option with you to treat low blood sugar. This information is not intended to replace advice given to you by your health care provider. Make sure you discuss any questions you have with your health care provider. Document Revised: 12/15/2019 Document Reviewed: 12/15/2019 Elsevier Patient Education  [place].

## 2022-04-11 NOTE — Chronic Care Management (AMB) (Signed)
Chronic Care Management   CCM RN Visit Note  04/11/2022 Name: Candace Lopez MRN: [id number] DOB: 1950/09/07  Subjective: Candace Lopez is a 72 y.o. year old female who is a primary care patient of Janora Norlander, DO. The patient was referred to the Chronic Care Management team for assistance with care management needs subsequent to provider initiation of CCM services and plan of care.    Today's Visit:  Engaged with patient by telephone for initial visit.     SDOH Interventions Today    Flowsheet Row Most Recent Value  SDOH Interventions   Food Insecurity Interventions Intervention Not Indicated  Housing Interventions Intervention Not Indicated  Transportation Interventions Intervention Not Indicated  Utilities Interventions Intervention Not Indicated  Financial Strain Interventions Intervention Not Indicated  Physical Activity Interventions Intervention Not Indicated  Stress Interventions Intervention Not Indicated  Social Connections Interventions Intervention Not Indicated         Goals Addressed             This Visit's Progress    CCM (DIABETES) EXPECTED OUTCOME: MONITOR, SELF- MANAGE AND REDUCE SYMPTOMS OF DIABETES       Current Barriers:  Knowledge Deficits related to Diabetes management Care Coordination needs related to pharmacy needs, medication assistance in a patient with Diabetes Chronic Disease Management support and education needs related to Diabetes No Advanced Directives in place- pt requests documents to be mailed Patient reports she checks CBG once daily fasting with readings 140-187, states this is an improvement because readings were near 200 most of the time Patient reports she does not drink sugary drinks, usually drinks water Pharmacist will be working with pt for medication assistance Patient reports she has all medications and taking as prescribed  Planned Interventions: Provided education to patient about basic DM disease  process; Reviewed medications with patient and discussed importance of medication adherence;        Reviewed prescribed diet with patient carbohydrate modified; Counseled on importance of regular laboratory monitoring as prescribed;        Discussed plans with patient for ongoing care management follow up and provided patient with direct contact information for care management team;      Provided patient with written educational materials related to hypo and hyperglycemia and importance of correct treatment;       Advised patient, providing education and rationale, to check cbg once daily and record        call provider for findings outside established parameters;       Review of patient status, including review of consultants reports, relevant laboratory and other test results, and medications completed;       Screening for signs and symptoms of depression related to chronic disease state;        Assessed social determinant of health barriers;        Advanced directives packet mailed  Symptom Management: Take medications as prescribed   Attend all scheduled provider appointments Call pharmacy for medication refills 3-7 days in advance of running out of medications Attend church or other social activities Perform all self care activities independently  Perform IADL's (shopping, preparing meals, housekeeping, managing finances) independently Call provider office for new concerns or questions  check blood sugar at prescribed times: once daily check feet daily for cuts, sores or redness enter blood sugar readings and medication or insulin into daily log take the blood sugar log to all doctor visits take the blood sugar meter to all doctor visits trim toenails straight  across fill half of plate with vegetables limit fast food meals to no more than 1 per week manage portion size read food labels for fat, fiber, carbohydrates and portion size wash and dry feet carefully every day wear  comfortable, cotton socks Look over education mailed- hypoglycemia Advanced directives mailed- please look over and complete  Follow Up Plan: Telephone follow up appointment with care management team member scheduled for:  06/16/22 at 215 pm       CCM (HYPERLIPIDEMIA) EXPECTED OUTCOME: MONITOR, SELF-MANAGE AND REDUCE SYMPTOMS OF HYPERLIPIDEMIA       Current Barriers:  Knowledge Deficits related to Hyperlipidemia management Chronic Disease Management support and education needs related to Hyperlipidemia, diet  Planned Interventions: Provider established cholesterol goals reviewed; Counseled on importance of regular laboratory monitoring as prescribed; Provided HLD educational materials; Reviewed importance of limiting foods high in cholesterol; Reviewed exercise goals and target of 150 minutes per week; Screening for signs and symptoms of depression related to chronic disease state;  Assessed social determinant of health barriers;   Symptom Management: Take medications as prescribed   Attend all scheduled provider appointments Call pharmacy for medication refills 3-7 days in advance of running out of medications Attend church or other social activities Perform all self care activities independently  Perform IADL's (shopping, preparing meals, housekeeping, managing finances) independently Call provider office for new concerns or questions  - call doctor with any symptoms you believe are related to your medicine - call doctor when you experience any new symptoms - go to all doctor appointments as scheduled - adhere to prescribed diet: heart healthy Continue working out at the gym Look over education mailed- heart healthy diet  Follow Up Plan: Telephone follow up appointment with care management team member scheduled for:  06/16/22 at 215 pm       CCM (HYPERTENSION) EXPECTED OUTCOME: MONITOR, SELF-MANAGE AND REDUCE SYMPTOMS OF HYPERTENSION       Current Barriers:  Knowledge  Deficits related to Hypertension management Chronic Disease Management support and education needs related to Hypertension, diet No Advanced Directives in place- pt requests information be mailed Patient reports she lives with spouse and assists with his care due to he is in poor health, patient states she can call on her daughter at any time for assistance if needed, reports independent with all aspects of her care. Patient reports she has blood pressure cuff but does not monitor blood pressure Patient reports she is getting back into exercising and trying to be more consistent, goes to gym to workout Patient reports 2 falls in the past year and sprained her ankle with one of the falls  Planned Interventions: Evaluation of current treatment plan related to hypertension self management and patient's adherence to plan as established by provider;   Reviewed prescribed diet low sodium Reviewed medications with patient and discussed importance of compliance;  Counseled on the importance of exercise goals with target of 150 minutes per week Discussed plans with patient for ongoing care management follow up and provided patient with direct contact information for care management team; Advised patient, providing education and rationale, to monitor blood pressure daily and record, calling PCP for findings outside established parameters;  Provided education on prescribed diet low sodium;  Discussed complications of poorly controlled blood pressure such as heart disease, stroke, circulatory complications, vision complications, kidney impairment, sexual dysfunction;  Screening for signs and symptoms of depression related to chronic disease state;  Assessed social determinant of health barriers;  Reviewed upcoming appointments including telephone  call with pharmacist on 05/09/22 Reviewed safety precautions  Symptom Management: Take medications as prescribed   Attend all scheduled provider  appointments Call pharmacy for medication refills 3-7 days in advance of running out of medications Attend church or other social activities Perform all self care activities independently  Perform IADL's (shopping, preparing meals, housekeeping, managing finances) independently Call provider office for new concerns or questions  check blood pressure weekly choose a place to take my blood pressure (home, clinic or office, retail store) write blood pressure results in a log or diary learn about high blood pressure keep a blood pressure log take blood pressure log to all doctor appointments keep all doctor appointments take medications for blood pressure exactly as prescribed report new symptoms to your doctor eat more whole grains, fruits and vegetables, lean meats and healthy fats Look over education mailed- low sodium diet fall prevention strategies: change position slowly, use assistive device such as walker or cane (per provider recommendations) when walking, keep walkways clear, have good lighting in room. It is important to contact your provider if you have any falls, maintain muscle strength/tone by exercise per provider recommendations.  Follow Up Plan: Telephone follow up appointment with care management team member scheduled for:  06/16/22 at 215 pm          Plan:Telephone follow up appointment with care management team member scheduled for:  06/16/22 at 215 pm  Jacqlyn Larsen [facility], BSN RN Case Manager [place] [phone]

## 2022-04-27 DIAGNOSIS — E1159 Type 2 diabetes mellitus with other circulatory complications: Secondary | ICD-10-CM | POA: Diagnosis not present

## 2022-04-27 DIAGNOSIS — Z7984 Long term (current) use of oral hypoglycemic drugs: Secondary | ICD-10-CM | POA: Diagnosis not present

## 2022-04-27 DIAGNOSIS — I10 Essential (primary) hypertension: Secondary | ICD-10-CM

## 2022-04-27 DIAGNOSIS — E785 Hyperlipidemia, unspecified: Secondary | ICD-10-CM | POA: Diagnosis not present

## 2022-05-01 ENCOUNTER — Telehealth: Payer: Self-pay | Admitting: Family Medicine

## 2022-05-03 ENCOUNTER — Other Ambulatory Visit: Payer: Self-pay | Admitting: Family Medicine

## 2022-05-03 DIAGNOSIS — G2581 Restless legs syndrome: Secondary | ICD-10-CM

## 2022-05-03 DIAGNOSIS — E1159 Type 2 diabetes mellitus with other circulatory complications: Secondary | ICD-10-CM

## 2022-05-05 NOTE — Telephone Encounter (Signed)
This was completed last week.  Not sure if her ins person came and picked it up or not. Did this come to you, Lynden Ang?

## 2022-05-09 ENCOUNTER — Telehealth: Payer: Medicare HMO

## 2022-05-13 ENCOUNTER — Telehealth: Payer: Self-pay | Admitting: Family Medicine

## 2022-05-13 NOTE — Telephone Encounter (Signed)
Contacted Candace Lopez to schedule their annual wellness visit. Appointment made for 05/21/2022.  Thank you,  [name],  AMB Clinical Support [facility] AW[facility] Program Direct Dial ??[phone]

## 2022-05-20 NOTE — Patient Instructions (Incomplete)
Candace Lopez , Thank you for taking time to come for your Medicare Wellness Visit. I appreciate your ongoing commitment to your health goals. Please review the following plan we discussed and let me know if I can assist you in the future.   These are the goals we discussed:  Goals      Remain active and independent        This is a list of the screening recommended for you and due dates:  Health Maintenance  Topic Date Due   COVID-19 Vaccine (5 - 2023-24 season) 09/27/2021   Eye exam for diabetics  12/14/2021   Zoster (Shingles) Vaccine (1 of 2) 07/05/2022*   DEXA scan (bone density measurement)  12/27/2022*   DTaP/Tdap/Td vaccine (2 - Td or Tdap) 07/17/2022   Flu Shot  08/28/2022   Hemoglobin A1C  10/05/2022   Yearly kidney function blood test for diabetes  12/31/2022   Yearly kidney health urinalysis for diabetes  01/02/2023   Complete foot exam   01/02/2023   Medicare Annual Wellness Visit  05/21/2023   Mammogram  10/12/2023   Colon Cancer Screening  09/21/2028   Pneumonia Vaccine  Completed   Hepatitis C Screening: USPSTF Recommendation to screen - Ages 18-79 yo.  Completed   HPV Vaccine  Aged Out  *Topic was postponed. The date shown is not the original due date.    Advanced directives: Forms are available if you choose in the future to pursue completion.  This is recommended in order to make sure that your health wishes are honored in the event that you are unable to verbalize them to the provider.    Conditions/risks identified: Aim for 30 minutes of exercise or brisk walking, 6-8 glasses of water, and 5 servings of fruits and vegetables each day.  Next appointment: Follow up in one year for your annual wellness visit   The number to schedule your mammogram at [facility] is [phone]   Preventive Care 65 Years and Older, Female Preventive care refers to lifestyle choices and visits with your health care provider that can promote health and wellness. What does  preventive care include? A yearly physical exam. This is also called an annual well check. Dental exams once or twice a year. Routine eye exams. Ask your health care provider how often you should have your eyes checked. Personal lifestyle choices, including: Daily care of your teeth and gums. Regular physical activity. Eating a healthy diet. Avoiding tobacco and drug use. Limiting alcohol use. Practicing safe sex. Taking low-dose aspirin every day. Taking vitamin and mineral supplements as recommended by your health care provider. What happens during an annual well check? The services and screenings done by your health care provider during your annual well check will depend on your age, overall health, lifestyle risk factors, and family history of disease. Counseling  Your health care provider may ask you questions about your: Alcohol use. Tobacco use. Drug use. Emotional well-being. Home and relationship well-being. Sexual activity. Eating habits. History of falls. Memory and ability to understand (cognition). Work and work Astronomer. Reproductive health. Screening  You may have the following tests or measurements: Height, weight, and BMI. Blood pressure. Lipid and cholesterol levels. These may be checked every 5 years, or more frequently if you are over 30 years old. Skin check. Lung cancer screening. You may have this screening every year starting at age 13 if you have a 30-pack-year history of smoking and currently smoke or have quit within the past 15  years. Fecal occult blood test (FOBT) of the stool. You may have this test every year starting at age 68. Flexible sigmoidoscopy or colonoscopy. You may have a sigmoidoscopy every 5 years or a colonoscopy every 10 years starting at age 50. Hepatitis C blood test. Hepatitis B blood test. Sexually transmitted disease (STD) testing. Diabetes screening. This is done by checking your blood sugar (glucose) after you have not  eaten for a while (fasting). You may have this done every 1-3 years. Bone density scan. This is done to screen for osteoporosis. You may have this done starting at age 63. Mammogram. This may be done every 1-2 years. Talk to your health care provider about how often you should have regular mammograms. Talk with your health care provider about your test results, treatment options, and if necessary, the need for more tests. Vaccines  Your health care provider may recommend certain vaccines, such as: Influenza vaccine. This is recommended every year. Tetanus, diphtheria, and acellular pertussis (Tdap, Td) vaccine. You may need a Td booster every 10 years. Zoster vaccine. You may need this after age 63. Pneumococcal 13-valent conjugate (PCV13) vaccine. One dose is recommended after age 37. Pneumococcal polysaccharide (PPSV23) vaccine. One dose is recommended after age 90. Talk to your health care provider about which screenings and vaccines you need and how often you need them. This information is not intended to replace advice given to you by your health care provider. Make sure you discuss any questions you have with your health care provider. Document Released: 02/09/2015 Document Revised: 10/03/2015 Document Reviewed: 11/14/2014 Elsevier Interactive Patient Education  2017 [employer].  Fall Prevention in the Home Falls can cause injuries. They can happen to people of all ages. There are many things you can do to make your home safe and to help prevent falls. What can I do on the outside of my home? Regularly fix the edges of walkways and driveways and fix any cracks. Remove anything that might make you trip as you walk through a door, such as a raised step or threshold. Trim any bushes or trees on the path to your home. Use bright outdoor lighting. Clear any walking paths of anything that might make someone trip, such as rocks or tools. Regularly check to see if handrails are loose or  broken. Make sure that both sides of any steps have handrails. Any raised decks and porches should have guardrails on the edges. Have any leaves, snow, or ice cleared regularly. Use sand or salt on walking paths during winter. Clean up any spills in your garage right away. This includes oil or grease spills. What can I do in the bathroom? Use night lights. Install grab bars by the toilet and in the tub and shower. Do not use towel bars as grab bars. Use non-skid mats or decals in the tub or shower. If you need to sit down in the shower, use a plastic, non-slip stool. Keep the floor dry. Clean up any water that spills on the floor as soon as it happens. Remove soap buildup in the tub or shower regularly. Attach bath mats securely with double-sided non-slip rug tape. Do not have throw rugs and other things on the floor that can make you trip. What can I do in the bedroom? Use night lights. Make sure that you have a light by your bed that is easy to reach. Do not use any sheets or blankets that are too big for your bed. They should not hang down  onto the floor. Have a firm chair that has side arms. You can use this for support while you get dressed. Do not have throw rugs and other things on the floor that can make you trip. What can I do in the kitchen? Clean up any spills right away. Avoid walking on wet floors. Keep items that you use a lot in easy-to-reach places. If you need to reach something above you, use a strong step stool that has a grab bar. Keep electrical cords out of the way. Do not use floor polish or wax that makes floors slippery. If you must use wax, use non-skid floor wax. Do not have throw rugs and other things on the floor that can make you trip. What can I do with my stairs? Do not leave any items on the stairs. Make sure that there are handrails on both sides of the stairs and use them. Fix handrails that are broken or loose. Make sure that handrails are as long as  the stairways. Check any carpeting to make sure that it is firmly attached to the stairs. Fix any carpet that is loose or worn. Avoid having throw rugs at the top or bottom of the stairs. If you do have throw rugs, attach them to the floor with carpet tape. Make sure that you have a light switch at the top of the stairs and the bottom of the stairs. If you do not have them, ask someone to add them for you. What else can I do to help prevent falls? Wear shoes that: Do not have high heels. Have rubber bottoms. Are comfortable and fit you well. Are closed at the toe. Do not wear sandals. If you use a stepladder: Make sure that it is fully opened. Do not climb a closed stepladder. Make sure that both sides of the stepladder are locked into place. Ask someone to hold it for you, if possible. Clearly mark and make sure that you can see: Any grab bars or handrails. First and last steps. Where the edge of each step is. Use tools that help you move around (mobility aids) if they are needed. These include: Canes. Walkers. Scooters. Crutches. Turn on the lights when you go into a dark area. Replace any light bulbs as soon as they burn out. Set up your furniture so you have a clear path. Avoid moving your furniture around. If any of your floors are uneven, fix them. If there are any pets around you, be aware of where they are. Review your medicines with your doctor. Some medicines can make you feel dizzy. This can increase your chance of falling. Ask your doctor what other things that you can do to help prevent falls. This information is not intended to replace advice given to you by your health care provider. Make sure you discuss any questions you have with your health care provider. Document Released: 11/09/2008 Document Revised: 06/21/2015 Document Reviewed: 02/17/2014 Elsevier Interactive Patient Education  2017 [employer].

## 2022-05-20 NOTE — Progress Notes (Unsigned)
Subjective:   Candace Lopez is a 72 y.o. female who presents for Medicare Annual (Subsequent) preventive examination.  Review of Systems    ***       Objective:    There were no vitals filed for this visit. There is no height or weight on file to calculate BMI.     04/11/2022    9:58 AM 09/09/2021    1:38 PM 09/06/2020    2:35 PM 09/22/2018    9:28 AM  Advanced Directives  Does Patient Have a Medical Advance Directive? No No No No  Would patient like information on creating a medical advance directive? Yes (MAU/Ambulatory/Procedural Areas - Information given) No - Patient declined No - Patient declined No - Patient declined    Current Medications (verified) Outpatient Encounter Medications as of 05/21/2022  Medication Sig   Alpha-Lipoic Acid 600 MG CAPS Take 1 capsule (600 mg total) by mouth daily. For diabetic neuropathy   aspirin EC 81 MG tablet Take 81 mg by mouth in the morning and at bedtime. Swallow whole.   atorvastatin (LIPITOR) 40 MG tablet TAKE 1 TABLET BY MOUTH EVERYDAY AT BEDTIME   Blood Glucose Monitoring Suppl (ONE TOUCH ULTRA 2) w/Device KIT UAD to check BGs E11.9   glipiZIDE (GLUCOTROL XL) 5 MG 24 hr tablet Take 1 tablet (5 mg total) by mouth daily with breakfast. For fasting sugar >200.   lisinopril (ZESTRIL) 10 MG tablet TAKE 1 TABLET (10 MG TOTAL) BY MOUTH IN THE MORNING AND AT BEDTIME. (STOP TAKING LISINOPRIL 20MG )   metFORMIN (GLUCOPHAGE-XR) 500 MG 24 hr tablet TAKE 2 TABLETS BY MOUTH EVERY DAY WITH BREAKFAST   metoprolol tartrate (LOPRESSOR) 25 MG tablet TAKE 1 TABLET BY MOUTH TWICE A DAY   OneTouch Delica Lancets 30G MISC Check BS BID Dx E11.40   ONETOUCH ULTRA test strip CHECK BLOOD SUGAR TWICE DAILY DX 11.40   pramipexole (MIRAPEX) 0.25 MG tablet TAKE 1 TABLET 2 HOURS BEFORE BEDTIME FOR RESTLESS LEGS   Semaglutide,0.25 or 0.5MG /DOS, (OZEMPIC, 0.25 OR 0.5 MG/DOSE,) 2 MG/3ML SOPN Inject 0.5 mg into the skin every 7 (seven) days.   TURMERIC PO Take 1  capsule by mouth 2 (two) times daily.    zolpidem (AMBIEN) 5 MG tablet take 1 tablet(5 mg total) by mouth at bedtime as needed for sleep.   No facility-administered encounter medications on file as of 05/21/2022.    Allergies (verified) Tape   History: Past Medical History:  Diagnosis Date   DDD (degenerative disc disease), lumbar    Diabetes mellitus without complication (HCC)    GERD (gastroesophageal reflux disease)    Hyperlipidemia    Hypertension    Urge incontinence of urine    Past Surgical History:  Procedure Laterality Date   ABDOMINAL HYSTERECTOMY     arm surgery     COLONOSCOPY N/A 09/22/2018   Procedure: COLONOSCOPY;  Surgeon: Malissa Hippo, MD;  Location: [facility];  Service: Endoscopy;  Laterality: N/A;  830   FOOT SURGERY     KNEE SURGERY     Family History  Problem Relation Age of Onset   Cancer Mother        lung cancer   Cancer Father        lymphnodes   Diabetes Sister    Lung cancer Sister    Diabetes Brother    Cancer Maternal Grandfather    Heart disease Paternal Grandfather    Diabetes Son    Cancer Maternal Aunt  throat   Cancer Paternal Uncle        unknown    Cancer Other        breast   Colon cancer Neg Hx    Social History   Socioeconomic History   Marital status: Married    Spouse name: Jimmy   Number of children: 2   Years of education: Not on file   Highest education level: Not on file  Occupational History   Occupation: retired  Tobacco Use   Smoking status: Never   Smokeless tobacco: Never  Vaping Use   Vaping Use: Never used  Substance and Sexual Activity   Alcohol use: No   Drug use: No   Sexual activity: Not on file  Other Topics Concern   Not on file  Social History Narrative   2 children  - son lives in [place], daughter 15 minutes away.   09/09/21 - She is having to stay home more to take care of husband who is not in good health   Social Determinants of Health   Financial Resource Strain:  Low Risk  (04/11/2022)   Overall Financial Resource Strain (CARDIA)    Difficulty of Paying Living Expenses: Not hard at all  Food Insecurity: No Food Insecurity (04/11/2022)   Hunger Vital Sign    Worried About Running Out of Food in the Last Year: Never true    Ran Out of Food in the Last Year: Never true  Transportation Needs: No Transportation Needs (04/11/2022)   PRAPARE - Administrator, Civil Service (Medical): No    Lack of Transportation (Non-Medical): No  Physical Activity: Insufficiently Active (04/11/2022)   Exercise Vital Sign    Days of Exercise per Week: 3 days    Minutes of Exercise per Session: 40 min  Stress: No Stress Concern Present (04/11/2022)   [employer] of Occupational Health - Occupational Stress Questionnaire    Feeling of Stress : Not at all  Social Connections: Moderately Isolated (04/11/2022)   Social Connection and Isolation Panel [NHANES]    Frequency of Communication with Friends and Family: More than three times a week    Frequency of Social Gatherings with Friends and Family: More than three times a week    Attends Religious Services: Never    Database administrator or Organizations: No    Attends Banker Meetings: Never    Marital Status: Married    Tobacco Counseling Counseling given: Not Answered   Clinical Intake:                 Diabetic?Yes   Nutrition Risk Assessment:  Has the patient had any N/V/D within the last 2 months?  {YES/NO:21197} Does the patient have any non-healing wounds?  {YES/NO:21197} Has the patient had any unintentional weight loss or weight gain?  {YES/NO:21197}  Diabetes:  Is the patient diabetic?  {YES/NO:21197} If diabetic, was a CBG obtained today?  {YES/NO:21197} Did the patient bring in their glucometer from home?  {YES/NO:21197} How often do you monitor your CBG's? ***.   Financial Strains and Diabetes Management:  Are you having any financial strains with the  device, your supplies or your medication? {YES/NO:21197}.  Does the patient want to be seen by Chronic Care Management for management of their diabetes?  {YES/NO:21197} Would the patient like to be referred to a Nutritionist or for Diabetic Management?  {YES/NO:21197}  Diabetic Exams:  {Diabetic Eye Exam:2101801} Diabetic Foot Exam: Completed 01/01/22  Activities of Daily Living    09/09/2021    1:38 PM  In your present state of health, do you have any difficulty performing the following activities:  Hearing? 0  Vision? 0  Difficulty concentrating or making decisions? 0  Walking or climbing stairs? 0  Dressing or bathing? 0  Doing errands, shopping? 0  Preparing Food and eating ? N  Using the Toilet? N  In the past six months, have you accidently leaked urine? Y  Do you have problems with loss of bowel control? N  Managing your Medications? N  Managing your Finances? N  Housekeeping or managing your Housekeeping? N    Patient Care Team: [name], DO as PCP - General (Family Medicine) Michaelle Copas, MD as Referring Physician (Optometry) Cresenciano Genre Lilla Shook, [facility] as Pharmacist (Family Medicine) Audrie Gallus, RN as [facility] Care Management  Indicate any recent Medical Services you may have received from other than [facility] providers in the past year (date may be approximate).     Assessment:   This is a routine wellness examination for Candace Lopez.  Hearing/Vision screen No results found.  Dietary issues and exercise activities discussed:     Goals Addressed   None    Depression Screen    04/11/2022    9:55 AM 04/04/2022    8:27 AM 01/01/2022    8:01 AM 09/09/2021    1:39 PM 07/01/2021   10:52 AM 06/03/2021    4:11 PM 04/23/2021    9:37 AM  PHQ 2/9 Scores  PHQ - 2 Score 0 0 0 0 0 0 0  PHQ- 9 Score  0   0 2 3    Fall Risk    04/11/2022    9:58 AM 01/01/2022    8:01 AM 09/09/2021    1:35 PM 07/01/2021   10:52 AM 06/03/2021    4:11 PM   Fall Risk   Falls in the past year? 1 0 1 0 0  Number falls in past yr: 1  0    Injury with Fall? 1  0    Comment ankle sprain      Risk for fall due to : History of fall(s)  Orthopedic patient;History of fall(s)    Follow up Falls evaluation completed;Education provided;Falls prevention discussed  Falls prevention discussed;Education provided      FALL RISK PREVENTION PERTAINING TO THE HOME:  Any stairs in or around the home? {YES/NO:21197} If so, are there any without handrails? {YES/NO:21197} Home free of loose throw rugs in walkways, pet beds, electrical cords, etc? {YES/NO:21197} Adequate lighting in your home to reduce risk of falls? {YES/NO:21197}  ASSISTIVE DEVICES UTILIZED TO PREVENT FALLS:  Life alert? {YES/NO:21197} Use of a cane, walker or w/c? {YES/NO:21197} Grab bars in the bathroom? {YES/NO:21197} Shower chair or bench in shower? {YES/NO:21197} Elevated toilet seat or a handicapped toilet? {YES/NO:21197}  TIMED UP AND GO:  Was the test performed? No . Telephonic visit   Cognitive Function:    08/26/2017    2:07 PM  MMSE - Mini Mental State Exam  Orientation to time 5  Orientation to Place 5  Registration 3  Attention/ Calculation 5  Recall 2  Language- name 2 objects 2  Language- repeat 1  Language- follow 3 step command 3  Language- read & follow direction 1  Write a sentence 1  Copy design 1  Total score 29        09/09/2021    1:42 PM 09/06/2020  2:29 PM  6CIT Screen  What Year? 0 points 0 points  What month? 0 points 0 points  What time? 0 points 0 points  Count back from 20 0 points 0 points  Months in reverse 0 points 2 points  Repeat phrase 2 points 4 points  Total Score 2 points 6 points    Immunizations Immunization History  Administered Date(s) Administered   Fluad Quad(high Dose 65+) 10/07/2018, 10/31/2019, 11/05/2020, 12/03/2021   Influenza, High Dose Seasonal PF 12/29/2016, 10/21/2017   Moderna Covid-19 Vaccine Bivalent  Booster 53yrs & up 03/07/2021   Moderna Sars-Covid-2 Vaccination 04/26/2019, 05/24/2019, 01/24/2020   Pneumococcal Conjugate-13 08/26/2017   Pneumococcal Polysaccharide-23 12/13/2019   Tdap 07/16/2012    TDAP status: Up to date  Flu Vaccine status: Up to date  Pneumococcal vaccine status: Up to date  Covid-19 vaccine status: Information provided on how to obtain vaccines.   Qualifies for Shingles Vaccine? Yes   Zostavax completed No   Shingrix Completed?: No.    Education has been provided regarding the importance of this vaccine. Patient has been advised to call insurance company to determine out of pocket expense if they have not yet received this vaccine. Advised may also receive vaccine at local pharmacy or Health Dept. Verbalized acceptance and understanding.  Screening Tests Health Maintenance  Topic Date Due   COVID-19 Vaccine (5 - 2023-24 season) 09/27/2021   OPHTHALMOLOGY EXAM  12/14/2021   Zoster Vaccines- Shingrix (1 of 2) 07/05/2022 (Originally 09/01/1969)   DEXA SCAN  12/27/2022 (Originally 12/19/2021)   DTaP/Tdap/Td (2 - Td or Tdap) 07/17/2022   INFLUENZA VACCINE  08/28/2022   Medicare Annual Wellness (AWV)  09/10/2022   HEMOGLOBIN A1C  10/05/2022   Diabetic kidney evaluation - eGFR measurement  12/31/2022   Diabetic kidney evaluation - Urine ACR  01/02/2023   FOOT EXAM  01/02/2023   MAMMOGRAM  10/12/2023   COLONOSCOPY (Pts 45-12yrs Insurance coverage will need to be confirmed)  09/21/2028   Pneumonia Vaccine 108+ Years old  Completed   Hepatitis C Screening  Completed   HPV VACCINES  Aged Out    Health Maintenance  Health Maintenance Due  Topic Date Due   COVID-19 Vaccine (5 - 2023-24 season) 09/27/2021   OPHTHALMOLOGY EXAM  12/14/2021    Colorectal cancer screening: Type of screening: Colonoscopy. Completed 09/22/18. Repeat every 10 years  Mammogram status: Completed 10/11/21. Repeat every year  {Bone Density status:[id number]}  Lung Cancer Screening:  (Low Dose CT Chest recommended if Age 38-80 years, 30 pack-year currently smoking OR have quit w/in 15years.) does not qualify.   Lung Cancer Screening Referral: n/a  Additional Screening:  Hepatitis C Screening: does qualify; Completed 10/07/18  Vision Screening: Recommended annual ophthalmology exams for early detection of glaucoma and other disorders of the eye. Is the patient up to date with their annual eye exam?  {YES/NO:21197} Who is the provider or what is the name of the office in which the patient attends annual eye exams? *** If pt is not established with a provider, would they like to be referred to a provider to establish care? {YES/NO:21197}.   Dental Screening: Recommended annual dental exams for proper oral hygiene  Community Resource Referral / Chronic Care Management: CRR required this visit?  {YES/NO:21197}  CCM required this visit?  {YES/NO:21197}     Plan:     I have personally reviewed and noted the following in the patient's chart:   Medical and social history Use of alcohol, tobacco or illicit drugs  Current medications and supplements including opioid prescriptions. {Opioid Prescriptions:[phone]} Functional ability and status Nutritional status Physical activity Advanced directives List of other physicians Hospitalizations, surgeries, and ER visits in previous 12 months Vitals Screenings to include cognitive, depression, and falls Referrals and appointments  In addition, I have reviewed and discussed with patient certain preventive protocols, quality metrics, and best practice recommendations. A written personalized care plan for preventive services as well as general preventive health recommendations were provided to patient.     Durwin Nora, [place]   1/61/0960   Due to this being a virtual visit, the after visit summary with patients personalized plan was offered to patient via mail or my-chart. ***Patient declined at this time./  Patient would like to access on my-chart/ per request, patient was mailed a copy of AVS./ Patient preferred to pick up at office at next visit  Nurse Notes: ***

## 2022-05-21 ENCOUNTER — Ambulatory Visit (INDEPENDENT_AMBULATORY_CARE_PROVIDER_SITE_OTHER): Payer: Medicare HMO

## 2022-05-21 VITALS — Ht 65.0 in | Wt 170.0 lb

## 2022-05-21 DIAGNOSIS — Z1231 Encounter for screening mammogram for malignant neoplasm of breast: Secondary | ICD-10-CM

## 2022-05-21 DIAGNOSIS — Z780 Asymptomatic menopausal state: Secondary | ICD-10-CM

## 2022-05-21 DIAGNOSIS — Z0000 Encounter for general adult medical examination without abnormal findings: Secondary | ICD-10-CM | POA: Diagnosis not present

## 2022-05-30 ENCOUNTER — Ambulatory Visit (INDEPENDENT_AMBULATORY_CARE_PROVIDER_SITE_OTHER): Payer: Medicare HMO | Admitting: Pharmacist

## 2022-05-30 DIAGNOSIS — Z7985 Long-term (current) use of injectable non-insulin antidiabetic drugs: Secondary | ICD-10-CM

## 2022-05-30 DIAGNOSIS — E1140 Type 2 diabetes mellitus with diabetic neuropathy, unspecified: Secondary | ICD-10-CM | POA: Diagnosis not present

## 2022-05-30 DIAGNOSIS — Z7984 Long term (current) use of oral hypoglycemic drugs: Secondary | ICD-10-CM

## 2022-05-30 NOTE — Progress Notes (Signed)
05/30/2022 Name: Candace Lopez MRN: [id number] DOB: [birth year]  Chief Complaint  Patient presents with   Diabetes   Subjective:  Care Team: Primary Care Provider: [name], DO ; Next Scheduled Visit: 06/2022   Medication Access/Adherence  Current Pharmacy:  [facility] [phone] - [place], [place] - [address] [place] [address] Phone: [phone] Fax: [phone]  [facility] - [place], [place] - [address] [place] [address] Phone: [phone] Fax: [phone]   Patient reports affordability concerns with their medications: Yes  Patient reports access/transportation concerns to their pharmacy: No  Patient reports adherence concerns with their medications:  No     Diabetes:  Current medications: Ozempic, metformin, glipizide Medications tried in the past: Onglyza, Tradjenta  Current glucose readings: 102-120 on Ozempic, now higher up to 170 since she has been off medicine for 2+ weeks Using one touch ultra meter; testing 1 time daily  -Patient is testing blood sugar 6 times daily -Patient is injecting insulin 4 or more times daily -He would greatly benefit from a continuous glucose monitoring system (I.e. libre or dexcom)  Patient denies hypoglycemic s/sx including dizziness, shakiness, sweating. Patient denies hyperglycemic symptoms including polyuria, polydipsia, polyphagia, nocturia, neuropathy, blurred vision.  Current physical activity: encouraged  Current medication access support: enrolling in [employer] PAP   Objective:  Lab Results  Component Value Date   HGBA1C 7.7 (H) 04/04/2022    Lab Results  Component Value Date   CREATININE 0.91 12/30/2021   BUN 13 12/30/2021   NA 134 12/30/2021   K 5.1 12/30/2021   CL 100 12/30/2021   CO2 20 12/30/2021    Lab Results  Component Value Date   CHOL 131 04/04/2022   HDL 30 (L) 04/04/2022   LDLCALC 67 04/04/2022   LDLDIRECT 67  12/13/2019   TRIG 202 (H) 04/04/2022   CHOLHDL 4.4 04/04/2022    Medications Reviewed Today     Reviewed by Danella Maiers, [facility] (Pharmacist) on 05/30/22 at 0815  Med List Status: <None>   Medication Order Taking? Sig Documenting Provider Last Dose Status Informant  Alpha-Lipoic Acid 600 MG CAPS [id number] No Take 1 capsule (600 mg total) by mouth daily. For diabetic neuropathy [name], DO Taking Active            Med Note Abbe Amsterdam, AMY E   Mon Sep 09, 2021  1:38 PM) Taking every other day  aspirin EC 81 MG tablet [id number] No Take 81 mg by mouth in the morning and at bedtime. Swallow whole. [provider] Taking Active   atorvastatin (LIPITOR) 40 MG tablet [id number] No TAKE 1 TABLET BY MOUTH EVERYDAY AT BEDTIME Delynn Flavin M, DO Taking Active   Blood Glucose Monitoring Suppl (ONE TOUCH ULTRA 2) w/Device KIT [id number] No UAD to check BGs E11.9 Delynn Flavin M, DO Taking Active   glipiZIDE (GLUCOTROL XL) 5 MG 24 hr tablet [id number] No Take 1 tablet (5 mg total) by mouth daily with breakfast. For fasting sugar >200. Delynn Flavin M, DO Taking Active   lisinopril (ZESTRIL) 10 MG tablet [id number] No TAKE 1 TABLET (10 MG TOTAL) BY MOUTH IN THE MORNING AND AT BEDTIME. (STOP TAKING LISINOPRIL 20MG ) Delynn Flavin M, DO Taking Active   metFORMIN (GLUCOPHAGE-XR) 500 MG 24 hr tablet [id number] No TAKE 2 TABLETS BY MOUTH EVERY DAY WITH BREAKFAST Gottschalk, Ashly M, DO Taking Active   metoprolol tartrate (LOPRESSOR) 25 MG  tablet [id number] No TAKE 1 TABLET BY MOUTH TWICE A DAY [name], DO Taking Active   OneTouch Delica Lancets 30G MISC [id number] No Check BS BID Dx E11.40 [name], DO Taking Active   [facility] ULTRA test strip [id number] No CHECK BLOOD SUGAR TWICE DAILY DX 11.40 Delynn Flavin M, DO Taking Active   pramipexole (MIRAPEX) 0.25 MG tablet [id number] No TAKE 1 TABLET 2 HOURS BEFORE BEDTIME FOR RESTLESS LEGS Delynn Flavin M, DO  Taking Active   Semaglutide,0.25 or 0.5MG /DOS, (OZEMPIC, 0.25 OR 0.5 MG/DOSE,) 2 MG/3ML SOPN [id number] No Inject 0.5 mg into the skin every 7 (seven) days.  Patient not taking: Reported on 05/21/2022   [name], DO Not Taking Active   TURMERIC PO [id number] No Take 1 capsule by mouth 2 (two) times daily.  [provider] Taking Active Self  zolpidem (AMBIEN) 5 MG tablet [id number] No take 1 tablet(5 mg total) by mouth at bedtime as needed for sleep. [name], DO Taking Active              Assessment/Plan:   Diabetes: - Currently uncontrolled- 7.7% - Reviewed long term cardiovascular and renal outcomes of uncontrolled blood sugar - Reviewed goal A1c, goal fasting, and goal 2 hour post prandial glucose - Reviewed dietary modifications including healthy plate method - Reviewed lifestyle modifications including: increased exercise, water - Recommend to restart Ozempic at 0.25mg  sq weekly for 2 weeks, then increase to 0.5mg  weekly as she awaits her patient assistance supply   Denies personal and family history of Medullary thyroid cancer (MTC) -Continue all other medications as prescribed. May be able to d/c glipizide at f/u A1c with PCP. - Recommend to check glucose daily (fasting) or if symptomatic -Reivewed fill history; patient appears/reports compliance; LDL at goal - Meets financial criteria for Ozempic patient assistance program through [employer]. Since we did not start initial application, we will not follow.  Patient was given the information to f/u and call. [phone].    Follow Up Plan: 4 weeks w/ PharmD; PCP   [name], PharmD, BCACP Clinical Pharmacist, [facility]

## 2022-06-05 ENCOUNTER — Other Ambulatory Visit: Payer: Self-pay | Admitting: Family Medicine

## 2022-06-05 DIAGNOSIS — E1169 Type 2 diabetes mellitus with other specified complication: Secondary | ICD-10-CM

## 2022-06-05 NOTE — Telephone Encounter (Signed)
Received notification from [employer] regarding approval for OZEMPIC. Patient assistance approved from 06/02/22 to 01/27/23.  Medication will ship to office  Phone: [phone]

## 2022-06-12 ENCOUNTER — Telehealth: Payer: Self-pay | Admitting: Pharmacist

## 2022-06-12 NOTE — Telephone Encounter (Signed)
    06/12/2022 Name: Candace Lopez MRN: [id number] DOB: 01/23/1951   Diabetes: - Currently uncontrolled- 7.7% - Reviewed long term cardiovascular and renal outcomes of uncontrolled blood sugar - Reviewed goal A1c, goal fasting, and goal 2 hour post prandial glucose - Reviewed dietary modifications including healthy plate method - Reviewed lifestyle modifications including: increased exercise, water - Continue on Ozempic 0.5mg  weekly as she awaits her patient assistance supply              Denies personal and family history of Medullary thyroid cancer (MTC) -Continue all other medications as prescribed. May be able to d/c glipizide at f/u A1c with PCP. - Recommend to check glucose daily (fasting) or if symptomatic -Reivewed fill history; patient appears/reports compliance; LDL at goal - Meets financial criteria for Ozempic patient assistance program through [employer]. Patient has been approved an #4 boxes of Ozempic arrived today.  Placed supply in lab fridge for pick up. - Will f/u with PharmD in 1-2 months for titration; d/c glipizide    [name], PharmD, BCACP Clinical Pharmacist, [facility]

## 2022-06-16 ENCOUNTER — Telehealth: Payer: Self-pay | Admitting: *Deleted

## 2022-06-16 ENCOUNTER — Telehealth: Payer: Medicare HMO

## 2022-06-16 NOTE — Telephone Encounter (Signed)
   CCM RN Visit Note   06/16/22 Name: Candace Lopez MRN: [id number]      DOB: 25-Feb-1950  Subjective: Candace Lopez is a 72 y.o. year old female who is a primary care patient of Delynn Flavin DO. The patient was referred to the Chronic Care Management team for assistance with care management needs subsequent to provider initiation of CCM services and plan of care.      An unsuccessful telephone outreach was attempted today to contact the patient about Chronic Care Management needs.    Plan:Telephone follow up appointment with care management team member scheduled for:  upon care guide rescheduling.  Irving Shows RNC, BSN RN Case Manager [facility] [phone]

## 2022-06-17 ENCOUNTER — Ambulatory Visit (INDEPENDENT_AMBULATORY_CARE_PROVIDER_SITE_OTHER): Payer: Medicare HMO | Admitting: *Deleted

## 2022-06-17 DIAGNOSIS — E1140 Type 2 diabetes mellitus with diabetic neuropathy, unspecified: Secondary | ICD-10-CM

## 2022-06-17 DIAGNOSIS — I152 Hypertension secondary to endocrine disorders: Secondary | ICD-10-CM

## 2022-06-17 NOTE — Patient Instructions (Signed)
Please call the care guide team at [phone] if you need to cancel or reschedule your appointment.   If you are experiencing a Mental Health or Behavioral Health Crisis or need someone to talk to, please go to [facility] [address] [place] [phone]) call the [facility]: [phone] call [phone]   Following is a copy of the CCM Program Consent:  CCM service includes personalized support from designated clinical staff supervised by the physician, including individualized plan of care and coordination with other care providers 24/7 contact phone numbers for assistance for urgent and routine care needs. Service will only be billed when office clinical staff spend 20 minutes or more in a month to coordinate care. Only one practitioner may furnish and bill the service in a calendar month. The patient may stop CCM services at amy time (effective at the end of the month) by phone call to the office staff. The patient will be responsible for cost sharing (co-pay) or up to 20% of the service fee (after annual deductible is met)  Following is a copy of your full provider care plan:   Goals Addressed             This Visit's Progress    CCM (DIABETES) EXPECTED OUTCOME: MONITOR, SELF- MANAGE AND REDUCE SYMPTOMS OF DIABETES       Current Barriers:  Knowledge Deficits related to Diabetes management Care Coordination needs related to pharmacy needs, medication assistance in a patient with Diabetes Chronic Disease Management support and education needs related to Diabetes No Advanced Directives in place- documents previously mailed Patient reports she checks CBG once daily fasting with readings 138-180, states this is an improvement because readings were near 200 most of the time Patient reports she does not drink sugary drinks, usually drinks a lot of water Pharmacist continues working with pt for medication assistance Patient  reports she has all medications and taking as prescribed  Planned Interventions: Reviewed medications with patient and discussed importance of medication adherence;        Counseled on importance of regular laboratory monitoring as prescribed;        Advised patient, providing education and rationale, to check cbg once daily and record        call provider for findings outside established parameters;       Review of patient status, including review of consultants reports, relevant laboratory and other test results, and medications completed;       Reinforced carbohydrate modified diet  Symptom Management: Take medications as prescribed   Attend all scheduled provider appointments Call pharmacy for medication refills 3-7 days in advance of running out of medications Attend church or other social activities Perform all self care activities independently  Perform IADL's (shopping, preparing meals, housekeeping, managing finances) independently Call provider office for new concerns or questions  check blood sugar at prescribed times: once daily check feet daily for cuts, sores or redness enter blood sugar readings and medication or insulin into daily log take the blood sugar log to all doctor visits take the blood sugar meter to all doctor visits trim toenails straight across fill half of plate with vegetables limit fast food meals to no more than 1 per week manage portion size prepare main meal at home 3 to 5 days each week read food labels for fat, fiber, carbohydrates and portion size set a realistic goal wash and dry feet carefully every day wear comfortable, cotton socks Advanced directives mailed- please  look over and complete  Follow Up Plan: Telephone follow up appointment with care management team member scheduled for:  09/15/22 at 9 am       COMPLETED: CCM (HYPERLIPIDEMIA) EXPECTED OUTCOME: MONITOR, SELF-MANAGE AND REDUCE SYMPTOMS OF HYPERLIPIDEMIA       Current Barriers:   Knowledge Deficits related to Hyperlipidemia management Chronic Disease Management support and education needs related to Hyperlipidemia, diet  Planned Interventions: Provider established cholesterol goals reviewed; Counseled on importance of regular laboratory monitoring as prescribed; Provided HLD educational materials; Reviewed importance of limiting foods high in cholesterol; Reviewed exercise goals and target of 150 minutes per week;  Symptom Management: Take medications as prescribed   Attend all scheduled provider appointments Call pharmacy for medication refills 3-7 days in advance of running out of medications Attend church or other social activities Perform all self care activities independently  Perform IADL's (shopping, preparing meals, housekeeping, managing finances) independently Call provider office for new concerns or questions  - call doctor with any symptoms you believe are related to your medicine - call doctor when you experience any new symptoms - go to all doctor appointments as scheduled - adhere to prescribed diet: heart healthy Continue working out at the gym Follow heart healthy diet Bake and broil foods instead of frying  Follow Up Plan: Telephone follow up appointment with care management team member scheduled for:   09/15/22 at 9 am       COMPLETED: CCM (HYPERTENSION) EXPECTED OUTCOME: MONITOR, SELF-MANAGE AND REDUCE SYMPTOMS OF HYPERTENSION       Current Barriers:  Knowledge Deficits related to Hypertension management Chronic Disease Management support and education needs related to Hypertension, diet No Advanced Directives in place- pt requests information be mailed Patient reports she lives with spouse and assists with his care due to he is in poor health, patient states she can call on her daughter at any time for assistance if needed, reports independent with all aspects of her care. Patient reports she has blood pressure cuff but does not  monitor blood pressure Patient reports she is getting back into exercising and trying to be more consistent, goes to gym to workout Patient reports 2 falls in the past year and sprained her ankle with one of the falls  Planned Interventions: Evaluation of current treatment plan related to hypertension self management and patient's adherence to plan as established by provider;   Reviewed medications with patient and discussed importance of compliance;  Counseled on the importance of exercise goals with target of 150 minutes per week Discussed plans with patient for ongoing care management follow up and provided patient with direct contact information for care management team; Advised patient, providing education and rationale, to monitor blood pressure daily and record, calling PCP for findings outside established parameters;  Discussed complications of poorly controlled blood pressure such as heart disease, stroke, circulatory complications, vision complications, kidney impairment, sexual dysfunction;  Reviewed upcoming scheduled appointments  Reinforced safety precautions Reviewed low sodium diet   Symptom Management: Take medications as prescribed   Attend all scheduled provider appointments Call pharmacy for medication refills 3-7 days in advance of running out of medications Attend church or other social activities Perform all self care activities independently  Perform IADL's (shopping, preparing meals, housekeeping, managing finances) independently Call provider office for new concerns or questions  check blood pressure weekly choose a place to take my blood pressure (home, clinic or office, retail store) write blood pressure results in a log or diary learn about high  blood pressure keep a blood pressure log take blood pressure log to all doctor appointments keep all doctor appointments take medications for blood pressure exactly as prescribed report new symptoms to your  doctor eat more whole grains, fruits and vegetables, lean meats and healthy fats Follow low sodium diet Read food labels for sodium content Limit fast food fall prevention strategies: change position slowly, use assistive device such as walker or cane (per provider recommendations) when walking, keep walkways clear, have good lighting in room. It is important to contact your provider if you have any falls, maintain muscle strength/tone by exercise per provider recommendations.  Follow Up Plan: Telephone follow up appointment with care management team member scheduled for:  09/15/22 at 9 am          The patient verbalized understanding of instructions, educational materials, and care plan provided today and DECLINED offer to receive copy of patient instructions, educational materials, and care plan.  Telephone follow up appointment with care management team member scheduled for:  09/15/22 at 9 am

## 2022-06-17 NOTE — Chronic Care Management (AMB) (Signed)
Chronic Care Management   CCM RN Visit Note  06/17/2022 Name: Candace Lopez MRN: [id number] DOB: Jan 08, 1951  Subjective: Candace Lopez is a 72 y.o. year old female who is a primary care patient of [name], DO. The patient was referred to the Chronic Care Management team for assistance with care management needs subsequent to provider initiation of CCM services and plan of care.    Today's Visit:  Engaged with patient by telephone for follow up visit.        Goals Addressed             This Visit's Progress    CCM (DIABETES) EXPECTED OUTCOME: MONITOR, SELF- MANAGE AND REDUCE SYMPTOMS OF DIABETES       Current Barriers:  Knowledge Deficits related to Diabetes management Care Coordination needs related to pharmacy needs, medication assistance in a patient with Diabetes Chronic Disease Management support and education needs related to Diabetes No Advanced Directives in place- documents previously mailed Patient reports she checks CBG once daily fasting with readings 138-180, states this is an improvement because readings were near 200 most of the time Patient reports she does not drink sugary drinks, usually drinks a lot of water Pharmacist continues working with pt for medication assistance Patient reports she has all medications and taking as prescribed  Planned Interventions: Reviewed medications with patient and discussed importance of medication adherence;        Counseled on importance of regular laboratory monitoring as prescribed;        Advised patient, providing education and rationale, to check cbg once daily and record        call provider for findings outside established parameters;       Review of patient status, including review of consultants reports, relevant laboratory and other test results, and medications completed;       Reinforced carbohydrate modified diet  Symptom Management: Take medications as prescribed   Attend all scheduled provider  appointments Call pharmacy for medication refills 3-7 days in advance of running out of medications Attend church or other social activities Perform all self care activities independently  Perform IADL's (shopping, preparing meals, housekeeping, managing finances) independently Call provider office for new concerns or questions  check blood sugar at prescribed times: once daily check feet daily for cuts, sores or redness enter blood sugar readings and medication or insulin into daily log take the blood sugar log to all doctor visits take the blood sugar meter to all doctor visits trim toenails straight across fill half of plate with vegetables limit fast food meals to no more than 1 per week manage portion size prepare main meal at home 3 to 5 days each week read food labels for fat, fiber, carbohydrates and portion size set a realistic goal wash and dry feet carefully every day wear comfortable, cotton socks Advanced directives mailed- please look over and complete  Follow Up Plan: Telephone follow up appointment with care management team member scheduled for:  09/15/22 at 9 am       COMPLETED: CCM (HYPERLIPIDEMIA) EXPECTED OUTCOME: MONITOR, SELF-MANAGE AND REDUCE SYMPTOMS OF HYPERLIPIDEMIA       Current Barriers:  Knowledge Deficits related to Hyperlipidemia management Chronic Disease Management support and education needs related to Hyperlipidemia, diet  Planned Interventions: Provider established cholesterol goals reviewed; Counseled on importance of regular laboratory monitoring as prescribed; Provided HLD educational materials; Reviewed importance of limiting foods high in cholesterol; Reviewed exercise goals and target of 150 minutes per week;  Symptom Management: Take medications as prescribed   Attend all scheduled provider appointments Call pharmacy for medication refills 3-7 days in advance of running out of medications Attend church or other social  activities Perform all self care activities independently  Perform IADL's (shopping, preparing meals, housekeeping, managing finances) independently Call provider office for new concerns or questions  - call doctor with any symptoms you believe are related to your medicine - call doctor when you experience any new symptoms - go to all doctor appointments as scheduled - adhere to prescribed diet: heart healthy Continue working out at the gym Follow heart healthy diet Bake and broil foods instead of frying  Follow Up Plan: Telephone follow up appointment with care management team member scheduled for:   09/15/22 at 9 am       COMPLETED: CCM (HYPERTENSION) EXPECTED OUTCOME: MONITOR, SELF-MANAGE AND REDUCE SYMPTOMS OF HYPERTENSION       Current Barriers:  Knowledge Deficits related to Hypertension management Chronic Disease Management support and education needs related to Hypertension, diet No Advanced Directives in place- pt requests information be mailed Patient reports she lives with spouse and assists with his care due to he is in poor health, patient states she can call on her daughter at any time for assistance if needed, reports independent with all aspects of her care. Patient reports she has blood pressure cuff but does not monitor blood pressure Patient reports she is getting back into exercising and trying to be more consistent, goes to gym to workout Patient reports 2 falls in the past year and sprained her ankle with one of the falls  Planned Interventions: Evaluation of current treatment plan related to hypertension self management and patient's adherence to plan as established by provider;   Reviewed medications with patient and discussed importance of compliance;  Counseled on the importance of exercise goals with target of 150 minutes per week Discussed plans with patient for ongoing care management follow up and provided patient with direct contact information for care  management team; Advised patient, providing education and rationale, to monitor blood pressure daily and record, calling PCP for findings outside established parameters;  Discussed complications of poorly controlled blood pressure such as heart disease, stroke, circulatory complications, vision complications, kidney impairment, sexual dysfunction;  Reviewed upcoming scheduled appointments  Reinforced safety precautions Reviewed low sodium diet   Symptom Management: Take medications as prescribed   Attend all scheduled provider appointments Call pharmacy for medication refills 3-7 days in advance of running out of medications Attend church or other social activities Perform all self care activities independently  Perform IADL's (shopping, preparing meals, housekeeping, managing finances) independently Call provider office for new concerns or questions  check blood pressure weekly choose a place to take my blood pressure (home, clinic or office, retail store) write blood pressure results in a log or diary learn about high blood pressure keep a blood pressure log take blood pressure log to all doctor appointments keep all doctor appointments take medications for blood pressure exactly as prescribed report new symptoms to your doctor eat more whole grains, fruits and vegetables, lean meats and healthy fats Follow low sodium diet Read food labels for sodium content Limit fast food fall prevention strategies: change position slowly, use assistive device such as walker or cane (per provider recommendations) when walking, keep walkways clear, have good lighting in room. It is important to contact your provider if you have any falls, maintain muscle strength/tone by exercise per provider recommendations.  Follow Up  Plan: Telephone follow up appointment with care management team member scheduled for:  09/15/22 at 9 am          Plan:Telephone follow up appointment with care management team  member scheduled for:  09/15/22 at 9 am  Irving Shows [facility], BSN RN Case Manager [facility] [phone]

## 2022-06-27 ENCOUNTER — Other Ambulatory Visit: Payer: Self-pay | Admitting: Family Medicine

## 2022-06-27 DIAGNOSIS — E1159 Type 2 diabetes mellitus with other circulatory complications: Secondary | ICD-10-CM

## 2022-06-27 DIAGNOSIS — I10 Essential (primary) hypertension: Secondary | ICD-10-CM | POA: Diagnosis not present

## 2022-06-27 DIAGNOSIS — Z7984 Long term (current) use of oral hypoglycemic drugs: Secondary | ICD-10-CM | POA: Diagnosis not present

## 2022-07-01 ENCOUNTER — Ambulatory Visit (INDEPENDENT_AMBULATORY_CARE_PROVIDER_SITE_OTHER): Payer: Medicare HMO | Admitting: Pharmacist

## 2022-07-01 DIAGNOSIS — E1140 Type 2 diabetes mellitus with diabetic neuropathy, unspecified: Secondary | ICD-10-CM

## 2022-07-01 NOTE — Progress Notes (Signed)
07/01/2022 Name: Candace Lopez MRN: [id number] DOB: Feb 03, 1950  Chief Complaint  Patient presents with   Diabetes   Subjective:  Care Team: Primary Care Provider: [name], DO ; Next Scheduled Visit: 07/07/2022   Medication Access/Adherence  Current Pharmacy:  [facility] [phone] - [place] - [address] [place] [address] [place] [address] Phone: [phone] Fax: [phone]  [facility] - [place], [place] - [address] [place] [address] Phone: [phone] Fax: [phone]   Patient reports affordability concerns with their medications: Yes  Patient reports access/transportation concerns to their pharmacy: No  Patient reports adherence concerns with their medications:  No     Diabetes:  Current medications: Ozempic 0.5 mg weekly, metformin, glipizide Medications tried in the past: Onglyza, Tradjenta  Current glucose readings: 100-130s, a few readings ~170 when she had had a carb heavy meal the night before Using one touch ultra meter; testing 1 time daily  Patient denies hypoglycemic s/sx including dizziness, shakiness, sweating. Patient denies hyperglycemic symptoms including polyuria, polydipsia, polyphagia, nocturia, neuropathy, blurred vision.  Current physical activity: encouraged  Current medication access support: enrolled in novo nordisk PAP until 01/26/2023 (med ships to office)   Objective:  Lab Results  Component Value Date   HGBA1C 7.7 (H) 04/04/2022    Lab Results  Component Value Date   CREATININE 0.91 12/30/2021   BUN 13 12/30/2021   NA 134 12/30/2021   K 5.1 12/30/2021   CL 100 12/30/2021   CO2 20 12/30/2021    Lab Results  Component Value Date   CHOL 131 04/04/2022   HDL 30 (L) 04/04/2022   LDLCALC 67 04/04/2022   LDLDIRECT 67 12/13/2019   TRIG 202 (H) 04/04/2022   CHOLHDL 4.4 04/04/2022    Medications Reviewed Today     Reviewed by Rushie Goltz, [facility] (Pharmacist)  on 07/01/22 at [phone]  Med List Status: <None>   Medication Order Taking? Sig Documenting Provider Last Dose Status Informant  Alpha-Lipoic Acid 600 MG CAPS [id number]  Take 1 capsule (600 mg total) by mouth daily. For diabetic neuropathy [name], DO  Active            Med Note Abbe Amsterdam, AMY E   Mon Sep 09, 2021  1:38 PM) Taking every other day  aspirin EC 81 MG tablet [id number]  Take 81 mg by mouth in the morning and at bedtime. Swallow whole. [provider]  Active   atorvastatin (LIPITOR) 40 MG tablet [id number] Yes TAKE 1 TABLET BY MOUTH EVERYDAY AT BEDTIME Delynn Flavin M, DO Taking Active   Blood Glucose Monitoring Suppl (ONE TOUCH ULTRA 2) w/Device KIT [id number]  UAD to check BGs E11.9 Delynn Flavin M, DO  Active   glipiZIDE (GLUCOTROL XL) 5 MG 24 hr tablet [id number] Yes Take 1 tablet (5 mg total) by mouth daily with breakfast. For fasting sugar >200. Delynn Flavin M, DO Taking Active   lisinopril (ZESTRIL) 10 MG tablet [id number] Yes TAKE 1 TABLET (10 MG TOTAL) BY MOUTH IN THE MORNING AND AT BEDTIME. (STOP TAKING LISINOPRIL 20MG ) Delynn Flavin M, DO Taking Active   metFORMIN (GLUCOPHAGE-XR) 500 MG 24 hr tablet [id number] Yes TAKE 2 TABLETS BY MOUTH EVERY DAY WITH BREAKFAST Delynn Flavin M, DO Taking Active   metoprolol tartrate (LOPRESSOR) 25 MG tablet [id number]  TAKE 1 TABLET BY MOUTH TWICE A DAY Gottschalk, Ashly M, DO  Active   OneTouch Delica Lancets 30G MISC  [id number]  Check BS BID Dx E11.40 [name], DO  Active   [facility] ULTRA test strip [id number]  CHECK BLOOD SUGAR TWICE DAILY DX 11.40 Delynn Flavin M, [place]  Active   pramipexole (MIRAPEX) 0.25 MG tablet [id number]  TAKE 1 TABLET 2 HOURS BEFORE BEDTIME FOR RESTLESS LEGS Gottschalk, Ashly M, DO  Active   Semaglutide,0.25 or 0.5MG /DOS, (OZEMPIC, 0.25 OR 0.5 MG/DOSE,) 2 MG/3ML SOPN [id number] Yes Inject 0.5 mg into the skin every 7 (seven) days. [name], DO Taking Active             Med Note Cresenciano Genre, Lilla Shook   Fri May 30, 2022  9:04 AM) Via novo nordisk patient assistance program    TURMERIC PO [id number]  Take 1 capsule by mouth 2 (two) times daily.  [provider]  Active Self  zolpidem (AMBIEN) 5 MG tablet [id number]  take 1 tablet(5 mg total) by mouth at bedtime as needed for sleep. Delynn Flavin M, DO  Active              Assessment/Plan:   Diabetes: - Currently uncontrolled- 7.7% - Reviewed long term cardiovascular and renal outcomes of uncontrolled blood sugar - Reviewed goal A1c, goal fasting, and goal 2 hour post prandial glucose - Reviewed dietary modifications including healthy plate method - Reviewed lifestyle modifications including: increased exercise, water - Recommend to continue Ozempic at 0.5 mg sq weekly. Patient has tolerated 2-4 doses with minimal GI upset (some nausea).   Denies personal and family history of Medullary thyroid cancer (MTC) -Continue all other medications as prescribed. May be able to d/c glipizide at f/u A1c with PCP. - Recommend to check glucose daily (fasting) or if symptomatic -Reivewed fill history; patient appears/reports compliance; LDL at goal - Meets financial criteria for Ozempic patient assistance program through novo nordisk.   Follow Up Plan:  4 weeks w/ PharmD PCP: 07/07/2022  Valeda Malm, Pharm.D. PGY-2 Ambulatory Care Pharmacy Resident  [name], PharmD, BCACP Clinical Pharmacist, [facility]

## 2022-07-04 ENCOUNTER — Other Ambulatory Visit: Payer: Self-pay | Admitting: Family Medicine

## 2022-07-04 DIAGNOSIS — F5101 Primary insomnia: Secondary | ICD-10-CM

## 2022-07-07 ENCOUNTER — Ambulatory Visit (INDEPENDENT_AMBULATORY_CARE_PROVIDER_SITE_OTHER): Payer: Medicare HMO | Admitting: Family Medicine

## 2022-07-07 ENCOUNTER — Ambulatory Visit (INDEPENDENT_AMBULATORY_CARE_PROVIDER_SITE_OTHER): Payer: Medicare HMO

## 2022-07-07 ENCOUNTER — Encounter: Payer: Self-pay | Admitting: Family Medicine

## 2022-07-07 VITALS — BP 116/71 | HR 74 | Temp 98.3°F | Ht 65.0 in | Wt 157.0 lb

## 2022-07-07 DIAGNOSIS — E1169 Type 2 diabetes mellitus with other specified complication: Secondary | ICD-10-CM | POA: Diagnosis not present

## 2022-07-07 DIAGNOSIS — E785 Hyperlipidemia, unspecified: Secondary | ICD-10-CM

## 2022-07-07 DIAGNOSIS — E1140 Type 2 diabetes mellitus with diabetic neuropathy, unspecified: Secondary | ICD-10-CM

## 2022-07-07 DIAGNOSIS — E1159 Type 2 diabetes mellitus with other circulatory complications: Secondary | ICD-10-CM

## 2022-07-07 DIAGNOSIS — Z780 Asymptomatic menopausal state: Secondary | ICD-10-CM

## 2022-07-07 DIAGNOSIS — Z7985 Long-term (current) use of injectable non-insulin antidiabetic drugs: Secondary | ICD-10-CM

## 2022-07-07 DIAGNOSIS — I152 Hypertension secondary to endocrine disorders: Secondary | ICD-10-CM

## 2022-07-07 DIAGNOSIS — F5101 Primary insomnia: Secondary | ICD-10-CM | POA: Diagnosis not present

## 2022-07-07 DIAGNOSIS — R112 Nausea with vomiting, unspecified: Secondary | ICD-10-CM | POA: Diagnosis not present

## 2022-07-07 LAB — BASIC METABOLIC PANEL
BUN/Creatinine Ratio: 17 (ref 12–28)
BUN: 15 mg/dL (ref 8–27)
CO2: 21 mmol/L (ref 20–29)
Calcium: 10.1 mg/dL (ref 8.7–10.3)
Chloride: 105 mmol/L (ref 96–106)
Creatinine, Ser: 0.9 mg/dL (ref 0.57–1.00)
Glucose: 103 mg/dL — ABNORMAL HIGH (ref 70–99)
Potassium: 4.8 mmol/L (ref 3.5–5.2)
Sodium: 138 mmol/L (ref 134–144)
eGFR: 69 mL/min/{1.73_m2} (ref >59)

## 2022-07-07 LAB — BAYER DCA HB A1C WAIVED: HB A1C (BAYER DCA - WAIVED): 6.4 % — ABNORMAL HIGH (ref 4.8–5.6)

## 2022-07-07 MED ORDER — ZOLPIDEM TARTRATE 5 MG PO TABS
ORAL_TABLET | ORAL | 5 refills | Status: DC
Start: 2022-07-07 — End: 2023-01-06

## 2022-07-07 MED ORDER — FAMOTIDINE 20 MG PO TABS
20.0000 mg | ORAL_TABLET | Freq: Two times a day (BID) | ORAL | 3 refills | Status: DC | PRN
Start: 2022-07-07 — End: 2022-11-25

## 2022-07-07 NOTE — Progress Notes (Signed)
Subjective: CC:DM PCP: [name], DO RUE:AVWUJW Candace Lopez is a 72 y.o. female presenting to clinic today for:  1. Type 2 Diabetes with hypertension, hyperlipidemia:  Compliant with Ozempic.  She admits that she does tend to get some nausea and light vomiting after certain types of food including spaghetti or [place] food.  She denies any hypoglycemic episodes.  She is trying to hydrate well.  No reports of changes in bowel habits.  Last eye exam: needs. Sees [facility] and will schedule Last foot exam: UTD Last A1c:  Lab Results  Component Value Date   HGBA1C 7.7 (Candace) 04/04/2022   Nephropathy screen indicated?: UTD Last flu, zoster and/or pneumovax:  Immunization History  Administered Date(s) Administered   Fluad Quad(high Dose 65+) 10/07/2018, 10/31/2019, 11/05/2020, 12/03/2021   Influenza, High Dose Seasonal PF 12/29/2016, 10/21/2017   Moderna Covid-19 Vaccine Bivalent Booster 89yrs & up 03/07/2021   Moderna Sars-Covid-2 Vaccination 04/26/2019, 05/24/2019, 01/24/2020   Pneumococcal Conjugate-13 08/26/2017   Pneumococcal Polysaccharide-23 12/13/2019   Tdap 07/16/2012    2.  Insomnia Patient reports that she continues to use Ambien regularly.  No excessive daytime sedation, falls or respiratory depression.  ROS: Per HPI  Allergies  Allergen Reactions   Tape Rash   Past Medical History:  Diagnosis Date   DDD (degenerative disc disease), lumbar    Diabetes mellitus without complication (HCC)    GERD (gastroesophageal reflux disease)    Hyperlipidemia    Hypertension    Urge incontinence of urine     Current Outpatient Medications:    Alpha-Lipoic Acid 600 MG CAPS, Take 1 capsule (600 mg total) by mouth daily. For diabetic neuropathy, Disp: 90 capsule, Rfl: 3   aspirin EC 81 MG tablet, Take 81 mg by mouth in the morning and at bedtime. Swallow whole., Disp: , Rfl:    atorvastatin (LIPITOR) 40 MG tablet, TAKE 1 TABLET BY MOUTH EVERYDAY AT BEDTIME, Disp: 90  tablet, Rfl: 0   Blood Glucose Monitoring Suppl (ONE TOUCH ULTRA 2) w/Device KIT, UAD to check BGs E11.9, Disp: 1 kit, Rfl: 0   glipiZIDE (GLUCOTROL XL) 5 MG 24 hr tablet, Take 1 tablet (5 mg total) by mouth daily with breakfast. For fasting sugar >200., Disp: 90 tablet, Rfl: 3   lisinopril (ZESTRIL) 10 MG tablet, TAKE 1 TABLET (10 MG TOTAL) BY MOUTH IN THE MORNING AND AT BEDTIME. (STOP TAKING LISINOPRIL 20MG ), Disp: 180 tablet, Rfl: 0   metFORMIN (GLUCOPHAGE-XR) 500 MG 24 hr tablet, TAKE 2 TABLETS BY MOUTH EVERY DAY WITH BREAKFAST, Disp: 180 tablet, Rfl: 0   metoprolol tartrate (LOPRESSOR) 25 MG tablet, TAKE 1 TABLET BY MOUTH TWICE A DAY, Disp: 180 tablet, Rfl: 0   OneTouch Delica Lancets 30G MISC, Check BS BID Dx E11.40, Disp: 200 each, Rfl: 3   ONETOUCH ULTRA test strip, CHECK BLOOD SUGAR TWICE DAILY DX 11.40, Disp: 200 strip, Rfl: 4   pramipexole (MIRAPEX) 0.25 MG tablet, TAKE 1 TABLET 2 HOURS BEFORE BEDTIME FOR RESTLESS LEGS, Disp: 90 tablet, Rfl: 0   Semaglutide,0.25 or 0.5MG /DOS, (OZEMPIC, 0.25 OR 0.5 MG/DOSE,) 2 MG/3ML SOPN, Inject 0.5 mg into the skin every 7 (seven) days., Disp: 9 mL, Rfl: 3   TURMERIC PO, Take 1 capsule by mouth 2 (two) times daily. , Disp: , Rfl:    zolpidem (AMBIEN) 5 MG tablet, take 1 tablet(5 mg total) by mouth at bedtime as needed for sleep., Disp: 30 tablet, Rfl: 5 Social History   Socioeconomic History   Marital  status: Married    Spouse name: Candace Lopez   Number of children: 2   Years of education: Not on file   Highest education level: Not on file  Occupational History   Occupation: retired  Tobacco Use   Smoking status: Never   Smokeless tobacco: Never  Vaping Use   Vaping Use: Never used  Substance and Sexual Activity   Alcohol use: No   Drug use: No   Sexual activity: Not on file  Other Topics Concern   Not on file  Social History Narrative   2 children  - son lives in [place], daughter 15 minutes away.   09/09/21 - She is having to stay home  more to take care of husband who is not in good health   Social Determinants of Health   Financial Resource Strain: Low Risk  (05/21/2022)   Overall Financial Resource Strain (CARDIA)    Difficulty of Paying Living Expenses: Not hard at all  Food Insecurity: No Food Insecurity (05/21/2022)   Hunger Vital Sign    Worried About Running Out of Food in the Last Year: Never true    Ran Out of Food in the Last Year: Never true  Transportation Needs: No Transportation Needs (05/21/2022)   PRAPARE - Administrator, Civil Service (Medical): No    Lack of Transportation (Non-Medical): No  Physical Activity: Insufficiently Active (05/21/2022)   Exercise Vital Sign    Days of Exercise per Week: 3 days    Minutes of Exercise per Session: 30 min  Stress: No Stress Concern Present (05/21/2022)   [employer] of Occupational Health - Occupational Stress Questionnaire    Feeling of Stress : Not at all  Social Connections: Moderately Isolated (05/21/2022)   Social Connection and Isolation Panel [NHANES]    Frequency of Communication with Friends and Family: More than three times a week    Frequency of Social Gatherings with Friends and Family: More than three times a week    Attends Religious Services: Never    Database administrator or Organizations: No    Attends Banker Meetings: Never    Marital Status: Married  Catering manager Violence: Not At Risk (05/21/2022)   Humiliation, Afraid, Rape, and Kick questionnaire    Fear of Current or Ex-Partner: No    Emotionally Abused: No    Physically Abused: No    Sexually Abused: No   Family History  Problem Relation Age of Onset   Cancer Mother        lung cancer   Cancer Father        lymphnodes   Diabetes Sister    Lung cancer Sister    Diabetes Brother    Cancer Maternal Grandfather    Heart disease Paternal Grandfather    Diabetes Son    Cancer Maternal Aunt        throat   Cancer Paternal Uncle        unknown     Cancer Other        breast   Colon cancer Neg Hx     Objective: Office vital signs reviewed. BP 116/71   Pulse 74   Temp 98.3 F (36.8 C)   Ht 5\' 5"  (1.651 m)   Wt 157 lb (71.2 kg)   SpO2 97%   BMI 26.13 kg/m   Physical Examination:  General: Awake, alert, well nourished, No acute distress HEENT: sclera white, MMM Cardio: regular rate and rhythm, S1S2 heard, no murmurs  appreciated Pulm: clear to auscultation bilaterally, no wheezes, rhonchi or rales; normal work of breathing on room air GI: soft, non-tender, non-distended, bowel sounds present x4, no hepatomegaly, no splenomegaly, no masses    Assessment/ Plan: 72 y.o. female   Type 2 diabetes mellitus with diabetic neuropathy, without long-term current use of insulin (HCC) - Plan: Bayer DCA Hb A1c Waived, Basic Metabolic Panel  Hyperlipidemia associated with type 2 diabetes mellitus (HCC)  Hypertension associated with diabetes (HCC) - Plan: Basic Metabolic Panel  Primary insomnia - Plan: zolpidem (AMBIEN) 5 MG tablet  Nausea and vomiting, unspecified vomiting type - Plan: famotidine (PEPCID) 20 MG tablet  Sugar now at goal at 6.4.  Check renal function given use of GLP and reports of vomiting episodes.  I suspect that these episodes are likely secondary to increased acid production in the setting of the types of foods that she is eating and use of the GLP.  I am going to place her on Pepcid and we discussed utilizing this for the first couple of days following the injection.  She is also switched her injection to nighttime use.  Hopefully this will alleviate the symptoms she is doing well otherwise on this medication.  Continue blood pressure regimen and statin as directed  Up-to-date on UDS and CSC.  National narcotic database reviewed and there were no red flags.  Continue Ambien as needed sleep  May follow-up in 4 to 6 months, sooner if concerns arise.  No orders of the defined types were placed in this  encounter.  No orders of the defined types were placed in this encounter.    [name], DO [facility] [phone]

## 2022-07-11 DIAGNOSIS — M8589 Other specified disorders of bone density and structure, multiple sites: Secondary | ICD-10-CM | POA: Diagnosis not present

## 2022-07-11 DIAGNOSIS — Z780 Asymptomatic menopausal state: Secondary | ICD-10-CM | POA: Diagnosis not present

## 2022-07-16 ENCOUNTER — Other Ambulatory Visit: Payer: Self-pay

## 2022-07-16 DIAGNOSIS — M810 Age-related osteoporosis without current pathological fracture: Secondary | ICD-10-CM

## 2022-07-17 ENCOUNTER — Other Ambulatory Visit: Payer: Medicare HMO

## 2022-07-17 DIAGNOSIS — M810 Age-related osteoporosis without current pathological fracture: Secondary | ICD-10-CM | POA: Diagnosis not present

## 2022-07-18 ENCOUNTER — Other Ambulatory Visit: Payer: Self-pay | Admitting: Family Medicine

## 2022-07-18 DIAGNOSIS — M810 Age-related osteoporosis without current pathological fracture: Secondary | ICD-10-CM

## 2022-07-18 LAB — VITAMIN D 25 HYDROXY (VIT D DEFICIENCY, FRACTURES): Vit D, 25-Hydroxy: 39.8 ng/mL (ref 30.0–100.0)

## 2022-07-18 MED ORDER — ALENDRONATE SODIUM 70 MG PO TABS
70.0000 mg | ORAL_TABLET | ORAL | 3 refills | Status: DC
Start: 2022-07-18 — End: 2023-04-14

## 2022-08-06 ENCOUNTER — Other Ambulatory Visit: Payer: Self-pay | Admitting: Family Medicine

## 2022-08-06 DIAGNOSIS — E1159 Type 2 diabetes mellitus with other circulatory complications: Secondary | ICD-10-CM

## 2022-08-06 DIAGNOSIS — G2581 Restless legs syndrome: Secondary | ICD-10-CM

## 2022-08-20 ENCOUNTER — Other Ambulatory Visit: Payer: Medicare HMO

## 2022-09-02 ENCOUNTER — Other Ambulatory Visit (HOSPITAL_COMMUNITY): Payer: Self-pay | Admitting: Family Medicine

## 2022-09-02 DIAGNOSIS — Z1231 Encounter for screening mammogram for malignant neoplasm of breast: Secondary | ICD-10-CM

## 2022-09-03 DIAGNOSIS — H43813 Vitreous degeneration, bilateral: Secondary | ICD-10-CM | POA: Diagnosis not present

## 2022-09-03 DIAGNOSIS — H52223 Regular astigmatism, bilateral: Secondary | ICD-10-CM | POA: Diagnosis not present

## 2022-09-03 DIAGNOSIS — H2513 Age-related nuclear cataract, bilateral: Secondary | ICD-10-CM | POA: Diagnosis not present

## 2022-09-03 DIAGNOSIS — H5203 Hypermetropia, bilateral: Secondary | ICD-10-CM | POA: Diagnosis not present

## 2022-09-03 DIAGNOSIS — H524 Presbyopia: Secondary | ICD-10-CM | POA: Diagnosis not present

## 2022-09-03 DIAGNOSIS — E119 Type 2 diabetes mellitus without complications: Secondary | ICD-10-CM | POA: Diagnosis not present

## 2022-09-11 ENCOUNTER — Other Ambulatory Visit: Payer: Self-pay | Admitting: Family Medicine

## 2022-09-11 DIAGNOSIS — E1159 Type 2 diabetes mellitus with other circulatory complications: Secondary | ICD-10-CM

## 2022-09-11 DIAGNOSIS — E785 Hyperlipidemia, unspecified: Secondary | ICD-10-CM

## 2022-09-13 ENCOUNTER — Other Ambulatory Visit: Payer: Self-pay | Admitting: Family Medicine

## 2022-09-13 DIAGNOSIS — E1140 Type 2 diabetes mellitus with diabetic neuropathy, unspecified: Secondary | ICD-10-CM

## 2022-09-15 ENCOUNTER — Other Ambulatory Visit: Payer: Medicare HMO | Admitting: *Deleted

## 2022-09-15 ENCOUNTER — Encounter: Payer: Self-pay | Admitting: *Deleted

## 2022-09-15 NOTE — Patient Instructions (Signed)
Visit Information  Thank you for taking time to visit with me today. Please don't hesitate to contact me if I can be of assistance to you before our next scheduled telephone appointment.  Following are the goals we discussed today:   Goals Addressed             This Visit's Progress    CCM (DIABETES) EXPECTED OUTCOME: MONITOR, SELF- MANAGE AND REDUCE SYMPTOMS OF DIABETES       Current Barriers:  Knowledge Deficits related to Diabetes management Care Coordination needs related to pharmacy needs, medication assistance in a patient with Diabetes Chronic Disease Management support and education needs related to Diabetes No Advanced Directives in place- documents previously mailed Patient reports she checks CBG once daily fasting with readings 150-180, states this is an improvement because readings were near 200 most of the time, pt states she still wants to work towards lowering her blood sugar, recent AIC 6.4 on 07/07/22 Patient reports she does not drink sugary drinks, usually drinks a lot of water Patient reports she has all medications and taking as prescribed  Planned Interventions: Reviewed medications with patient and discussed importance of medication adherence;        Counseled on importance of regular laboratory monitoring as prescribed;        Advised patient, providing education and rationale, to check cbg once daily and record        call provider for findings outside established parameters;       Review of patient status, including review of consultants reports, relevant laboratory and other test results, and medications completed;       Reviewed carbohydrate modified diet/ plate method  Symptom Management: Take medications as prescribed   Attend all scheduled provider appointments Call pharmacy for medication refills 3-7 days in advance of running out of medications Attend church or other social activities Perform all self care activities independently  Perform IADL's  (shopping, preparing meals, housekeeping, managing finances) independently Call provider office for new concerns or questions  check blood sugar at prescribed times: once daily check feet daily for cuts, sores or redness enter blood sugar readings and medication or insulin into daily log take the blood sugar log to all doctor visits take the blood sugar meter to all doctor visits trim toenails straight across fill half of plate with vegetables limit fast food meals to no more than 1 per week manage portion size prepare main meal at home 3 to 5 days each week read food labels for fat, fiber, carbohydrates and portion size set a realistic goal wash and dry feet carefully every day wear comfortable, cotton socks  Follow Up Plan: Telephone follow up appointment with care management team member scheduled for:  11/6//24 at 9 am       COMPLETED: CCM (HYPERLIPIDEMIA) EXPECTED OUTCOME: MONITOR, SELF-MANAGE AND REDUCE SYMPTOMS OF HYPERLIPIDEMIA       Current Barriers:  Knowledge Deficits related to Hyperlipidemia management Chronic Disease Management support and education needs related to Hyperlipidemia, diet  Planned Interventions: Provider established cholesterol goals reviewed; Counseled on importance of regular laboratory monitoring as prescribed; Provided HLD educational materials; Reviewed importance of limiting foods high in cholesterol; Reviewed exercise goals and target of 150 minutes per week;  Symptom Management: Take medications as prescribed   Attend all scheduled provider appointments Call pharmacy for medication refills 3-7 days in advance of running out of medications Attend church or other social activities Perform all self care activities independently  Perform IADL's (shopping, preparing  meals, housekeeping, managing finances) independently Call provider office for new concerns or questions  - call doctor with any symptoms you believe are related to your medicine -  call doctor when you experience any new symptoms - go to all doctor appointments as scheduled - adhere to prescribed diet: heart healthy Continue working out at the gym Follow heart healthy diet, read food labes Bake and broil foods instead of frying  Follow Up Plan: Telephone follow up appointment with care management team member scheduled for:   12/03/22 at 9 am       COMPLETED: CCM (HYPERTENSION) EXPECTED OUTCOME: MONITOR, SELF-MANAGE AND REDUCE SYMPTOMS OF HYPERTENSION       Current Barriers:  Knowledge Deficits related to Hypertension management Chronic Disease Management support and education needs related to Hypertension, diet No Advanced Directives in place- pt requests information be mailed Patient reports she lives with spouse and assists with his care due to he is in poor health, patient states she can call on her daughter at any time for assistance if needed, reports independent with all aspects of her care. Patient reports she has blood pressure cuff but does not monitor blood pressure Patient reports she is getting back into exercising and trying to be more consistent, goes to gym to workout Patient reports 2 falls in the past year and sprained her ankle with one of the falls, no new concerns reported today  Planned Interventions: Evaluation of current treatment plan related to hypertension self management and patient's adherence to plan as established by provider;   Reviewed medications with patient and discussed importance of compliance;  Counseled on the importance of exercise goals with target of 150 minutes per week Discussed plans with patient for ongoing care management follow up and provided patient with direct contact information for care management team; Advised patient, providing education and rationale, to monitor blood pressure daily and record, calling PCP for findings outside established parameters;  Discussed complications of poorly controlled blood pressure  such as heart disease, stroke, circulatory complications, vision complications, kidney impairment, sexual dysfunction;  Reviewed all upcoming scheduled appointments  Reviewed safety precautions Reinforced low sodium diet   Symptom Management: Take medications as prescribed   Attend all scheduled provider appointments Call pharmacy for medication refills 3-7 days in advance of running out of medications Attend church or other social activities Perform all self care activities independently  Perform IADL's (shopping, preparing meals, housekeeping, managing finances) independently Call provider office for new concerns or questions  check blood pressure weekly choose a place to take my blood pressure (home, clinic or office, retail store) write blood pressure results in a log or diary learn about high blood pressure keep a blood pressure log take blood pressure log to all doctor appointments keep all doctor appointments take medications for blood pressure exactly as prescribed report new symptoms to your doctor eat more whole grains, fruits and vegetables, lean meats and healthy fats Follow low sodium diet Read food labels for sodium content Limit fast food fall prevention strategies: change position slowly, use assistive device such as walker or cane (per provider recommendations) when walking, keep walkways clear, have good lighting in room. It is important to contact your provider if you have any falls, maintain muscle strength/tone by exercise per provider recommendations.  Follow Up Plan: Telephone follow up appointment with care management team member scheduled for:  12/03/22 at 9 am           Our next appointment is by telephone on 12/03/22  at 9 am  Please call the care guide team at [phone] if you need to cancel or reschedule your appointment.   If you are experiencing a Mental Health or Behavioral Health Crisis or need someone to talk to, please call the Suicide and  Crisis Lifeline: 988 call the [place] National Suicide Prevention Lifeline: 979-114-2424 or TTY: 940-119-2031 TTY 386-782-9094) to talk to a trained counselor call 1-800-273-TALK (toll free, 24 hour hotline) go to [facility] [address] [place] [phone]) call the [facility] Line: [phone] call 911   The patient verbalized understanding of instructions, educational materials, and care plan provided today and DECLINED offer to receive copy of patient instructions, educational materials, and care plan.   Telephone follow up appointment with care management team member scheduled for: 12/03/22 at 9 am  Irving Shows [facility], BSN [facility] Health/ Ambulatory Care Management [phone]

## 2022-09-15 NOTE — Patient Outreach (Signed)
Care Management   Visit Note  09/15/2022 Name: Candace Lopez MRN: [id number] DOB: [birth year]  Subjective: Candace Lopez is a 72 y.o. year old female who is a primary care patient of [name], DO. The Care Management team was consulted for assistance.      Engaged with patient spoke with patient by telephone.    Goals Addressed             This Visit's Progress    CCM (DIABETES) EXPECTED OUTCOME: MONITOR, SELF- MANAGE AND REDUCE SYMPTOMS OF DIABETES       Current Barriers:  Knowledge Deficits related to Diabetes management Care Coordination needs related to pharmacy needs, medication assistance in a patient with Diabetes Chronic Disease Management support and education needs related to Diabetes No Advanced Directives in place- documents previously mailed Patient reports she checks CBG once daily fasting with readings 150-180, states this is an improvement because readings were near 200 most of the time, pt states she still wants to work towards lowering her blood sugar, recent AIC 6.4 on 07/07/22 Patient reports she does not drink sugary drinks, usually drinks a lot of water Patient reports she has all medications and taking as prescribed  Planned Interventions: Reviewed medications with patient and discussed importance of medication adherence;        Counseled on importance of regular laboratory monitoring as prescribed;        Advised patient, providing education and rationale, to check cbg once daily and record        call provider for findings outside established parameters;       Review of patient status, including review of consultants reports, relevant laboratory and other test results, and medications completed;       Reviewed carbohydrate modified diet/ plate method  Symptom Management: Take medications as prescribed   Attend all scheduled provider appointments Call pharmacy for medication refills 3-7 days in advance of running out of medications Attend  church or other social activities Perform all self care activities independently  Perform IADL's (shopping, preparing meals, housekeeping, managing finances) independently Call provider office for new concerns or questions  check blood sugar at prescribed times: once daily check feet daily for cuts, sores or redness enter blood sugar readings and medication or insulin into daily log take the blood sugar log to all doctor visits take the blood sugar meter to all doctor visits trim toenails straight across fill half of plate with vegetables limit fast food meals to no more than 1 per week manage portion size prepare main meal at home 3 to 5 days each week read food labels for fat, fiber, carbohydrates and portion size set a realistic goal wash and dry feet carefully every day wear comfortable, cotton socks  Follow Up Plan: Telephone follow up appointment with care management team member scheduled for:  11/6//24 at 9 am       COMPLETED: CCM (HYPERLIPIDEMIA) EXPECTED OUTCOME: MONITOR, SELF-MANAGE AND REDUCE SYMPTOMS OF HYPERLIPIDEMIA       Current Barriers:  Knowledge Deficits related to Hyperlipidemia management Chronic Disease Management support and education needs related to Hyperlipidemia, diet  Planned Interventions: Provider established cholesterol goals reviewed; Counseled on importance of regular laboratory monitoring as prescribed; Provided HLD educational materials; Reviewed importance of limiting foods high in cholesterol; Reviewed exercise goals and target of 150 minutes per week;  Symptom Management: Take medications as prescribed   Attend all scheduled provider appointments Call pharmacy for medication refills 3-7 days in advance of running  out of medications Attend church or other social activities Perform all self care activities independently  Perform IADL's (shopping, preparing meals, housekeeping, managing finances) independently Call provider office for new  concerns or questions  - call doctor with any symptoms you believe are related to your medicine - call doctor when you experience any new symptoms - go to all doctor appointments as scheduled - adhere to prescribed diet: heart healthy Continue working out at the gym Follow heart healthy diet, read food labes Bake and broil foods instead of frying  Follow Up Plan: Telephone follow up appointment with care management team member scheduled for:   12/03/22 at 9 am       COMPLETED: CCM (HYPERTENSION) EXPECTED OUTCOME: MONITOR, SELF-MANAGE AND REDUCE SYMPTOMS OF HYPERTENSION       Current Barriers:  Knowledge Deficits related to Hypertension management Chronic Disease Management support and education needs related to Hypertension, diet No Advanced Directives in place- pt requests information be mailed Patient reports she lives with spouse and assists with his care due to he is in poor health, patient states she can call on her daughter at any time for assistance if needed, reports independent with all aspects of her care. Patient reports she has blood pressure cuff but does not monitor blood pressure Patient reports she is getting back into exercising and trying to be more consistent, goes to gym to workout Patient reports 2 falls in the past year and sprained her ankle with one of the falls, no new concerns reported today  Planned Interventions: Evaluation of current treatment plan related to hypertension self management and patient's adherence to plan as established by provider;   Reviewed medications with patient and discussed importance of compliance;  Counseled on the importance of exercise goals with target of 150 minutes per week Discussed plans with patient for ongoing care management follow up and provided patient with direct contact information for care management team; Advised patient, providing education and rationale, to monitor blood pressure daily and record, calling PCP for  findings outside established parameters;  Discussed complications of poorly controlled blood pressure such as heart disease, stroke, circulatory complications, vision complications, kidney impairment, sexual dysfunction;  Reviewed all upcoming scheduled appointments  Reviewed safety precautions Reinforced low sodium diet   Symptom Management: Take medications as prescribed   Attend all scheduled provider appointments Call pharmacy for medication refills 3-7 days in advance of running out of medications Attend church or other social activities Perform all self care activities independently  Perform IADL's (shopping, preparing meals, housekeeping, managing finances) independently Call provider office for new concerns or questions  check blood pressure weekly choose a place to take my blood pressure (home, clinic or office, retail store) write blood pressure results in a log or diary learn about high blood pressure keep a blood pressure log take blood pressure log to all doctor appointments keep all doctor appointments take medications for blood pressure exactly as prescribed report new symptoms to your doctor eat more whole grains, fruits and vegetables, lean meats and healthy fats Follow low sodium diet Read food labels for sodium content Limit fast food fall prevention strategies: change position slowly, use assistive device such as walker or cane (per provider recommendations) when walking, keep walkways clear, have good lighting in room. It is important to contact your provider if you have any falls, maintain muscle strength/tone by exercise per provider recommendations.  Follow Up Plan: Telephone follow up appointment with care management team member scheduled for:  12/03/22 at  9 am           Plan: Telephone follow up appointment with care management team member scheduled for: 12/03/22 at 9 am  Irving Shows [facility], BSN [place]/ Ambulatory Care  Management [phone]

## 2022-10-04 DIAGNOSIS — Z823 Family history of stroke: Secondary | ICD-10-CM | POA: Diagnosis not present

## 2022-10-04 DIAGNOSIS — K219 Gastro-esophageal reflux disease without esophagitis: Secondary | ICD-10-CM | POA: Diagnosis not present

## 2022-10-04 DIAGNOSIS — Z7984 Long term (current) use of oral hypoglycemic drugs: Secondary | ICD-10-CM | POA: Diagnosis not present

## 2022-10-04 DIAGNOSIS — Z7983 Long term (current) use of bisphosphonates: Secondary | ICD-10-CM | POA: Diagnosis not present

## 2022-10-04 DIAGNOSIS — Z8249 Family history of ischemic heart disease and other diseases of the circulatory system: Secondary | ICD-10-CM | POA: Diagnosis not present

## 2022-10-04 DIAGNOSIS — E1142 Type 2 diabetes mellitus with diabetic polyneuropathy: Secondary | ICD-10-CM | POA: Diagnosis not present

## 2022-10-04 DIAGNOSIS — G2581 Restless legs syndrome: Secondary | ICD-10-CM | POA: Diagnosis not present

## 2022-10-04 DIAGNOSIS — E785 Hyperlipidemia, unspecified: Secondary | ICD-10-CM | POA: Diagnosis not present

## 2022-10-04 DIAGNOSIS — Z008 Encounter for other general examination: Secondary | ICD-10-CM | POA: Diagnosis not present

## 2022-10-04 DIAGNOSIS — I10 Essential (primary) hypertension: Secondary | ICD-10-CM | POA: Diagnosis not present

## 2022-10-04 DIAGNOSIS — E1151 Type 2 diabetes mellitus with diabetic peripheral angiopathy without gangrene: Secondary | ICD-10-CM | POA: Diagnosis not present

## 2022-10-04 DIAGNOSIS — Z809 Family history of malignant neoplasm, unspecified: Secondary | ICD-10-CM | POA: Diagnosis not present

## 2022-10-04 DIAGNOSIS — M1990 Unspecified osteoarthritis, unspecified site: Secondary | ICD-10-CM | POA: Diagnosis not present

## 2022-10-08 ENCOUNTER — Telehealth: Payer: Self-pay | Admitting: Family Medicine

## 2022-10-15 ENCOUNTER — Ambulatory Visit (HOSPITAL_COMMUNITY)
Admission: RE | Admit: 2022-10-15 | Discharge: 2022-10-15 | Disposition: A | Discharge status: Home / Self Care | Payer: Medicare HMO | Source: Ambulatory Visit | Attending: Family Medicine | Admitting: Family Medicine

## 2022-10-15 DIAGNOSIS — Z1231 Encounter for screening mammogram for malignant neoplasm of breast: Secondary | ICD-10-CM | POA: Insufficient documentation

## 2022-11-02 ENCOUNTER — Other Ambulatory Visit: Payer: Self-pay | Admitting: Family Medicine

## 2022-11-02 DIAGNOSIS — E1140 Type 2 diabetes mellitus with diabetic neuropathy, unspecified: Secondary | ICD-10-CM

## 2022-11-05 ENCOUNTER — Ambulatory Visit (INDEPENDENT_AMBULATORY_CARE_PROVIDER_SITE_OTHER): Payer: Medicare HMO

## 2022-11-05 ENCOUNTER — Ambulatory Visit: Payer: Medicare HMO | Admitting: Family Medicine

## 2022-11-05 ENCOUNTER — Encounter: Payer: Self-pay | Admitting: Family Medicine

## 2022-11-05 VITALS — BP 126/75 | HR 81 | Temp 98.5°F | Ht 65.0 in | Wt 158.0 lb

## 2022-11-05 DIAGNOSIS — G8929 Other chronic pain: Secondary | ICD-10-CM

## 2022-11-05 DIAGNOSIS — M546 Pain in thoracic spine: Secondary | ICD-10-CM | POA: Diagnosis not present

## 2022-11-05 DIAGNOSIS — Z23 Encounter for immunization: Secondary | ICD-10-CM | POA: Diagnosis not present

## 2022-11-05 DIAGNOSIS — M5134 Other intervertebral disc degeneration, thoracic region: Secondary | ICD-10-CM | POA: Diagnosis not present

## 2022-11-05 DIAGNOSIS — M4804 Spinal stenosis, thoracic region: Secondary | ICD-10-CM | POA: Diagnosis not present

## 2022-11-05 NOTE — Progress Notes (Signed)
Subjective: CC: Back pain PCP: [name], DO NWG:Candace Lopez is a 72 y.o. female presenting to clinic today for:  1.  Thoracic back pain Patient reports that she has had quite a long history of thoracic back pain.  When she had some dental work done she was placed on 800 mg of ibuprofen and this did seem to help.  Here, she is very reluctant to take any type of medication regularly so she has not been utilizing this at all.  No preceding injury.  Pain is worse with bending forward.  She reports no weakness in her upper extremities or sensory changes.  She exercises regularly but notes has been very limited due to this upper back pain.   ROS: Per HPI  Allergies  Allergen Reactions   Tape Rash   Past Medical History:  Diagnosis Date   DDD (degenerative disc disease), lumbar    Diabetes mellitus without complication (HCC)    GERD (gastroesophageal reflux disease)    Hyperlipidemia    Hypertension    Urge incontinence of urine     Current Outpatient Medications:    alendronate (FOSAMAX) 70 MG tablet, Take 1 tablet (70 mg total) by mouth every 7 (seven) days. Take with a full glass of water on an empty stomach., Disp: 12 tablet, Rfl: 3   Alpha-Lipoic Acid 600 MG CAPS, Take 1 capsule (600 mg total) by mouth daily. For diabetic neuropathy, Disp: 90 capsule, Rfl: 3   aspirin EC 81 MG tablet, Take 81 mg by mouth in the morning and at bedtime. Swallow whole., Disp: , Rfl:    atorvastatin (LIPITOR) 40 MG tablet, TAKE 1 TABLET BY MOUTH EVERYDAY AT BEDTIME, Disp: 90 tablet, Rfl: 1   Blood Glucose Monitoring Suppl (ONE TOUCH ULTRA 2) w/Device KIT, UAD to check BGs E11.9, Disp: 1 kit, Rfl: 0   famotidine (PEPCID) 20 MG tablet, Take 1 tablet (20 mg total) by mouth 2 (two) times daily as needed for heartburn or indigestion (start night of injection and continue for 2-3 days following.)., Disp: 180 tablet, Rfl: 3   glipiZIDE (GLUCOTROL XL) 5 MG 24 hr tablet, TAKE 1 TABLET (5 MG TOTAL)  BY MOUTH DAILY WITH BREAKFAST. FOR FASTING SUGAR >200., Disp: 90 tablet, Rfl: 0   lisinopril (ZESTRIL) 10 MG tablet, TAKE 1 TABLET (10 MG TOTAL) BY MOUTH IN THE MORNING AND AT BEDTIME. (STOP TAKING LISINOPRIL 20MG ), Disp: 180 tablet, Rfl: 1   metFORMIN (GLUCOPHAGE-XR) 500 MG 24 hr tablet, TAKE 2 TABLETS BY MOUTH EVERY DAY WITH BREAKFAST, Disp: 180 tablet, Rfl: 0   metoprolol tartrate (LOPRESSOR) 25 MG tablet, TAKE 1 TABLET BY MOUTH TWICE A DAY, Disp: 180 tablet, Rfl: 1   OneTouch Delica Lancets 30G MISC, Check BS BID Dx E11.40, Disp: 200 each, Rfl: 3   ONETOUCH ULTRA test strip, CHECK BLOOD SUGAR TWICE DAILY DX 11.40, Disp: 200 strip, Rfl: 4   pramipexole (MIRAPEX) 0.25 MG tablet, TAKE 1 TABLET 2 HOURS BEFORE BEDTIME FOR RESTLESS LEGS, Disp: 90 tablet, Rfl: 1   Semaglutide,0.25 or 0.5MG /DOS, (OZEMPIC, 0.25 OR 0.5 MG/DOSE,) 2 MG/3ML SOPN, Inject 0.5 mg into the skin every 7 (seven) days., Disp: 9 mL, Rfl: 3   TURMERIC PO, Take 1 capsule by mouth 2 (two) times daily. , Disp: , Rfl:    zolpidem (AMBIEN) 5 MG tablet, take 1 tablet(5 mg total) by mouth at bedtime as needed for sleep., Disp: 30 tablet, Rfl: 5 Social History   Socioeconomic History   Marital  status: Married    Spouse name: Chanetta Marshall   Number of children: 2   Years of education: Not on file   Highest education level: Not on file  Occupational History   Occupation: retired  Tobacco Use   Smoking status: Never   Smokeless tobacco: Never  Vaping Use   Vaping status: Never Used  Substance and Sexual Activity   Alcohol use: No   Drug use: No   Sexual activity: Not on file  Other Topics Concern   Not on file  Social History Narrative   2 children  - son lives in [place], daughter 15 minutes away.   09/09/21 - She is having to stay home more to take care of husband who is not in good health   Social Determinants of Health   Financial Resource Strain: Low Risk  (05/21/2022)   Overall Financial Resource Strain (CARDIA)     Difficulty of Paying Living Expenses: Not hard at all  Food Insecurity: No Food Insecurity (05/21/2022)   Hunger Vital Sign    Worried About Running Out of Food in the Last Year: Never true    Ran Out of Food in the Last Year: Never true  Transportation Needs: No Transportation Needs (05/21/2022)   PRAPARE - Administrator, Civil Service (Medical): No    Lack of Transportation (Non-Medical): No  Physical Activity: Insufficiently Active (05/21/2022)   Exercise Vital Sign    Days of Exercise per Week: 3 days    Minutes of Exercise per Session: 30 min  Stress: No Stress Concern Present (05/21/2022)   [employer] of Occupational Health - Occupational Stress Questionnaire    Feeling of Stress : Not at all  Social Connections: Moderately Isolated (05/21/2022)   Social Connection and Isolation Panel [NHANES]    Frequency of Communication with Friends and Family: More than three times a week    Frequency of Social Gatherings with Friends and Family: More than three times a week    Attends Religious Services: Never    Database administrator or Organizations: No    Attends Banker Meetings: Never    Marital Status: Married  Catering manager Violence: Not At Risk (05/21/2022)   Humiliation, Afraid, Rape, and Kick questionnaire    Fear of Current or Ex-Partner: No    Emotionally Abused: No    Physically Abused: No    Sexually Abused: No   Family History  Problem Relation Age of Onset   Cancer Mother        lung cancer   Cancer Father        lymphnodes   Diabetes Sister    Lung cancer Sister    Diabetes Brother    Cancer Maternal Grandfather    Heart disease Paternal Grandfather    Diabetes Son    Cancer Maternal Aunt        throat   Cancer Paternal Uncle        unknown    Cancer Other        breast   Colon cancer Neg Hx     Objective: Office vital signs reviewed. BP 126/75   Pulse 81   Temp 98.5 F (36.9 C)   Ht 5\' 5"  (1.651 m)   Wt 158 lb (71.7  kg)   SpO2 97%   BMI 26.29 kg/m   Physical Examination:  General: Awake, alert, well nourished, No acute distress MSK:  Thoracic spine: Her range of motion is preserved but she has  pain with flexion.  No midline tenderness palpation.  No paraspinal muscle tenderness to palpation.  Her range of motion of bilateral upper extremities is normal and painless.  No results found.  Assessment/ Plan: 72 y.o. female   Chronic bilateral thoracic back pain - Plan: DG Thoracic Spine 2 View, Ambulatory referral to Physical Therapy  Encounter for immunization - Plan: Flu Vaccine Trivalent High Dose (Fluad)  Personal review of plain films shows some degenerative changes throughout the spine.  I cannot appreciate anything that looks specifically like a narrowed disc space.  I have placed referral to physical therapy.  We discussed consideration for TENS unit.  Recommend Salonpas patches.  Okay to use NSAID but avoid other NSAIDs if using.  Follow-up if not improving and we can consider advanced imaging   [name], DO [facility] [phone]

## 2022-11-05 NOTE — Patient Instructions (Signed)
Salonpas patches Heat ok Referral to physical therapy placed Will contact you with xray results once available  Please do not take any other NSAIDs (ibuprofen/Motrin/Advil, naproxen/Aleve, meloxicam/Mobic, Voltaren/diclofenac). Please make sure to eat a meal when taking this medication.   Caution: If you have a history of acid reflux/indigestion, I recommend that you take an antacid (such as Prilosec, Prevacid) daily while on the NSAID. If you have a history of bleeding disorder, gastric ulcer, are on a blood thinner (like warfarin/Coumadin, Xarelto, Eliquis, etc) please do not take NSAID. If you have ever had a heart attack, you should not take NSAIDs.

## 2022-11-25 ENCOUNTER — Ambulatory Visit (INDEPENDENT_AMBULATORY_CARE_PROVIDER_SITE_OTHER): Payer: Medicare HMO | Admitting: Family

## 2022-11-25 ENCOUNTER — Encounter: Payer: Self-pay | Admitting: Family

## 2022-11-25 VITALS — BP 123/73 | HR 66 | Temp 97.6°F | Ht 65.0 in | Wt 157.0 lb

## 2022-11-25 DIAGNOSIS — J208 Acute bronchitis due to other specified organisms: Secondary | ICD-10-CM | POA: Diagnosis not present

## 2022-11-25 DIAGNOSIS — B9689 Other specified bacterial agents as the cause of diseases classified elsewhere: Secondary | ICD-10-CM | POA: Diagnosis not present

## 2022-11-25 MED ORDER — DOXYCYCLINE HYCLATE 100 MG PO TABS
100.0000 mg | ORAL_TABLET | Freq: Two times a day (BID) | ORAL | 0 refills | Status: DC
Start: 2022-11-25 — End: 2022-12-04

## 2022-11-25 MED ORDER — BENZONATATE 200 MG PO CAPS
200.0000 mg | ORAL_CAPSULE | Freq: Three times a day (TID) | ORAL | 1 refills | Status: DC | PRN
Start: 2022-11-25 — End: 2022-12-04

## 2022-11-25 NOTE — Patient Instructions (Signed)

## 2022-11-25 NOTE — Progress Notes (Signed)
Subjective:    Patient ID: Candace Lopez, female    DOB: Sep 04, 1950, 72 y.o.   MRN: [id number]  Chief Complaint  Patient presents with   Nasal Congestion    X 10 DAYS - COVID OTC MEDS NOT HELPING.   Pt presents to the office today with congestion and sinus pressure for the last 10 days. Reports COVID test at home was negative. Sinusitis This is a new problem. The current episode started 1 to 4 weeks ago. The problem has been gradually improving since onset. The maximum temperature recorded prior to her arrival was 101 - 101.9 F. Her pain is at a severity of 1/10. The pain is mild. Associated symptoms include congestion, coughing and headaches. Pertinent negatives include no chills, ear pain, shortness of breath, sinus pressure, sneezing or sore throat. Past treatments include acetaminophen and oral decongestants. The treatment provided mild relief.  Cough This is a new problem. The current episode started 1 to 4 weeks ago. The problem has been unchanged. The cough is Productive of sputum. Associated symptoms include headaches. Pertinent negatives include no chills, ear pain, sore throat, shortness of breath or wheezing.      Review of Systems  Constitutional:  Negative for chills.  HENT:  Positive for congestion. Negative for ear pain, sinus pressure, sneezing and sore throat.   Respiratory:  Positive for cough. Negative for shortness of breath and wheezing.   Neurological:  Positive for headaches.  All other systems reviewed and are negative.      Objective:   Physical Exam Vitals reviewed.  Constitutional:      General: She is not in acute distress.    Appearance: She is well-developed.  HENT:     Head: Normocephalic and atraumatic.     Right Ear: Tympanic membrane normal.     Left Ear: Tympanic membrane normal.  Eyes:     Pupils: Pupils are equal, round, and reactive to light.  Neck:     Thyroid: No thyromegaly.  Cardiovascular:     Rate and Rhythm: Normal rate and  regular rhythm.     Heart sounds: Normal heart sounds. No murmur heard. Pulmonary:     Effort: Pulmonary effort is normal. No respiratory distress.     Breath sounds: Rhonchi present. No wheezing.  Abdominal:     General: Bowel sounds are normal. There is no distension.     Palpations: Abdomen is soft.     Tenderness: There is no abdominal tenderness.  Musculoskeletal:        General: No tenderness. Normal range of motion.     Cervical back: Normal range of motion and neck supple.  Skin:    General: Skin is warm and dry.  Neurological:     Mental Status: She is alert and oriented to person, place, and time.     Cranial Nerves: No cranial nerve deficit.     Deep Tendon Reflexes: Reflexes are normal and symmetric.  Psychiatric:        Behavior: Behavior normal.        Thought Content: Thought content normal.        Judgment: Judgment normal.       BP 123/73   Pulse 66   Temp 97.6 F (36.4 C) (Temporal)   Ht 5\' 5"  (1.651 m)   Wt 157 lb (71.2 kg)   SpO2 96%   BMI 26.13 kg/m      Assessment & Plan:  Candace Lopez comes in today with chief complaint  of Nasal Congestion (X 10 DAYS - COVID OTC MEDS NOT HELPING.)   Diagnosis and orders addressed:  1. Acute bacterial bronchitis - Take meds as prescribed - Use a cool mist humidifier  -Use saline nose sprays frequently -Force fluids -For any cough or congestion  Use plain Mucinex- regular strength or max strength is fine -For fever or aces or pains- take tylenol or ibuprofen. -Throat lozenges if help -Follow up if symptoms worsen or do not improve  - doxycycline (VIBRA-TABS) 100 MG tablet; Take 1 tablet (100 mg total) by mouth 2 (two) times daily.  Dispense: 20 tablet; Refill: 0 - benzonatate (TESSALON) 200 MG capsule; Take 1 capsule (200 mg total) by mouth 3 (three) times daily as needed.  Dispense: 30 capsule; Refill: 1    Jannifer Rodney, FNP

## 2022-12-01 ENCOUNTER — Ambulatory Visit: Payer: Medicare HMO

## 2022-12-03 ENCOUNTER — Other Ambulatory Visit: Payer: Self-pay

## 2022-12-03 ENCOUNTER — Other Ambulatory Visit: Payer: Self-pay | Admitting: *Deleted

## 2022-12-03 NOTE — Patient Instructions (Signed)
Visit Information  Thank you for taking time to visit with me today. Please don't hesitate to contact me if I can be of assistance to you before our next scheduled telephone appointment.  Following are the goals we discussed today:   Goals Addressed             This Visit's Progress    CCM (DIABETES) EXPECTED OUTCOME: MONITOR, SELF- MANAGE AND REDUCE SYMPTOMS OF DIABETES       Current Barriers:  Knowledge Deficits related to Diabetes management Care Coordination needs related to pharmacy needs, medication assistance in a patient with Diabetes Chronic Disease Management support and education needs related to Diabetes No Advanced Directives in place   Planned Interventions: Lab Results  Component Value Date   HGBA1C 6.4 (H) 07/07/2022   HGBA1C 7.7 (H) 04/04/2022   HGBA1C 8.5 (H) 12/30/2021     Provided education to patient about basic DM disease process; Reviewed medications with patient and discussed importance of medication adherence;        Counseled on importance of regular laboratory monitoring as prescribed;        Discussed plans with patient for ongoing care management follow up and provided patient with direct contact information for care management team;      Provided patient with written educational materials related to hypo and hyperglycemia and importance of correct treatment;       Advised patient, providing education and rationale, to check cbg as recommended by pcp  and record        call provider for findings outside established parameters;       Referral made to pharmacy team for assistance with Ozempic; Pharm D 01-06-2024 at 1pm for 2025 pap Review of patient status, including review of consultants reports, relevant laboratory and other test results, and medications completed;       Screening for signs and symptoms of depression related to chronic disease state;        Assessed social determinant of health barriers;        Reviewed carbohydrate modified diet/  plate method Recently diagnosed with acute bronchitis, taking prescribed medications as ordered. States she is feeling better & states her drainage is started to clear. Her PT has been limited recently due to her breathing but she is hopeful to get started again with PT.   Symptom Management: Take medications as prescribed   Attend all scheduled provider appointments Call pharmacy for medication refills 3-7 days in advance of running out of medications Attend church or other social activities Perform all self care activities independently  Perform IADL's (shopping, preparing meals, housekeeping, managing finances) independently Call provider office for new concerns or questions  check blood sugar at prescribed times: once daily check feet daily for cuts, sores or redness enter blood sugar readings and medication or insulin into daily log take the blood sugar log to all doctor visits take the blood sugar meter to all doctor visits trim toenails straight across fill half of plate with vegetables limit fast food meals to no more than 1 per week manage portion size prepare main meal at home 3 to 5 days each week read food labels for fat, fiber, carbohydrates and portion size set a realistic goal wash and dry feet carefully every day wear comfortable, cotton socks  Follow Up Plan: Telephone follow up appointment with care management team member scheduled for:  02-04-2023 at 9:00 am           Our next appointment is by  telephone on 02-04-2023 at 9:00 am  Please call the care guide team at [phone] if you need to cancel or reschedule your appointment.   If you are experiencing a Mental Health or Behavioral Health Crisis or need someone to talk to, please call the Suicide and Crisis Lifeline: 988 call the [place] National Suicide Prevention Lifeline: 339 403 4357 or TTY: (929) 449-3484 TTY (386) 837-5853) to talk to a trained counselor call 1-800-273-TALK (toll free, 24 hour hotline) call  the [facility]: [phone] call 911   Patient verbalizes understanding of instructions and care plan provided today and agrees to view in MyChart. Active MyChart status and patient understanding of how to access instructions and care plan via MyChart confirmed with patient.     Telephone follow up appointment with care management team member scheduled for: 02-04-2023 at 9:00 am  Danise Edge, BSN RN RN Care Manager  [facility]  Ambulatory Care Management  Direct Number: [phone]

## 2022-12-03 NOTE — Patient Outreach (Signed)
Care Management   Visit Note  12/03/2022 Name: Candace Lopez MRN: [id number] DOB: [birth year]  Subjective: Candace Lopez is a 72 y.o. year old female who is a primary care patient of [name], DO. The Care Management team was consulted for assistance.      Engaged with patient spoke with patient by telephone.    Goals Addressed             This Visit's Progress    CCM (DIABETES) EXPECTED OUTCOME: MONITOR, SELF- MANAGE AND REDUCE SYMPTOMS OF DIABETES       Current Barriers:  Knowledge Deficits related to Diabetes management Care Coordination needs related to pharmacy needs, medication assistance in a patient with Diabetes Chronic Disease Management support and education needs related to Diabetes No Advanced Directives in place   Planned Interventions: Lab Results  Component Value Date   HGBA1C 6.4 (H) 07/07/2022   HGBA1C 7.7 (H) 04/04/2022   HGBA1C 8.5 (H) 12/30/2021     Provided education to patient about basic DM disease process; Reviewed medications with patient and discussed importance of medication adherence;        Counseled on importance of regular laboratory monitoring as prescribed;        Discussed plans with patient for ongoing care management follow up and provided patient with direct contact information for care management team;      Provided patient with written educational materials related to hypo and hyperglycemia and importance of correct treatment;       Advised patient, providing education and rationale, to check cbg as recommended by pcp  and record        call provider for findings outside established parameters;       Referral made to pharmacy team for assistance with Ozempic; Pharm D 01-06-2024 at 1pm for 2025 pap Review of patient status, including review of consultants reports, relevant laboratory and other test results, and medications completed;       Screening for signs and symptoms of depression related to chronic disease state;         Assessed social determinant of health barriers;        Reviewed carbohydrate modified diet/ plate method Recently diagnosed with acute bronchitis, taking prescribed medications as ordered. States she is feeling better & states her drainage is started to clear. Her PT has been limited recently due to her breathing but she is hopeful to get started again with PT.   Symptom Management: Take medications as prescribed   Attend all scheduled provider appointments Call pharmacy for medication refills 3-7 days in advance of running out of medications Attend church or other social activities Perform all self care activities independently  Perform IADL's (shopping, preparing meals, housekeeping, managing finances) independently Call provider office for new concerns or questions  check blood sugar at prescribed times: once daily check feet daily for cuts, sores or redness enter blood sugar readings and medication or insulin into daily log take the blood sugar log to all doctor visits take the blood sugar meter to all doctor visits trim toenails straight across fill half of plate with vegetables limit fast food meals to no more than 1 per week manage portion size prepare main meal at home 3 to 5 days each week read food labels for fat, fiber, carbohydrates and portion size set a realistic goal wash and dry feet carefully every day wear comfortable, cotton socks  Follow Up Plan: Telephone follow up appointment with care management team member scheduled for:  02-04-2023 at 9:00 am             Consent to Services:  Patient was given information about care management services, agreed to services, and gave verbal consent to participate.   Plan: Telephone follow up appointment with care management team member scheduled for: 02-04-2023 at 9:00 am  Danise Edge, BSN RN RN Care Manager  [facility]  Ambulatory Care Management  Direct Number: [phone]

## 2022-12-04 ENCOUNTER — Ambulatory Visit: Payer: Medicare HMO | Admitting: Family

## 2022-12-04 ENCOUNTER — Ambulatory Visit: Payer: Self-pay | Admitting: Family Medicine

## 2022-12-04 ENCOUNTER — Encounter: Payer: Self-pay | Admitting: Family

## 2022-12-04 ENCOUNTER — Ambulatory Visit (INDEPENDENT_AMBULATORY_CARE_PROVIDER_SITE_OTHER): Payer: Medicare HMO

## 2022-12-04 VITALS — BP 113/65 | HR 81 | Temp 97.6°F | Ht 65.0 in | Wt 156.2 lb

## 2022-12-04 DIAGNOSIS — E1140 Type 2 diabetes mellitus with diabetic neuropathy, unspecified: Secondary | ICD-10-CM

## 2022-12-04 DIAGNOSIS — R059 Cough, unspecified: Secondary | ICD-10-CM | POA: Diagnosis not present

## 2022-12-04 DIAGNOSIS — R051 Acute cough: Secondary | ICD-10-CM

## 2022-12-04 DIAGNOSIS — B9689 Other specified bacterial agents as the cause of diseases classified elsewhere: Secondary | ICD-10-CM | POA: Diagnosis not present

## 2022-12-04 DIAGNOSIS — R1110 Vomiting, unspecified: Secondary | ICD-10-CM | POA: Diagnosis not present

## 2022-12-04 DIAGNOSIS — K224 Dyskinesia of esophagus: Secondary | ICD-10-CM | POA: Diagnosis not present

## 2022-12-04 DIAGNOSIS — R079 Chest pain, unspecified: Secondary | ICD-10-CM | POA: Diagnosis not present

## 2022-12-04 DIAGNOSIS — J208 Acute bronchitis due to other specified organisms: Secondary | ICD-10-CM | POA: Diagnosis not present

## 2022-12-04 MED ORDER — LEVOFLOXACIN 500 MG PO TABS: 500.0000 mg | ORAL_TABLET | Freq: Every day | ORAL | 0 refills | Status: AC

## 2022-12-04 MED ORDER — BENZONATATE 200 MG PO CAPS
200.0000 mg | ORAL_CAPSULE | Freq: Three times a day (TID) | ORAL | 1 refills | Status: AC | PRN
Start: 2022-12-04 — End: ?

## 2022-12-04 MED ORDER — PREDNISONE 10 MG (21) PO TBPK
ORAL_TABLET | ORAL | 0 refills | Status: DC
Start: 2022-12-04 — End: 2023-01-06

## 2022-12-04 MED ORDER — ONDANSETRON HCL 4 MG PO TABS: 4.0000 mg | ORAL_TABLET | Freq: Three times a day (TID) | ORAL | 0 refills | Status: DC | PRN

## 2022-12-04 MED ORDER — GI COCKTAIL ~~LOC~~: 30.0000 mL | Freq: Two times a day (BID) | ORAL | 1 refills | Status: DC

## 2022-12-04 NOTE — Telephone Encounter (Signed)
Reason for Disposition  [1] Chest pain lasts < 5 minutes AND [2] NO chest pain or cardiac symptoms (e.g., breathing difficulty, sweating) now  (Exception: Chest pains that last only a few seconds.)  Answer Assessment - Initial Assessment Questions 1. LOCATION: "Where does it hurt?"   Pain is right across her chest  at the top     *No Answer* 2. RADIATION: "Does the pain go anywhere else?" (e.g., into neck, jaw, arms, back) no Only across chest  4-5 seconds     *No Answer* 3. ONSET: "When did the chest pain begin?" (Minutes, hours or days)      *No Answer* 4-5 seconds  - had the same on Monday had chest pain that seem like forever 4. PATTERN: "Does the pain come and go, or has it been constant since it started?"  "Does it get worse with exertion?"  Just came for few seconds  then next her tongue went numb 2-3 seconds     *No Answer* 5. DURATION: "How long does it last" (e.g., seconds, minutes, hours)     2-3 6. SEVERITY: "How bad is the pain?"  (e.g., Scale 1-10; mild, moderate, or severe)    - MILD (1-3): doesn't interfere with normal activities     - MODERATE (4-7): interferes with normal activities or awakens from sleep    - SEVERE (8-10): excruciating pain, unable to do any normal activities       *No Answer* 7. CARDIAC RISK FACTORS: "Do you have any history of heart problems or risk factors for heart disease?" ( prior heart attack; diabetes, high blood pressure, high cholesterol,  or strong family history of heart disease)     *No Answer* 8. PULMONARY RISK FACTORS: "Do you have any history of lung disease?"  (e.g., blood clots in lung, asthma, emphysema, birth control pills)     *No Answer* no 9. CAUSE: "What do you think is causing the chest pain?"     *No Answer* Thinks it was the last dose of the doxycyline and benzoate 10. OTHER SYMPTOMS: "Do you have any other symptoms?" (e.g., dizziness, nausea, vomiting, sweating, fever, difficulty breathing, cough)       *No Answer* vomitng when  she had the chest  Protocols used: Chest Pain-A-AH

## 2022-12-04 NOTE — Progress Notes (Signed)
Subjective:    Patient ID: Candace Lopez, female    DOB: 1950-10-28, 72 y.o.   MRN: [id number]  Chief Complaint  Patient presents with   Chest Pain   Pt presents to the office today with chest pain that started Monday. She reports she was throwing up and started having chest pain. Reports it lasted a few mins but was intense. Then resolved. She threw up again and had another episode of chest pain that last a few seconds.   She has had a cough for 3 weeks and having thick mucus. She was seen on 11/25/22 and given doxycyline and tessalon with mild relief.  Chest Pain  This is a new problem. The current episode started in the past 7 days. The problem occurs intermittently. The pain is at a severity of 10/10. Quality: sharp. Associated symptoms include a cough and shortness of breath. Pertinent negatives include no fever.  Cough This is a new problem. The current episode started 1 to 4 weeks ago. The problem has been gradually worsening. The cough is Productive of sputum. Associated symptoms include chest pain, nasal congestion, postnasal drip, shortness of breath and wheezing. Pertinent negatives include no ear congestion, ear pain or fever. She has tried rest and OTC cough suppressant for the symptoms. The treatment provided mild relief. There is no history of asthma or COPD.      Review of Systems  Constitutional:  Negative for fever.  HENT:  Positive for postnasal drip. Negative for ear pain.   Respiratory:  Positive for cough, shortness of breath and wheezing.   Cardiovascular:  Positive for chest pain.  All other systems reviewed and are negative.      Objective:   Physical Exam Vitals reviewed.  Constitutional:      General: She is not in acute distress.    Appearance: She is well-developed.  HENT:     Head: Normocephalic and atraumatic.     Right Ear: Tympanic membrane normal.     Left Ear: Tympanic membrane normal.  Eyes:     Pupils: Pupils are equal, round, and reactive  to light.  Neck:     Thyroid: No thyromegaly.  Cardiovascular:     Rate and Rhythm: Normal rate and regular rhythm.     Heart sounds: Normal heart sounds. No murmur heard. Pulmonary:     Effort: Pulmonary effort is normal. No respiratory distress.     Breath sounds: Rhonchi present. No wheezing.  Abdominal:     General: Bowel sounds are normal. There is no distension.     Palpations: Abdomen is soft.     Tenderness: There is no abdominal tenderness.  Musculoskeletal:        General: No tenderness. Normal range of motion.     Cervical back: Normal range of motion and neck supple.  Skin:    General: Skin is warm and dry.  Neurological:     Mental Status: She is alert and oriented to person, place, and time.     Cranial Nerves: No cranial nerve deficit.     Deep Tendon Reflexes: Reflexes are normal and symmetric.  Psychiatric:        Behavior: Behavior normal.        Thought Content: Thought content normal.        Judgment: Judgment normal.     BP 113/65   Pulse 81   Temp 97.6 F (36.4 C) (Temporal)   Ht 5\' 5"  (1.651 m)   Wt 156 lb 3.2  oz (70.9 kg)   SpO2 97%   BMI 25.99 kg/m        Assessment & Plan:  Candace Lopez comes in today with chief complaint of Chest Pain   Diagnosis and orders addressed:  1. Chest pain, unspecified type - EKG 12-Lead - Alum & Mag Hydroxide-Simeth (GI COCKTAIL) SUSP suspension; Take 30 mLs by mouth 2 (two) times daily. Shake well.  Dispense: 200 mL; Refill: 1  2. Acute cough  3. Vomiting, unspecified vomiting type, unspecified whether nausea present - ondansetron (ZOFRAN) 4 MG tablet; Take 1 tablet (4 mg total) by mouth every 8 (eight) hours as needed for nausea or vomiting.  Dispense: 20 tablet; Refill: 0  4. Acute bacterial bronchitis - predniSONE (STERAPRED UNI-PAK 21 TAB) 10 MG (21) TBPK tablet; Use as directed  Dispense: 21 tablet; Refill: 0 - benzonatate (TESSALON) 200 MG capsule; Take 1 capsule (200 mg total) by mouth 3  (three) times daily as needed.  Dispense: 60 capsule; Refill: 1 - DG Chest 2 View - levofloxacin (LEVAQUIN) 500 MG tablet; Take 1 tablet (500 mg total) by mouth daily for 7 days.  Dispense: 7 tablet; Refill: 0  5. Esophageal spasm - Alum & Mag Hydroxide-Simeth (GI COCKTAIL) SUSP suspension; Take 30 mLs by mouth 2 (two) times daily. Shake well.  Dispense: 200 mL; Refill: 1  6. Type 2 diabetes mellitus with diabetic neuropathy, without long-term current use of insulin (HCC) Continue current medications A1C at goal  Strict low carb diet    EKG stable  I think her chest pain is from esophageal spasm given it has only occurred after vomiting. Will give GI cocktail to take if happens again.  Chest x-ray pending, will come back after lunch to get completed Start prednisone and Levaquin Strict low carb diet  Follow up in 1 week if not improve or worsen    Jannifer Rodney, FNP

## 2022-12-04 NOTE — Patient Instructions (Signed)

## 2022-12-08 ENCOUNTER — Telehealth: Payer: Self-pay | Admitting: *Deleted

## 2022-12-09 ENCOUNTER — Ambulatory Visit: Payer: Medicare HMO | Admitting: Physical Therapy

## 2022-12-10 ENCOUNTER — Other Ambulatory Visit: Payer: Self-pay

## 2022-12-10 ENCOUNTER — Ambulatory Visit: Payer: Medicare HMO | Attending: Family Medicine | Admitting: Physical Therapy

## 2022-12-10 DIAGNOSIS — M546 Pain in thoracic spine: Secondary | ICD-10-CM | POA: Diagnosis not present

## 2022-12-10 DIAGNOSIS — G8929 Other chronic pain: Secondary | ICD-10-CM | POA: Diagnosis not present

## 2022-12-10 NOTE — Therapy (Signed)
OUTPATIENT PHYSICAL THERAPY THORACOLUMBAR EVALUATION   Patient Name: Candace Lopez MRN: [id number] DOB:[birth year], 72 y.o., female Today's Date: 12/10/2022  END OF SESSION:  PT End of Session - 12/10/22 1028     Visit Number 1    Number of Visits 12    Date for PT Re-Evaluation 01/21/23    PT Start Time 1002    PT Stop Time 1050    PT Time Calculation (min) 48 min    Activity Tolerance Patient tolerated treatment well    Behavior During Therapy WFL for tasks assessed/performed             Past Medical History:  Diagnosis Date   DDD (degenerative disc disease), lumbar    Diabetes mellitus without complication (HCC)    GERD (gastroesophageal reflux disease)    Hyperlipidemia    Hypertension    Urge incontinence of urine    Past Surgical History:  Procedure Laterality Date   ABDOMINAL HYSTERECTOMY     arm surgery     COLONOSCOPY N/A 09/22/2018   Procedure: COLONOSCOPY;  Surgeon: Malissa Hippo, MD;  Location: [facility];  Service: Endoscopy;  Laterality: N/A;  830   FOOT SURGERY     KNEE SURGERY     Patient Active Problem List   Diagnosis Date Noted   Mild obesity 09/06/2020   Degenerative disk disease 06/30/2017   Mild obstructive sleep apnea 06/30/2017   Type 2 diabetes mellitus with diabetic neuropathy, unspecified (HCC) 03/14/2016   Hypertension associated with diabetes (HCC) 03/14/2016   Hyperlipidemia associated with type 2 diabetes mellitus (HCC) 03/14/2016   Restless leg syndrome, familial, uncontrolled 03/14/2016   Urge incontinence 06/28/2011   Left knee pain 01/23/2010    REFERRING PROVIDER: Delynn Flavin DO  REFERRING DIAG: Chronic bilateral thoracic back pain.  Rationale for Evaluation and Treatment: Rehabilitation  THERAPY DIAG:  Pain in thoracic spine  ONSET DATE: ~6 months.  SUBJECTIVE:                                                                                                                                                                                            SUBJECTIVE STATEMENT: The patient presents to the clinic with c/o bilateral mid-back pain that has been going on for about 6 months.  She is not sure really what brought this on.  She states when she bends over and then straightens back up she feels an intense sharp pain that will radiates toward her ribs.  Heat helps decrease her pain.  PERTINENT HISTORY:  Osteopenia.  PAIN:  Are you having pain? Yes: NPRS scale: 3-4/10 Pain location: Mid-back into  ribs. Pain description: Ache, sharp. Aggravating factors: Movement especially straightening spine after bending over. Relieving factors: Heat.  PRECAUTIONS: None   WEIGHT BEARING RESTRICTIONS: No  FALLS:  Has patient fallen in last 6 months? Yes. Number of falls 2.  Lost footing on ramp.  LIVING ENVIRONMENT: Lives with: lives with their spouse Lives in: House/apartment Has following equipment at home: None  OCCUPATION: Retired.  PLOF: Independent  PATIENT GOALS: She would like to get back to working out.  OBJECTIVE:  Note: Objective measures were completed at Evaluation unless otherwise noted.  DIAGNOSTIC FINDINGS:   11/28/22:  Mild degenerative disc disease in the midthoracic spine.    POSTURE: rounded shoulders and forward head  PALPATION: Mild c/o mid-back pain between shoulder blades and into lower thoracic region but her CC is radiation of pain into the ribs.  UE ROM:   WNL.  UE STRENGTH:   Normal bilateral UE strength.   DTR's:  Normal bilateral UE DTR's.  GAIT:  WNL.  TODAY'S TREATMENT:                                                                                                                              DATE: HMP and IFC at 80-150 Hz on 40% scan x 20 minutes to affected thoracic region.  Normal modality response following removal of modality.    PATIENT EDUCATION:  Education details: See below. Person educated: Patient Education method: Software engineer Education comprehension: verbalized understanding  HOME EXERCISE PROGRAM: HOME EXERCISE PROGRAM Created by [place] Delilah Mulgrew Nov 13th, 2024 View at [email or web address] using code: 5PACT67  Page 1 of 1 1 Exercise Scapular Retraction Scapular Retraction Start: Position your arm at 90 degrees by your side with Theraband in hand as pictured. Movement: Against the resistance of the band, squeeze your shoulder blades together as you stick your chest out. Slow and controlled movement. return to start position. *Note-you should not be pulling with your arms, this will only round your shoulders. The movement we want her is initiated by your shoulder blades, your arms are just holding the resistance. This can be performed seated as well. Repeat 15 Times Hold 2 Seconds Complete 2 Sets Perform 2 Times a Day  ASSESSMENT:  CLINICAL IMPRESSION: The patient presents to OPPT with c/o thoracic pain that has been ongoing for about 6 months.  She will experience intense pain when resuming an upright posture after bending.  She has full UE range of motion and strength.  Her bilateral UE DTR's are normal.  She has pain complaints between her shoulder blades and lower thoracic region with a CC today of radiation into her ribs.  Patient will benefit from skilled physical therapy intervention to address pain.  OBJECTIVE IMPAIRMENTS: decreased activity tolerance and pain.   ACTIVITY LIMITATIONS: carrying, lifting, bending, and standing  PARTICIPATION LIMITATIONS: cleaning and laundry  PERSONAL FACTORS: Time since onset of injury/illness/exacerbation are also affecting patient's functional outcome.   REHAB POTENTIAL: Good  CLINICAL DECISION MAKING: Stable/uncomplicated  EVALUATION COMPLEXITY: Low   GOALS:  LONG TERM GOALS: Target date: 01/21/23  Ind with a HEP.  Goal status: INITIAL  2.  Bend and resume upright posture with pain not > 2-3/10.  Goal status: INITIAL  3.   Perform ADL's with pain not > 2-3/10.  Goal status: INITIAL   PLAN:  PT FREQUENCY: 2x/week  PT DURATION: 6 weeks  PLANNED INTERVENTIONS: 97110-Therapeutic exercises, 97530- Therapeutic activity, [id number]- Neuromuscular re-education, 97535- Self Care, 02725- Manual therapy, 97014- Electrical stimulation (unattended), 97035- Ultrasound, Patient/Family education, Cryotherapy, and Moist heat.  PLAN FOR NEXT SESSION: Modalities and STW/M as needed.  Corner stretch, scapular retraction exercises.   Jissell Trafton, [place], PT 12/10/2022, 12:01 PM

## 2022-12-12 ENCOUNTER — Encounter: Payer: Medicare HMO | Admitting: *Deleted

## 2022-12-24 ENCOUNTER — Other Ambulatory Visit: Payer: Self-pay | Admitting: Family Medicine

## 2022-12-24 DIAGNOSIS — E1140 Type 2 diabetes mellitus with diabetic neuropathy, unspecified: Secondary | ICD-10-CM

## 2023-01-04 ENCOUNTER — Other Ambulatory Visit: Payer: Self-pay | Admitting: Family

## 2023-01-04 ENCOUNTER — Other Ambulatory Visit: Payer: Self-pay | Admitting: Family Medicine

## 2023-01-04 DIAGNOSIS — G2581 Restless legs syndrome: Secondary | ICD-10-CM

## 2023-01-04 DIAGNOSIS — J208 Acute bronchitis due to other specified organisms: Secondary | ICD-10-CM

## 2023-01-04 DIAGNOSIS — F5101 Primary insomnia: Secondary | ICD-10-CM

## 2023-01-06 ENCOUNTER — Encounter: Payer: Self-pay | Admitting: Family Medicine

## 2023-01-06 ENCOUNTER — Ambulatory Visit (INDEPENDENT_AMBULATORY_CARE_PROVIDER_SITE_OTHER): Payer: Medicare HMO | Admitting: Family Medicine

## 2023-01-06 ENCOUNTER — Telehealth: Payer: Self-pay | Admitting: Pharmacist

## 2023-01-06 ENCOUNTER — Other Ambulatory Visit (INDEPENDENT_AMBULATORY_CARE_PROVIDER_SITE_OTHER): Payer: Medicare HMO | Admitting: Pharmacist

## 2023-01-06 VITALS — BP 139/83 | HR 80 | Temp 98.6°F | Ht 65.0 in | Wt 157.0 lb

## 2023-01-06 DIAGNOSIS — Z7985 Long-term (current) use of injectable non-insulin antidiabetic drugs: Secondary | ICD-10-CM

## 2023-01-06 DIAGNOSIS — Z79899 Other long term (current) drug therapy: Secondary | ICD-10-CM

## 2023-01-06 DIAGNOSIS — E1169 Type 2 diabetes mellitus with other specified complication: Secondary | ICD-10-CM

## 2023-01-06 DIAGNOSIS — I152 Hypertension secondary to endocrine disorders: Secondary | ICD-10-CM | POA: Diagnosis not present

## 2023-01-06 DIAGNOSIS — F5101 Primary insomnia: Secondary | ICD-10-CM

## 2023-01-06 DIAGNOSIS — E1142 Type 2 diabetes mellitus with diabetic polyneuropathy: Secondary | ICD-10-CM | POA: Insufficient documentation

## 2023-01-06 DIAGNOSIS — E785 Hyperlipidemia, unspecified: Secondary | ICD-10-CM

## 2023-01-06 DIAGNOSIS — E1159 Type 2 diabetes mellitus with other circulatory complications: Secondary | ICD-10-CM | POA: Diagnosis not present

## 2023-01-06 DIAGNOSIS — E119 Type 2 diabetes mellitus without complications: Secondary | ICD-10-CM

## 2023-01-06 DIAGNOSIS — Z7984 Long term (current) use of oral hypoglycemic drugs: Secondary | ICD-10-CM | POA: Diagnosis not present

## 2023-01-06 LAB — BAYER DCA HB A1C WAIVED: HB A1C (BAYER DCA - WAIVED): 6.7 % — ABNORMAL HIGH (ref 4.8–5.6)

## 2023-01-06 MED ORDER — SEMAGLUTIDE (1 MG/DOSE) 4 MG/3ML ~~LOC~~ SOPN: 1.0000 mg | PEN_INJECTOR | SUBCUTANEOUS | 3 refills | Status: AC

## 2023-01-06 MED ORDER — ZOLPIDEM TARTRATE 5 MG PO TABS: ORAL_TABLET | ORAL | 5 refills | Status: DC

## 2023-01-06 NOTE — Progress Notes (Signed)
01/06/2023 Name: Candace Lopez MRN: [id number] DOB: Nov 06, 1950  Chief Complaint  Patient presents with   Diabetes    Candace Lopez is a 72 y.o. year old female who presented for a telephone visit.  I connected with  Candace Lopez on 01/06/23 by telephone and verified that I am speaking with the correct person using two identifiers. I discussed the limitations of evaluation and management by telemedicine. The patient expressed understanding and agreed to proceed.  Patient was located in her home and PharmD in PCP office during this visit.   They were referred to the pharmacist by their PCP for assistance in managing diabetes and medication access.  Patient had successful visit with PCP today and A1c is now at goal.  PCP & patient would like to increase Ozempic to 1mg  weekly for additional positive outcomes.  We will also aim to re-enroll patient in the novo nordisk patient assistance program for 2025.  Subjective:  Care Team: Primary Care Provider: [name], DO; pt had appt today  Medication Access/Adherence  Current Pharmacy:  [facility] [phone] - [place],  - [address] [place] [address] Phone: [phone] Fax: [phone]  [facility] - [place], [place] - [address] [place] [address] Phone: [phone] Fax: [phone]  Patient reports affordability concerns with their medications: Yes  Patient reports access/transportation concerns to their pharmacy: No  Patient reports adherence concerns with their medications:  No    Diabetes:  Current medications: Ozempic, metformin, glipizide (hoping to d/c) Medications tried in the past: linagliptin, sitagliptin, saxagliptin  Current glucose readings: 100-130s, a few readings ~160-170 when she had had a carb heavy meal the night before Using one touch ultra meter; testing 1 time daily   Patient denies hypoglycemic s/sx including dizziness, shakiness,  sweating. Patient denies hyperglycemic symptoms including polyuria, polydipsia, polyphagia, nocturia, neuropathy, blurred vision.   Current physical activity: encouraged   Current medication access support: enrolled in novo nordisk PAP until 01/26/2023 (med ships to office)  Objective:  Lab Results  Component Value Date   HGBA1C 6.7 (H) 01/06/2023    Lab Results  Component Value Date   CREATININE 0.90 07/07/2022   BUN 15 07/07/2022   NA 138 07/07/2022   K 4.8 07/07/2022   CL 105 07/07/2022   CO2 21 07/07/2022    Lab Results  Component Value Date   CHOL 131 04/04/2022   HDL 30 (L) 04/04/2022   LDLCALC 67 04/04/2022   LDLDIRECT 67 12/13/2019   TRIG 202 (H) 04/04/2022   CHOLHDL 4.4 04/04/2022    Medications Reviewed Today     Reviewed by Danella Maiers, [facility] (Pharmacist) on 01/06/23 at 1540  Med List Status: <None>   Medication Order Taking? Sig Documenting Provider Last Dose Status Informant  alendronate (FOSAMAX) 70 MG tablet [id number] No Take 1 tablet (70 mg total) by mouth every 7 (seven) days. Take with a full glass of water on an empty stomach. Delynn Flavin M, DO Taking Active   Alpha-Lipoic Acid 600 MG CAPS [id number] No Take 1 capsule (600 mg total) by mouth daily. For diabetic neuropathy [name], DO Taking Active            Med Note Abbe Amsterdam, AMY E   Mon Sep 09, 2021  1:38 PM) Taking every other day  Alum & Mag Hydroxide-Simeth (GI COCKTAIL) SUSP suspension [id number] No Take 30 mLs by mouth 2 (two)  times daily. Shake well. Junie Spencer, FNP Taking Active   aspirin EC 81 MG tablet [id number] No Take 81 mg by mouth in the morning and at bedtime. Swallow whole. [provider] Taking Active   atorvastatin (LIPITOR) 40 MG tablet [id number] No TAKE 1 TABLET BY MOUTH EVERYDAY AT BEDTIME Gottschalk, Ashly M, DO Taking Active   benzonatate (TESSALON) 200 MG capsule [id number] No Take 1 capsule (200 mg total) by mouth 3 (three) times daily as  needed. Junie Spencer, FNP Taking Active   Blood Glucose Monitoring Suppl (ONE TOUCH ULTRA 2) w/Device KIT [id number] No UAD to check BGs E11.9 Delynn Flavin M, DO Taking Active   glipiZIDE (GLUCOTROL XL) 5 MG 24 hr tablet [id number] No TAKE 1 TABLET (5 MG TOTAL) BY MOUTH DAILY WITH BREAKFAST. FOR FASTING SUGAR >200. Delynn Flavin M, DO Taking Active   ibuprofen (ADVIL) 800 MG tablet [id number] No Take 800 mg by mouth every 8 (eight) hours as needed for moderate pain. [provider] Taking Active   lisinopril (ZESTRIL) 10 MG tablet [id number] No TAKE 1 TABLET (10 MG TOTAL) BY MOUTH IN THE MORNING AND AT BEDTIME. (STOP TAKING LISINOPRIL 20MG ) Delynn Flavin M, DO Taking Active   metFORMIN (GLUCOPHAGE-XR) 500 MG 24 hr tablet [id number] No TAKE 2 TABLETS BY MOUTH EVERY DAY WITH BREAKFAST Delynn Flavin M, DO Taking Active   metoprolol tartrate (LOPRESSOR) 25 MG tablet [id number] No TAKE 1 TABLET BY MOUTH TWICE A DAY Gottschalk, Ashly M, DO Taking Active   ondansetron (ZOFRAN) 4 MG tablet [id number] No Take 1 tablet (4 mg total) by mouth every 8 (eight) hours as needed for nausea or vomiting. Junie Spencer, FNP Taking Active   OneTouch Delica Lancets 30G MISC [id number] No Check BS BID Dx E11.40 [name], DO Taking Active   [facility] ULTRA test strip [id number] No CHECK BLOOD SUGAR TWICE DAILY DX 11.40 Delynn Flavin M, DO Taking Active   pramipexole (MIRAPEX) 0.25 MG tablet [id number] No TAKE 1 TABLET 2 HOURS BEFORE BEDTIME FOR RESTLESS LEGS [name], DO Taking Active   Semaglutide, 1 MG/DOSE, 4 MG/3ML SOPN [id number]  Inject 1 mg as directed every 7 (seven) days. [name], DO  Active   TURMERIC PO [id number] No Take 1 capsule by mouth 2 (two) times daily.  [provider] Taking Active Self  zolpidem (AMBIEN) 5 MG tablet [id number]  take 1 tablet(5 mg total) by mouth at bedtime as needed for sleep. Delynn Flavin M, DO  Active              Assessment/Plan:   Diabetes: - Currently controlled--pt had appt today; increase Ozempic to 1mg  weekly, continue metformin; d/c glipizide  Will submit dose change form for Ozempic 1mg  weekly via novo nordisk PAP  Re-enroll in novo nordisk PAP  No reported nausea--may have some with increased doses - Reviewed long term cardiovascular and renal outcomes of uncontrolled blood sugar - Reviewed goal A1c, goal fasting, and goal 2 hour post prandial glucose - Patient denies personal or family history of multiple endocrine neoplasia type 2, medullary thyroid cancer; personal history of pancreatitis or gallbladder disease. - Recommend to check glucose daily (fasting) or if symptomatic - Reviewed dietary modifications including healthy plate method - Reviewed lifestyle modifications including: increased exercise, water -Continue all other medications as prescribed. - Recommend to check glucose daily (fasting) or if symptomatic -Reivewed fill history; patient appears/reports compliance; LDL at goal    Follow Up Plan: 2-3 months  30 min of patient care was provided to the patient during this visit time    [name], PharmD, BCACP, CPP Clinical Pharmacist, [facility]

## 2023-01-06 NOTE — Telephone Encounter (Signed)
Increase to Ozempic 1mg   Needs re-enrollment in PAP Okay with electronic PAP Send PCP to me if needed Thanks!

## 2023-01-06 NOTE — Progress Notes (Signed)
Subjective: CC:DM PCP: [name], DO FAO:ZHYQMV H Vitatoe is a 72 y.o. female presenting to clinic today for:  1. Type 2 Diabetes with hypertension, hyperlipidemia:  Reports compliance with medications.  She is actually been taking the glipizide extended release 5 mg daily rather than as needed blood sugars over 200.  She is compliant with her weekly injectable.  No hypoglycemic episodes reported.  Recently treated for a pulmonary infection with steroid.  She has been seeing sugars around 160s to 170s pretty consistently since then.  Would like to go up on her injectable  Diabetes Health Maintenance Due  Topic Date Due   HEMOGLOBIN A1C  07/07/2023   OPHTHALMOLOGY EXAM  09/03/2023   FOOT EXAM  01/06/2024    Last A1c:  Lab Results  Component Value Date   HGBA1C 6.4 (H) 07/07/2022    ROS: No new numbness, tingling, foot ulcers.  No shortness of breath.  She had occasional chest pains and was seen a couple of weeks ago and found to have pneumonia.  That has since resolved  2.  Insomnia She reports this is chronic and stable.  Needs refills.  No excessive daytime sedation, sleepwalking etc.  ROS: Per HPI  Allergies  Allergen Reactions   Tape Rash   Past Medical History:  Diagnosis Date   DDD (degenerative disc disease), lumbar    Diabetes mellitus without complication (HCC)    GERD (gastroesophageal reflux disease)    Hyperlipidemia    Hypertension    Urge incontinence of urine     Current Outpatient Medications:    alendronate (FOSAMAX) 70 MG tablet, Take 1 tablet (70 mg total) by mouth every 7 (seven) days. Take with a full glass of water on an empty stomach., Disp: 12 tablet, Rfl: 3   Alpha-Lipoic Acid 600 MG CAPS, Take 1 capsule (600 mg total) by mouth daily. For diabetic neuropathy, Disp: 90 capsule, Rfl: 3   Alum & Mag Hydroxide-Simeth (GI COCKTAIL) SUSP suspension, Take 30 mLs by mouth 2 (two) times daily. Shake well., Disp: 200 mL, Rfl: 1   aspirin EC 81  MG tablet, Take 81 mg by mouth in the morning and at bedtime. Swallow whole., Disp: , Rfl:    atorvastatin (LIPITOR) 40 MG tablet, TAKE 1 TABLET BY MOUTH EVERYDAY AT BEDTIME, Disp: 90 tablet, Rfl: 1   benzonatate (TESSALON) 200 MG capsule, Take 1 capsule (200 mg total) by mouth 3 (three) times daily as needed., Disp: 60 capsule, Rfl: 1   Blood Glucose Monitoring Suppl (ONE TOUCH ULTRA 2) w/Device KIT, UAD to check BGs E11.9, Disp: 1 kit, Rfl: 0   glipiZIDE (GLUCOTROL XL) 5 MG 24 hr tablet, TAKE 1 TABLET (5 MG TOTAL) BY MOUTH DAILY WITH BREAKFAST. FOR FASTING SUGAR >200., Disp: 90 tablet, Rfl: 0   ibuprofen (ADVIL) 800 MG tablet, Take 800 mg by mouth every 8 (eight) hours as needed for moderate pain., Disp: , Rfl:    lisinopril (ZESTRIL) 10 MG tablet, TAKE 1 TABLET (10 MG TOTAL) BY MOUTH IN THE MORNING AND AT BEDTIME. (STOP TAKING LISINOPRIL 20MG ), Disp: 180 tablet, Rfl: 1   metFORMIN (GLUCOPHAGE-XR) 500 MG 24 hr tablet, TAKE 2 TABLETS BY MOUTH EVERY DAY WITH BREAKFAST, Disp: 180 tablet, Rfl: 0   metoprolol tartrate (LOPRESSOR) 25 MG tablet, TAKE 1 TABLET BY MOUTH TWICE A DAY, Disp: 180 tablet, Rfl: 1   ondansetron (ZOFRAN) 4 MG tablet, Take 1 tablet (4 mg total) by mouth every 8 (eight) hours as needed for  nausea or vomiting., Disp: 20 tablet, Rfl: 0   OneTouch Delica Lancets 30G MISC, Check BS BID Dx E11.40, Disp: 200 each, Rfl: 3   ONETOUCH ULTRA test strip, CHECK BLOOD SUGAR TWICE DAILY DX 11.40, Disp: 200 strip, Rfl: 4   pramipexole (MIRAPEX) 0.25 MG tablet, TAKE 1 TABLET 2 HOURS BEFORE BEDTIME FOR RESTLESS LEGS, Disp: 90 tablet, Rfl: 0   Semaglutide, 1 MG/DOSE, 4 MG/3ML SOPN, Inject 1 mg as directed every 7 (seven) days., Disp: 9 mL, Rfl: 3   TURMERIC PO, Take 1 capsule by mouth 2 (two) times daily. , Disp: , Rfl:    zolpidem (AMBIEN) 5 MG tablet, take 1 tablet(5 mg total) by mouth at bedtime as needed for sleep., Disp: 30 tablet, Rfl: 5 Social History   Socioeconomic History   Marital  status: Married    Spouse name: Jimmy   Number of children: 2   Years of education: Not on file   Highest education level: Not on file  Occupational History   Occupation: retired  Tobacco Use   Smoking status: Never   Smokeless tobacco: Never  Vaping Use   Vaping status: Never Used  Substance and Sexual Activity   Alcohol use: No   Drug use: No   Sexual activity: Not on file  Other Topics Concern   Not on file  Social History Narrative   2 children  - son lives in [place], daughter 15 minutes away.   09/09/21 - She is having to stay home more to take care of husband who is not in good health   Social Determinants of Health   Financial Resource Strain: Low Risk  (05/21/2022)   Overall Financial Resource Strain (CARDIA)    Difficulty of Paying Living Expenses: Not hard at all  Food Insecurity: No Food Insecurity (05/21/2022)   Hunger Vital Sign    Worried About Running Out of Food in the Last Year: Never true    Ran Out of Food in the Last Year: Never true  Transportation Needs: No Transportation Needs (05/21/2022)   PRAPARE - Administrator, Civil Service (Medical): No    Lack of Transportation (Non-Medical): No  Physical Activity: Insufficiently Active (05/21/2022)   Exercise Vital Sign    Days of Exercise per Week: 3 days    Minutes of Exercise per Session: 30 min  Stress: No Stress Concern Present (05/21/2022)   [employer] of Occupational Health - Occupational Stress Questionnaire    Feeling of Stress : Not at all  Social Connections: Moderately Integrated (12/03/2022)   Social Connection and Isolation Panel [NHANES]    Frequency of Communication with Friends and Family: More than three times a week    Frequency of Social Gatherings with Friends and Family: More than three times a week    Attends Religious Services: More than 4 times per year    Active Member of [employer] or Organizations: No    Attends Banker Meetings: Never    Marital  Status: Married  Catering manager Violence: Not At Risk (05/21/2022)   Humiliation, Afraid, Rape, and Kick questionnaire    Fear of Current or Ex-Partner: No    Emotionally Abused: No    Physically Abused: No    Sexually Abused: No   Family History  Problem Relation Age of Onset   Cancer Mother        lung cancer   Cancer Father        lymphnodes   Diabetes Sister  Lung cancer Sister    Diabetes Brother    Cancer Maternal Grandfather    Heart disease Paternal Grandfather    Diabetes Son    Cancer Maternal Aunt        throat   Cancer Paternal Uncle        unknown    Cancer Other        breast   Colon cancer Neg Hx     Objective: Office vital signs reviewed. BP 139/83   Pulse 80   Temp 98.6 F (37 C)   Ht 5\' 5"  (1.651 m)   Wt 157 lb (71.2 kg)   SpO2 97%   BMI 26.13 kg/m   Physical Examination:  General: Awake, alert, well nourished, No acute distress HEENT: sclera white, MMM Cardio: regular rate and rhythm, S1S2 heard, no murmurs appreciated Pulm: clear to auscultation bilaterally, no wheezes, rhonchi or rales; normal work of breathing on room air  Diabetic Foot Exam - Simple   Simple Foot Form Diabetic Foot exam was performed with the following findings: Yes 01/06/2023  9:10 AM  Visual Inspection No deformities, no ulcerations, no other skin breakdown bilaterally: Yes Sensation Testing Intact to touch and monofilament testing bilaterally: Yes Pulse Check Posterior Tibialis and Dorsalis pulse intact bilaterally: Yes Comments     Assessment/ Plan: 72 y.o. female   Diabetes mellitus treated with injections of non-insulin medication (HCC) - Plan: Bayer DCA Hb A1c Waived, CMP14+EGFR, Microalbumin / creatinine urine ratio, Semaglutide, 1 MG/DOSE, 4 MG/3ML SOPN  Diabetic polyneuropathy associated with type 2 diabetes mellitus (HCC) - Plan: CMP14+EGFR  Hypertension associated with diabetes (HCC) - Plan: CMP14+EGFR  Hyperlipidemia associated with type 2  diabetes mellitus (HCC) - Plan: CMP14+EGFR  Primary insomnia - Plan: ToxASSURE Select 13 (MW), Urine, zolpidem (AMBIEN) 5 MG tablet  Controlled substance agreement signed - Plan: ToxASSURE Select 13 (MW), Urine  Sugar technically controlled but rising at 6.7 today.  Have advanced her Ozempic.  Discussed holding glipizide unless blood sugars above 200.  Check urine microalbumin.  Check renal function, liver enzymes  Continue sugar control to reduce progression of neuropathy.  Blood pressure controlled.  No changes needed.  Continue current dose of cholesterol medication.  Not yet due for fasting lipid  Insomnia chronic and stable.  UDS and CSA were updated as per office policy.  The national narcotic database reviewed and there were no red flags  [name], DO [facility] [phone]

## 2023-01-08 ENCOUNTER — Encounter: Payer: Self-pay | Admitting: Pharmacist

## 2023-01-08 ENCOUNTER — Telehealth: Payer: Self-pay | Admitting: Pharmacist

## 2023-01-08 LAB — CMP14+EGFR
ALT: 19 [IU]/L (ref 0–32)
AST: 16 [IU]/L (ref 0–40)
Albumin: 4.4 g/dL (ref 3.8–4.8)
Alkaline Phosphatase: 60 [IU]/L (ref 44–121)
BUN/Creatinine Ratio: 16 (ref 12–28)
BUN: 13 mg/dL (ref 8–27)
Bilirubin Total: 1.2 mg/dL (ref 0.0–1.2)
CO2: 22 mmol/L (ref 20–29)
Calcium: 9.8 mg/dL (ref 8.7–10.3)
Chloride: 106 mmol/L (ref 96–106)
Creatinine, Ser: 0.81 mg/dL (ref 0.57–1.00)
Globulin, Total: 2.3 g/dL (ref 1.5–4.5)
Glucose: 142 mg/dL — ABNORMAL HIGH (ref 70–99)
Potassium: 4.9 mmol/L (ref 3.5–5.2)
Sodium: 141 mmol/L (ref 134–144)
Total Protein: 6.7 g/dL (ref 6.0–8.5)
eGFR: 78 mL/min/{1.73_m2} (ref >59)

## 2023-01-08 LAB — MICROALBUMIN / CREATININE URINE RATIO
Creatinine, Urine: 254.4 mg/dL
Microalb/Creat Ratio: 15 mg/g{creat} (ref 0–29)
Microalbumin, Urine: 37.4 ug/mL

## 2023-01-08 NOTE — Telephone Encounter (Signed)
   Patient enrolled in the novo nordisk patient assistance program for Ozempic.  Patient to increase to 1mg  weekly.  #2 boxes of Ozempic 0.5mg  arrived to PCP office today.  Instructed patient to give 0.5mg  x2 weekly to equal 1mg  while awaiting new dose.   [name], PharmD, BCACP, CPP Clinical Pharmacist, [facility]

## 2023-01-09 LAB — TOXASSURE SELECT 13 (MW), URINE

## 2023-01-19 ENCOUNTER — Telehealth: Payer: Self-pay

## 2023-01-19 NOTE — Progress Notes (Addendum)
 Pharmacy Medication Assistance Program Note    03/20/2023  Patient ID: Candace Lopez, female   DOB: 1950/04/12, 72 y.o.   MRN: [id number]     01/19/2023  Outreach Medication One  Manufacturer Medication One [employer] Drugs Ozempic  Dose of Ozempic 1mg   Type of Radiographer, therapeutic Assistance  Date Application Submitted to Manufacturer 01/19/2023  Method Application Sent to Manufacturer Online  Patient Assistance Determination Approved  Approval End Date 01/27/2024

## 2023-01-20 NOTE — Telephone Encounter (Signed)
PAP novo ozempic 1mg  esigned PCP portion

## 2023-02-04 ENCOUNTER — Other Ambulatory Visit: Payer: Self-pay | Admitting: *Deleted

## 2023-02-04 ENCOUNTER — Other Ambulatory Visit: Payer: Medicare HMO

## 2023-02-04 NOTE — Patient Outreach (Signed)
 Care Management   Visit Note  02/04/2023 Name: FELICIA BLOOMQUIST MRN: [id number] DOB: [birth year]  Subjective: Candace Lopez is a 73 y.o. year old female who is a primary care patient of Jolinda Norene HERO, DO. The Care Management team was consulted for assistance.      Engaged with patient spoke with patient by telephone.    Goals Addressed             This Visit's Progress    COMPLETED: CCM (DIABETES) EXPECTED OUTCOME: MONITOR, SELF- MANAGE AND REDUCE SYMPTOMS OF DIABETES       Current Barriers:  Knowledge Deficits related to Diabetes management Care Coordination needs related to pharmacy needs, medication assistance in a patient with Diabetes Chronic Disease Management support and education needs related to Diabetes No Advanced Directives in place   Planned Interventions: Lab Results  Component Value Date   HGBA1C 6.4 (H) 07/07/2022   HGBA1C 7.7 (H) 04/04/2022   HGBA1C 8.5 (H) 12/30/2021     Provided education to patient about basic DM disease process; Reviewed medications with patient and discussed importance of medication adherence;        Counseled on importance of regular laboratory monitoring as prescribed;        Discussed plans with patient for ongoing care management follow up and provided patient with direct contact information for care management team;      Provided patient with written educational materials related to hypo and hyperglycemia and importance of correct treatment;       Advised patient, providing education and rationale, to check cbg as recommended by pcp  and record        call provider for findings outside established parameters;       Referral made to pharmacy team for assistance with Ozempic ; Pharm D 01-06-2024 at 1pm for 2025 pap Review of patient status, including review of consultants reports, relevant laboratory and other test results, and medications completed;       Screening for signs and symptoms of depression related to chronic disease  state;        Assessed social determinant of health barriers;        Reviewed carbohydrate modified diet/ plate method Recently diagnosed with acute bronchitis, taking prescribed medications as ordered. States she is feeling better & states her drainage is started to clear. Her PT has been limited recently due to her breathing but she is hopeful to get started again with PT.   Symptom Management: Take medications as prescribed   Attend all scheduled provider appointments Call pharmacy for medication refills 3-7 days in advance of running out of medications Attend church or other social activities Perform all self care activities independently  Perform IADL's (shopping, preparing meals, housekeeping, managing finances) independently Call provider office for new concerns or questions  check blood sugar at prescribed times: once daily check feet daily for cuts, sores or redness enter blood sugar readings and medication or insulin into daily log take the blood sugar log to all doctor visits take the blood sugar meter to all doctor visits trim toenails straight across fill half of plate with vegetables limit fast food meals to no more than 1 per week manage portion size prepare main meal at home 3 to 5 days each week read food labels for fat, fiber, carbohydrates and portion size set a realistic goal wash and dry feet carefully every day wear comfortable, cotton socks  Follow Up Plan: Telephone follow up appointment with care management team member scheduled  for:  02-04-2023 at 9:00 am       RNCM Care Management Expected: Monitor, Self-Manage & Reduce Symptoms of: DM       Current Barriers:  Knowledge Deficits related to plan of care for management of DMII  Chronic Disease Management support and education needs related to DMII   RNCM Clinical Goal(s):  Patient will verbalize basic understanding of  DMII disease process and self health management plan as evidenced by verbal explanation,  recognizing symptoms, lifestyle modification take all medications exactly as prescribed and will call provider for medication related questions as evidenced by compliance with all medications attend all scheduled medical appointments: with primary care provider and specialist as evidenced by keeping all scheduled appointments demonstrate Improved and Ongoing adherence to prescribed treatment plan for DMII as evidenced by consistent medication compliance, symptom monitoring, continued lifestyle modifications continue to work with RN Care Manager to address care management and care coordination needs related to  DMII as evidenced by adherence to CM Team Scheduled appointments through collaboration with RN Care manager, provider, and care team.   Interventions: Evaluation of current treatment plan related to  self management and patient's adherence to plan as established by provider   Diabetes Interventions:  (Status:  Goal on track:  Yes.) Long Term Goal Assessed patient's understanding of A1c goal: <7% Provided education to patient about basic DM disease process. Patient verbalizes good understanding of her disease process. RNCM reviewed with patient the fasting goal <130 and post prandial <180.  Reviewed medications with patient and discussed importance of medication adherence. Reports compliance with all medications. Patient states since her Ozempic  was increased to 1 mg, she has noticed a decrease in appetite and approximately 5-6 lb weight loss. She reports once episode of emesis on 01-29-2023 after eating fried fish from [employer]. She contributes the emesis to the fish being greasy. Counseled on importance of regular laboratory monitoring as prescribed Discussed plans with patient for ongoing care management follow up and provided patient with direct contact information for care management team Provided patient with written educational materials related to hypo and hyperglycemia and importance of  correct treatment. No reports of hypo/hyperglycemia. Reviewed scheduled/upcoming provider appointments including: 04-14-2023 with PCP Advised patient, providing education and rationale, to check cbg once daily, fasting and record, calling provider for findings outside established parameters. Reports fasting glucose this morning of 122. Lowest: 111 Highest: 122.  Review of patient status, including review of consultants reports, relevant laboratory and other test results, and medications completed Screening for signs and symptoms of depression related to chronic disease state  Assessed social determinant of health barriers Lab Results  Component Value Date   HGBA1C 6.7 (H) 01/06/2023    Patient Goals/Self-Care Activities: Take all medications as prescribed Attend all scheduled provider appointments Call pharmacy for medication refills 3-7 days in advance of running out of medications Attend church or other social activities Perform all self care activities independently  Perform IADL's (shopping, preparing meals, housekeeping, managing finances) independently Call provider office for new concerns or questions  schedule appointment with eye doctor check blood sugar at prescribed times: once daily check feet daily for cuts, sores or redness enter blood sugar readings and medication or insulin into daily log  Follow Up Plan:  Telephone follow up appointment with care management team member scheduled for:  03-30-2023 at 9:00 am              Consent to Services:  Patient was given information about care management services, agreed  to services, and gave verbal consent to participate.   Plan: Telephone follow up appointment with care management team member scheduled for:03-30-2023 at 9:00 am  Rosina Forte, BSN RN RN Care Manager  [facility]  Ambulatory Care Management  Direct Number: [phone]

## 2023-02-04 NOTE — Patient Instructions (Signed)
 Visit Information  Thank you for taking time to visit with me today. Please don't hesitate to contact me if I can be of assistance to you before our next scheduled telephone appointment.  Following are the goals we discussed today:   Goals Addressed             This Visit's Progress    COMPLETED: CCM (DIABETES) EXPECTED OUTCOME: MONITOR, SELF- MANAGE AND REDUCE SYMPTOMS OF DIABETES       Current Barriers:  Knowledge Deficits related to Diabetes management Care Coordination needs related to pharmacy needs, medication assistance in a patient with Diabetes Chronic Disease Management support and education needs related to Diabetes No Advanced Directives in place   Planned Interventions: Lab Results  Component Value Date   HGBA1C 6.4 (H) 07/07/2022   HGBA1C 7.7 (H) 04/04/2022   HGBA1C 8.5 (H) 12/30/2021     Provided education to patient about basic DM disease process; Reviewed medications with patient and discussed importance of medication adherence;        Counseled on importance of regular laboratory monitoring as prescribed;        Discussed plans with patient for ongoing care management follow up and provided patient with direct contact information for care management team;      Provided patient with written educational materials related to hypo and hyperglycemia and importance of correct treatment;       Advised patient, providing education and rationale, to check cbg as recommended by pcp  and record        call provider for findings outside established parameters;       Referral made to pharmacy team for assistance with Ozempic ; Pharm D 01-06-2024 at 1pm for 2025 pap Review of patient status, including review of consultants reports, relevant laboratory and other test results, and medications completed;       Screening for signs and symptoms of depression related to chronic disease state;        Assessed social determinant of health barriers;        Reviewed carbohydrate  modified diet/ plate method Recently diagnosed with acute bronchitis, taking prescribed medications as ordered. States she is feeling better & states her drainage is started to clear. Her PT has been limited recently due to her breathing but she is hopeful to get started again with PT.   Symptom Management: Take medications as prescribed   Attend all scheduled provider appointments Call pharmacy for medication refills 3-7 days in advance of running out of medications Attend church or other social activities Perform all self care activities independently  Perform IADL's (shopping, preparing meals, housekeeping, managing finances) independently Call provider office for new concerns or questions  check blood sugar at prescribed times: once daily check feet daily for cuts, sores or redness enter blood sugar readings and medication or insulin into daily log take the blood sugar log to all doctor visits take the blood sugar meter to all doctor visits trim toenails straight across fill half of plate with vegetables limit fast food meals to no more than 1 per week manage portion size prepare main meal at home 3 to 5 days each week read food labels for fat, fiber, carbohydrates and portion size set a realistic goal wash and dry feet carefully every day wear comfortable, cotton socks  Follow Up Plan: Telephone follow up appointment with care management team member scheduled for:  02-04-2023 at 9:00 am       RNCM Care Management Expected: Monitor, Self-Manage & Reduce  Symptoms of: DM       Current Barriers:  Knowledge Deficits related to plan of care for management of DMII  Chronic Disease Management support and education needs related to DMII   RNCM Clinical Goal(s):  Patient will verbalize basic understanding of  DMII disease process and self health management plan as evidenced by verbal explanation, recognizing symptoms, lifestyle modification take all medications exactly as prescribed and  will call provider for medication related questions as evidenced by compliance with all medications attend all scheduled medical appointments: with primary care provider and specialist as evidenced by keeping all scheduled appointments demonstrate Improved and Ongoing adherence to prescribed treatment plan for DMII as evidenced by consistent medication compliance, symptom monitoring, continued lifestyle modifications continue to work with RN Care Manager to address care management and care coordination needs related to  DMII as evidenced by adherence to CM Team Scheduled appointments through collaboration with RN Care manager, provider, and care team.   Interventions: Evaluation of current treatment plan related to  self management and patient's adherence to plan as established by provider   Diabetes Interventions:  (Status:  Goal on track:  Yes.) Long Term Goal Assessed patient's understanding of A1c goal: <7% Provided education to patient about basic DM disease process. Patient verbalizes good understanding of her disease process. RNCM reviewed with patient the fasting goal <130 and post prandial <180.  Reviewed medications with patient and discussed importance of medication adherence. Reports compliance with all medications. Patient states since her Ozempic  was increased to 1 mg, she has noticed a decrease in appetite and approximately 5-6 lb weight loss. She reports once episode of emesis on 01-29-2023 after eating fried fish from [employer]. She contributes the emesis to the fish being greasy. Counseled on importance of regular laboratory monitoring as prescribed Discussed plans with patient for ongoing care management follow up and provided patient with direct contact information for care management team Provided patient with written educational materials related to hypo and hyperglycemia and importance of correct treatment. No reports of hypo/hyperglycemia. Reviewed scheduled/upcoming provider  appointments including: 04-14-2023 with PCP Advised patient, providing education and rationale, to check cbg once daily, fasting and record, calling provider for findings outside established parameters. Reports fasting glucose this morning of 122. Lowest: 111 Highest: 122.  Review of patient status, including review of consultants reports, relevant laboratory and other test results, and medications completed Screening for signs and symptoms of depression related to chronic disease state  Assessed social determinant of health barriers Lab Results  Component Value Date   HGBA1C 6.7 (H) 01/06/2023    Patient Goals/Self-Care Activities: Take all medications as prescribed Attend all scheduled provider appointments Call pharmacy for medication refills 3-7 days in advance of running out of medications Attend church or other social activities Perform all self care activities independently  Perform IADL's (shopping, preparing meals, housekeeping, managing finances) independently Call provider office for new concerns or questions  schedule appointment with eye doctor check blood sugar at prescribed times: once daily check feet daily for cuts, sores or redness enter blood sugar readings and medication or insulin into daily log  Follow Up Plan:  Telephone follow up appointment with care management team member scheduled for:  03-30-2023 at 9:00 am           Our next appointment is by telephone on 03-30-2023 at 9:00 am  Please call the care guide team at [phone] if you need to cancel or reschedule your appointment.   If you are experiencing a  Mental Health or Behavioral Health Crisis or need someone to talk to, please call the Suicide and Crisis Lifeline: 988 call the [place]  National Suicide Prevention Lifeline: 347-465-8864 or TTY: 848 454 7486 TTY 651-275-5430) to talk to a trained counselor call 1-800-273-TALK (toll free, 24 hour hotline) call 911   Patient verbalizes understanding of  instructions and care plan provided today and agrees to view in MyChart. Active MyChart status and patient understanding of how to access instructions and care plan via MyChart confirmed with patient.     Telephone follow up appointment with care management team member scheduled for:03-30-2023 at 9:00 am  Rosina Forte, BSN RN RN Care Manager  [facility]  Ambulatory Care Management  Direct Number: [phone]

## 2023-02-18 ENCOUNTER — Ambulatory Visit: Payer: Self-pay | Admitting: Family Medicine

## 2023-02-18 ENCOUNTER — Ambulatory Visit (INDEPENDENT_AMBULATORY_CARE_PROVIDER_SITE_OTHER): Payer: PPO | Admitting: Nurse Practitioner

## 2023-02-18 ENCOUNTER — Encounter: Payer: Self-pay | Admitting: Nurse Practitioner

## 2023-02-18 VITALS — BP 155/84 | HR 91 | Temp 98.0°F | Ht 65.0 in | Wt 154.8 lb

## 2023-02-18 DIAGNOSIS — M79605 Pain in left leg: Secondary | ICD-10-CM | POA: Diagnosis not present

## 2023-02-18 MED ORDER — METHYLPREDNISOLONE ACETATE 40 MG/ML IJ SUSP
40.0000 mg | Freq: Once | INTRAMUSCULAR | Status: AC
  Administered 2023-02-18: 40 mg via INTRAMUSCULAR

## 2023-02-18 NOTE — Telephone Encounter (Addendum)
  Chief Complaint: leg pain Symptoms: leg pain radiating to back Frequency: for months Pertinent Negatives: Patient denies leg swelling, redness, numbness, injuries Disposition: [] ED /[] Urgent Care (no appt availability in office) / [x] Appointment(In office/virtual)/ []  [facility]/ [] Home Care/ [] Refused Recommended Disposition /[] [facility]/ []  Follow-up with PCP Additional Notes: Patient reports L leg pain radiating to back. Denies numbness, swelling, and is able to bare weight. OTC medications with minimal relief. Scheduled patient per protocol on 02/18/2023. Patient verbalized understanding and to call back with worsening symptoms.    Reason for Disposition  [1] SEVERE pain (e.g., excruciating, unable to do any normal activities) AND [2] not improved after 2 hours of pain medicine  Answer Assessment - Initial Assessment Questions 1. ONSET: "When did the pain start?"      Months ago-- went to the PCP and was referred to "arthritis doctor" that recommended shot but pt declined initially improved without tx, but now worsening 2. LOCATION: "Where is the pain located?"      Left leg 3. PAIN: "How bad is the pain?"    (Scale 1-10; or mild, moderate, severe)   -  MILD (1-3): doesn't interfere with normal activities    -  MODERATE (4-7): interferes with normal activities (e.g., work or school) or awakens from sleep, limping    -  SEVERE (8-10): excruciating pain, unable to do any normal activities, unable to walk     8/10 Wakes me up at night Able to walk Has taken OTC medication with some relief, but pain comes back 4. WORK OR EXERCISE: "Has there been any recent work or exercise that involved this part of the body?"      Nothing but does walk to maintain mobility 5. CAUSE: "What do you think is causing the leg pain?"     arithritis 6. OTHER SYMPTOMS: "Do you have any other symptoms?" (e.g., chest pain, back pain, breathing difficulty, swelling, rash, fever,  numbness, weakness)     Leg pain radiating to back Denies numbness, weakness, swelling  Protocols used: Leg Pain-A-AH

## 2023-02-18 NOTE — Progress Notes (Unsigned)
   Acute Office Visit  Subjective:     Patient ID: Candace Lopez, female    DOB: 1950-10-04, 73 y.o.   MRN: [id number]  Chief Complaint  Patient presents with  . Leg Pain    Left leg pain from knee up to hip for a while but past 3 days has been worse    HPI  Candace Lopez Left left pain The patient is a 73 year old with a history of arthritis who presents with a chief complaint of left knee pain. The patient reports that the pain started approximately 2 days ago. The pain is described as sharp in nature and localized to the left knee. The pain has been persistent, and the patient denies any history of trauma or recent injury to the knee. The pain is aggravated by movement, particularly with walking, and is partially relieved by rest. The patient rates the pain as moderate to severe on a pain scale of 7/10. No swelling, redness, or warmth noted around the joint. The patient also denies any numbness, tingling, or difficulty with weight-bearing. No fever or other systemic symptoms are reported.  ROS Negative unless indicated in HPI    Objective:    BP (!) 155/84   Pulse 91   Temp 98 F (36.7 C) (Temporal)   Ht 5\' 5"  (1.651 m)   Wt 154 lb 12.8 oz (70.2 kg)   SpO2 95%   BMI 25.76 kg/m  BP Readings from Last 3 Encounters:  02/18/23 (!) 155/84  01/06/23 139/83  12/04/22 113/65   Wt Readings from Last 3 Encounters:  02/18/23 154 lb 12.8 oz (70.2 kg)  01/06/23 157 lb (71.2 kg)  12/04/22 156 lb 3.2 oz (70.9 kg)      Physical Exam  No results found for any visits on 02/18/23.      Assessment & Plan:  Anterior leg pain, left -     methylPREDNISolone Acetate    Return if symptoms worsen or fail to improve.  Arrie Aran Santa Lighter, [facility] [address] [place], [place] [address] [phone]  Note: This document was prepared by [employer] voice dictation technology and any errors that results from this process are unintentional.

## 2023-02-19 ENCOUNTER — Telehealth: Payer: Self-pay | Admitting: Pharmacist

## 2023-02-19 ENCOUNTER — Encounter: Payer: Self-pay | Admitting: Pharmacist

## 2023-02-19 NOTE — Telephone Encounter (Signed)
Notified patient that Ozempic 1mg  shipment is available for pick up #4 boxes of Ozempic 1mg  Patient stable on current dose

## 2023-02-23 ENCOUNTER — Encounter: Payer: Self-pay | Admitting: Family Medicine

## 2023-02-23 ENCOUNTER — Ambulatory Visit (INDEPENDENT_AMBULATORY_CARE_PROVIDER_SITE_OTHER): Payer: PPO | Admitting: Family Medicine

## 2023-02-23 VITALS — BP 135/74 | HR 89 | Temp 98.1°F | Ht 65.0 in | Wt 155.0 lb

## 2023-02-23 DIAGNOSIS — B029 Zoster without complications: Secondary | ICD-10-CM | POA: Diagnosis not present

## 2023-02-23 MED ORDER — VALACYCLOVIR HCL 1 G PO TABS: 1000.0000 mg | ORAL_TABLET | Freq: Three times a day (TID) | ORAL | 0 refills | Status: DC

## 2023-02-23 MED ORDER — GABAPENTIN 300 MG PO CAPS: 300.0000 mg | ORAL_CAPSULE | Freq: Three times a day (TID) | ORAL | 3 refills | Status: DC

## 2023-02-23 NOTE — Progress Notes (Signed)
Subjective: CC: rash PCP: [name], DO FAO:ZHYQMV Candace Lopez is a 72 y.o. female presenting to clinic today for:  1. rash Patient reports onset about 8 days ago. She was seen last Wednesday and was told it was a heat rash related to use of a heating blanket.  She was given a corticosteroid injection but she notes that the rash just has gotten more painful and is now affecting her left buttock, her left thigh and the left groin. It feels like when she had a shingles rash on her back years ago. Has not had shingrix.  ROS: Per HPI  Allergies  Allergen Reactions   Tape Rash   Past Medical History:  Diagnosis Date   DDD (degenerative disc disease), lumbar    Diabetes mellitus without complication (HCC)    GERD (gastroesophageal reflux disease)    Hyperlipidemia    Hypertension    Urge incontinence of urine     Current Outpatient Medications:    alendronate (FOSAMAX) 70 MG tablet, Take 1 tablet (70 mg total) by mouth every 7 (seven) days. Take with a full glass of water on an empty stomach., Disp: 12 tablet, Rfl: 3   Alpha-Lipoic Acid 600 MG CAPS, Take 1 capsule (600 mg total) by mouth daily. For diabetic neuropathy, Disp: 90 capsule, Rfl: 3   Alum & Mag Hydroxide-Simeth (GI COCKTAIL) SUSP suspension, Take 30 mLs by mouth 2 (two) times daily. Shake well., Disp: 200 mL, Rfl: 1   aspirin EC 81 MG tablet, Take 81 mg by mouth in the morning and at bedtime. Swallow whole., Disp: , Rfl:    atorvastatin (LIPITOR) 40 MG tablet, TAKE 1 TABLET BY MOUTH EVERYDAY AT BEDTIME, Disp: 90 tablet, Rfl: 1   benzonatate (TESSALON) 200 MG capsule, Take 1 capsule (200 mg total) by mouth 3 (three) times daily as needed., Disp: 60 capsule, Rfl: 1   Blood Glucose Monitoring Suppl (ONE TOUCH ULTRA 2) w/Device KIT, UAD to check BGs E11.9, Disp: 1 kit, Rfl: 0   gabapentin (NEURONTIN) 300 MG capsule, Take 1 capsule (300 mg total) by mouth 3 (three) times daily., Disp: 90 capsule, Rfl: 3   glipiZIDE  (GLUCOTROL XL) 5 MG 24 hr tablet, TAKE 1 TABLET (5 MG TOTAL) BY MOUTH DAILY WITH BREAKFAST. FOR FASTING SUGAR >200., Disp: 90 tablet, Rfl: 0   ibuprofen (ADVIL) 800 MG tablet, Take 800 mg by mouth every 8 (eight) hours as needed for moderate pain., Disp: , Rfl:    lisinopril (ZESTRIL) 10 MG tablet, TAKE 1 TABLET (10 MG TOTAL) BY MOUTH IN THE MORNING AND AT BEDTIME. (STOP TAKING LISINOPRIL 20MG ), Disp: 180 tablet, Rfl: 1   metFORMIN (GLUCOPHAGE-XR) 500 MG 24 hr tablet, TAKE 2 TABLETS BY MOUTH EVERY DAY WITH BREAKFAST, Disp: 180 tablet, Rfl: 0   metoprolol tartrate (LOPRESSOR) 25 MG tablet, TAKE 1 TABLET BY MOUTH TWICE A DAY, Disp: 180 tablet, Rfl: 1   ondansetron (ZOFRAN) 4 MG tablet, Take 1 tablet (4 mg total) by mouth every 8 (eight) hours as needed for nausea or vomiting., Disp: 20 tablet, Rfl: 0   OneTouch Delica Lancets 30G MISC, Check BS BID Dx E11.40, Disp: 200 each, Rfl: 3   ONETOUCH ULTRA test strip, CHECK BLOOD SUGAR TWICE DAILY DX 11.40, Disp: 200 strip, Rfl: 4   pramipexole (MIRAPEX) 0.25 MG tablet, TAKE 1 TABLET 2 HOURS BEFORE BEDTIME FOR RESTLESS LEGS, Disp: 90 tablet, Rfl: 0   Semaglutide, 1 MG/DOSE, 4 MG/3ML SOPN, Inject 1 mg as directed every  7 (seven) days., Disp: 9 mL, Rfl: 3   TURMERIC PO, Take 1 capsule by mouth 2 (two) times daily. , Disp: , Rfl:    valACYclovir (VALTREX) 1000 MG tablet, Take 1 tablet (1,000 mg total) by mouth 3 (three) times daily for 10 days., Disp: 30 tablet, Rfl: 0   zolpidem (AMBIEN) 5 MG tablet, take 1 tablet(5 mg total) by mouth at bedtime as needed for sleep., Disp: 30 tablet, Rfl: 5 Social History   Socioeconomic History   Marital status: Married    Spouse name: Jimmy   Number of children: 2   Years of education: Not on file   Highest education level: Not on file  Occupational History   Occupation: retired  Tobacco Use   Smoking status: Never   Smokeless tobacco: Never  Vaping Use   Vaping status: Never Used  Substance and Sexual Activity    Alcohol use: No   Drug use: No   Sexual activity: Not on file  Other Topics Concern   Not on file  Social History Narrative   2 children  - son lives in [place], daughter 15 minutes away.   09/09/21 - She is having to stay home more to take care of husband who is not in good health   Social Drivers of Health   Financial Resource Strain: Low Risk  (05/21/2022)   Overall Financial Resource Strain (CARDIA)    Difficulty of Paying Living Expenses: Not hard at all  Food Insecurity: No Food Insecurity (05/21/2022)   Hunger Vital Sign    Worried About Running Out of Food in the Last Year: Never true    Ran Out of Food in the Last Year: Never true  Transportation Needs: No Transportation Needs (05/21/2022)   PRAPARE - Administrator, Civil Service (Medical): No    Lack of Transportation (Non-Medical): No  Physical Activity: Insufficiently Active (05/21/2022)   Exercise Vital Sign    Days of Exercise per Week: 3 days    Minutes of Exercise per Session: 30 min  Stress: No Stress Concern Present (05/21/2022)   [employer] of Occupational Health - Occupational Stress Questionnaire    Feeling of Stress : Not at all  Social Connections: Moderately Integrated (12/03/2022)   Social Connection and Isolation Panel [NHANES]    Frequency of Communication with Friends and Family: More than three times a week    Frequency of Social Gatherings with Friends and Family: More than three times a week    Attends Religious Services: More than 4 times per year    Active Member of [employer] or Organizations: No    Attends Banker Meetings: Never    Marital Status: Married  Catering manager Violence: Not At Risk (05/21/2022)   Humiliation, Afraid, Rape, and Kick questionnaire    Fear of Current or Ex-Partner: No    Emotionally Abused: No    Physically Abused: No    Sexually Abused: No   Family History  Problem Relation Age of Onset   Cancer Mother        lung cancer   Cancer  Father        lymphnodes   Diabetes Sister    Lung cancer Sister    Diabetes Brother    Cancer Maternal Grandfather    Heart disease Paternal Grandfather    Diabetes Son    Cancer Maternal Aunt        throat   Cancer Paternal Uncle  unknown    Cancer Other        breast   Colon cancer Neg Hx     Objective: Office vital signs reviewed. BP 135/74   Pulse 89   Temp 98.1 F (36.7 C)   Ht 5\' 5"  (1.651 m)   Wt 155 lb (70.3 kg)   SpO2 90%   BMI 25.79 kg/m   Physical Examination:  General: Awake, alert, well nourished, No acute distress Skin: Erythematous, flat rash appreciated along the left inner groin, left anterior thigh and left posterior lateral buttock and a L2 dermatomal pattern.  There are no active vesicles.  There is no active drainage or evidence of secondary bacterial infection  Assessment/ Plan: 73 y.o. female   Herpes zoster without complication - Plan: valACYclovir (VALTREX) 1000 MG tablet, gabapentin (NEURONTIN) 300 MG capsule  Clinically consistent with a herpes zoster infection affecting the L2 dermatomal pattern.  While she is over 72 hours, we did discuss consideration for treatment given immunocompromise state with type 2 diabetes.  I have started her on Valtrex 3 times daily for 10 days and gabapentin given for as needed use for pain.  Discussed use of topical capsaicin cream if needed.  Discussed that she may continue to have postherpetic neuralgia following resolution of rash and that it would be okay to continue gabapentin if needed.  We have follow-up in March already scheduled so we will see her back then unless any other clinical concerns arise   [name], DO [facility] [phone]

## 2023-03-06 ENCOUNTER — Other Ambulatory Visit: Payer: Self-pay | Admitting: Family

## 2023-03-06 ENCOUNTER — Other Ambulatory Visit: Payer: Self-pay | Admitting: Family Medicine

## 2023-03-06 DIAGNOSIS — E1159 Type 2 diabetes mellitus with other circulatory complications: Secondary | ICD-10-CM

## 2023-03-06 DIAGNOSIS — B9689 Other specified bacterial agents as the cause of diseases classified elsewhere: Secondary | ICD-10-CM

## 2023-03-06 DIAGNOSIS — E1169 Type 2 diabetes mellitus with other specified complication: Secondary | ICD-10-CM

## 2023-03-08 ENCOUNTER — Ambulatory Visit
Admission: EM | Admit: 2023-03-08 | Discharge: 2023-03-08 | Disposition: A | Discharge status: Home / Self Care | Payer: PPO | Attending: Family Medicine | Admitting: Family Medicine

## 2023-03-08 ENCOUNTER — Encounter: Payer: Self-pay | Admitting: Emergency Medicine

## 2023-03-08 ENCOUNTER — Other Ambulatory Visit: Payer: Self-pay

## 2023-03-08 DIAGNOSIS — B029 Zoster without complications: Secondary | ICD-10-CM | POA: Diagnosis not present

## 2023-03-08 MED ORDER — HYDROCODONE-ACETAMINOPHEN 5-325 MG PO TABS
1.0000 | ORAL_TABLET | Freq: Two times a day (BID) | ORAL | 0 refills | Status: DC | PRN
Start: 2023-03-08 — End: 2023-09-01

## 2023-03-08 MED ORDER — PREDNISONE 20 MG PO TABS
40.0000 mg | ORAL_TABLET | Freq: Every day | ORAL | 0 refills | Status: DC
Start: 2023-03-08 — End: 2023-03-25

## 2023-03-08 NOTE — Discharge Instructions (Signed)
 Watch your blood sugars closely while on the prednisone .  Continue the gabapentin , take the hydrocodone  for severe pain twice daily as needed and follow-up as soon as possible with your primary care provider for a recheck.

## 2023-03-08 NOTE — ED Triage Notes (Signed)
 Pt reports "shingles" outbreak since 1/19. Pt reports has been seen for similar x2.

## 2023-03-08 NOTE — ED Provider Notes (Signed)
 [facility]    CSN: [id number] Arrival date & time: 03/08/23  1424      History   Chief Complaint Chief Complaint  Patient presents with   Rash    HPI Candace Lopez is a 73 y.o. female.   Patient presenting today with severe left anterior leg pain and severe pain from shingles rash extending from left lumbar paraspinal region to left medial groin region that started on 02/15/2023.  She was initially seen by primary care who told her she probably burns her self on a heating pad and placed a steroid injection directly into the area left posterior buttock which did not seem to help and cause significant pain.  She was then seen on 02/23/2023 and given a course of Valtrex  for shingles as well as gabapentin  which she states has not been helping at all with her pain.  The rash is still present but seems to be slightly drying up.  Taking over-the-counter pain relievers around-the-clock with minimal relief, states the pain is so severe she cannot even wear underwear.    Past Medical History:  Diagnosis Date   DDD (degenerative disc disease), lumbar    Diabetes mellitus without complication (HCC)    GERD (gastroesophageal reflux disease)    Hyperlipidemia    Hypertension    Urge incontinence of urine     Patient Active Problem List   Diagnosis Date Noted   Diabetes mellitus treated with oral medication (HCC) 01/06/2023   Diabetic polyneuropathy associated with type 2 diabetes mellitus (HCC) 01/06/2023   Mild obesity 09/06/2020   Degenerative disk disease 06/30/2017   Mild obstructive sleep apnea 06/30/2017   Hypertension associated with diabetes (HCC) 03/14/2016   Hyperlipidemia associated with type 2 diabetes mellitus (HCC) 03/14/2016   Restless leg syndrome, familial, uncontrolled 03/14/2016   Urge incontinence 06/28/2011   Left knee pain 01/23/2010    Past Surgical History:  Procedure Laterality Date   ABDOMINAL HYSTERECTOMY     arm surgery     COLONOSCOPY N/A  09/22/2018   Procedure: COLONOSCOPY;  Surgeon: Golda Claudis PENNER, MD;  Location: [facility];  Service: Endoscopy;  Laterality: N/A;  830   FOOT SURGERY     KNEE SURGERY      OB History   No obstetric history on file.      Home Medications    Prior to Admission medications   Medication Sig Start Date End Date Taking? Authorizing Provider  HYDROcodone -acetaminophen  (NORCO/VICODIN) 5-325 MG tablet Take 1 tablet by mouth 2 (two) times daily as needed for moderate pain (pain score 4-6). 03/08/23  Yes Stuart Vernell Norris, PA-C  predniSONE  (DELTASONE ) 20 MG tablet Take 2 tablets (40 mg total) by mouth daily with breakfast. 03/08/23  Yes Stuart Vernell Norris, PA-C  alendronate  (FOSAMAX ) 70 MG tablet Take 1 tablet (70 mg total) by mouth every 7 (seven) days. Take with a full glass of water on an empty stomach. 07/18/22   Jolinda Potter M, DO  Alpha-Lipoic Acid 600 MG CAPS Take 1 capsule (600 mg total) by mouth daily. For diabetic neuropathy 08/28/20   Jolinda Potter HERO, DO  Alum & Mag Hydroxide-Simeth (GI COCKTAIL) SUSP suspension Take 30 mLs by mouth 2 (two) times daily. Shake well. 12/04/22   Lavell Lye A, FNP  aspirin  EC 81 MG tablet Take 81 mg by mouth in the morning and at bedtime. Swallow whole.    [provider]  atorvastatin  (LIPITOR) 40 MG tablet TAKE 1 TABLET BY MOUTH EVERYDAY  AT BEDTIME 09/12/22   Jolinda Potter M, DO  benzonatate  (TESSALON ) 200 MG capsule Take 1 capsule (200 mg total) by mouth 3 (three) times daily as needed. 12/04/22   Lavell Bari LABOR, FNP  Blood Glucose Monitoring Suppl (ONE TOUCH ULTRA 2) w/Device KIT UAD to check BGs E11.9 03/27/21   Jolinda Potter M, DO  gabapentin  (NEURONTIN ) 300 MG capsule Take 1 capsule (300 mg total) by mouth 3 (three) times daily. 02/23/23   Jolinda Potter HERO, DO  glipiZIDE  (GLUCOTROL  XL) 5 MG 24 hr tablet TAKE 1 TABLET (5 MG TOTAL) BY MOUTH DAILY WITH BREAKFAST. FOR FASTING SUGAR >200. 12/24/22   Jolinda Potter M, DO   ibuprofen (ADVIL) 800 MG tablet Take 800 mg by mouth every 8 (eight) hours as needed for moderate pain.    [provider]  lisinopril  (ZESTRIL ) 10 MG tablet TAKE 1 TABLET (10 MG TOTAL) BY MOUTH IN THE MORNING AND AT BEDTIME. (STOP TAKING LISINOPRIL  20MG ) 08/06/22   Jolinda Potter M, DO  metFORMIN  (GLUCOPHAGE -XR) 500 MG 24 hr tablet TAKE 2 TABLETS BY MOUTH EVERY DAY WITH BREAKFAST 12/24/22   Jolinda Potter M, DO  metoprolol  tartrate (LOPRESSOR ) 25 MG tablet TAKE 1 TABLET BY MOUTH TWICE A DAY 09/12/22   Jolinda Potter M, DO  ondansetron  (ZOFRAN ) 4 MG tablet Take 1 tablet (4 mg total) by mouth every 8 (eight) hours as needed for nausea or vomiting. 12/04/22   Lavell Bari LABOR, FNP  OneTouch Delica Lancets 30G MISC Check BS BID Dx E11.40 03/05/20   Jolinda Potter M, DO  ONETOUCH ULTRA test strip CHECK BLOOD SUGAR TWICE DAILY DX 11.40 04/04/22   Jolinda Potter M, DO  pramipexole  (MIRAPEX ) 0.25 MG tablet TAKE 1 TABLET 2 HOURS BEFORE BEDTIME FOR RESTLESS LEGS 01/05/23   Gottschalk, Ashly M, DO  Semaglutide , 1 MG/DOSE, 4 MG/3ML SOPN Inject 1 mg as directed every 7 (seven) days. 01/06/23   Jolinda Potter HERO, DO  TURMERIC PO Take 1 capsule by mouth 2 (two) times daily.     [provider]  zolpidem  (AMBIEN ) 5 MG tablet take 1 tablet(5 mg total) by mouth at bedtime as needed for sleep. 01/06/23   Jolinda Potter HERO, DO    Family History Family History  Problem Relation Age of Onset   Cancer Mother        lung cancer   Cancer Father        lymphnodes   Diabetes Sister    Lung cancer Sister    Diabetes Brother    Cancer Maternal Grandfather    Heart disease Paternal Grandfather    Diabetes Son    Cancer Maternal Aunt        throat   Cancer Paternal Uncle        unknown    Cancer Other        breast   Colon cancer Neg Hx     Social History Social History   Tobacco Use   Smoking status: Never   Smokeless tobacco: Never  Vaping Use   Vaping status: Never Used   Substance Use Topics   Alcohol use: No   Drug use: No     Allergies   Tape   Review of Systems Review of Systems Per HPI  Physical Exam Triage Vital Signs ED Triage Vitals [03/08/23 1520]  Encounter Vitals Group     BP 127/73     Systolic BP Percentile      Diastolic BP Percentile  Pulse Rate 77     Resp 20     Temp 97.9 F (36.6 C)     Temp Source Oral     SpO2 96 %     Weight      Height      Head Circumference      Peak Flow      Pain Score 10     Pain Loc      Pain Education      Exclude from Growth Chart    No data found.  Updated Vital Signs BP 127/73 (BP Location: Right Arm)   Pulse 77   Temp 97.9 F (36.6 C) (Oral)   Resp 20   SpO2 96%   Visual Acuity Right Eye Distance:   Left Eye Distance:   Bilateral Distance:    Right Eye Near:   Left Eye Near:    Bilateral Near:     Physical Exam Vitals and nursing note reviewed.  Constitutional:      Appearance: Normal appearance. She is not ill-appearing.  HENT:     Head: Atraumatic.  Eyes:     Extraocular Movements: Extraocular movements intact.     Conjunctiva/sclera: Conjunctivae normal.  Cardiovascular:     Rate and Rhythm: Normal rate and regular rhythm.     Heart sounds: Normal heart sounds.  Pulmonary:     Effort: Pulmonary effort is normal.     Breath sounds: Normal breath sounds.  Musculoskeletal:        General: Tenderness present. Normal range of motion.     Cervical back: Normal range of motion and neck supple.     Comments: Significant tenderness to palpation over vesicular lesions and extending down to anterior left upper leg  Skin:    General: Skin is warm and dry.     Findings: Rash present.     Comments: Healing vesicular patches extending from left lumbar paraspinal region to left medial groin region  Neurological:     Mental Status: She is alert and oriented to person, place, and time.  Psychiatric:        Mood and Affect: Mood normal.        Thought Content:  Thought content normal.        Judgment: Judgment normal.      UC Treatments / Results  Labs (all labs ordered are listed, but only abnormal results are displayed) Labs Reviewed - No data to display  EKG   Radiology No results found.  Procedures Procedures (including critical care time)  Medications Ordered in UC Medications - No data to display  Initial Impression / Assessment and Plan / UC Course  I have reviewed the triage vital signs and the nursing notes.  Pertinent labs & imaging results that were available during my care of the patient were reviewed by me and considered in my medical decision making (see chart for details).     Still in severe nerve pain with shingles to the left leg and hip/buttock.  Will give an additional course of steroid with careful monitoring of blood sugars as well as a small supply of pain medication for severe pain.  Continue gabapentin , follow-up as soon as possible with primary care for a recheck. Final Clinical Impressions(s) / UC Diagnoses   Final diagnoses:  Herpes zoster without complication     Discharge Instructions      Watch your blood sugars closely while on the prednisone .  Continue the gabapentin , take the hydrocodone  for severe pain twice  daily as needed and follow-up as soon as possible with your primary care provider for a recheck.    ED Prescriptions     Medication Sig Dispense Auth. Provider   predniSONE  (DELTASONE ) 20 MG tablet Take 2 tablets (40 mg total) by mouth daily with breakfast. 10 tablet Stuart Vernell Norris, PA-C   HYDROcodone -acetaminophen  (NORCO/VICODIN) 5-325 MG tablet Take 1 tablet by mouth 2 (two) times daily as needed for moderate pain (pain score 4-6). 10 tablet Stuart Vernell Norris, [place]      I have reviewed the PDMP during this encounter.   Stuart Vernell Norris, [place] 03/08/23 1621

## 2023-03-16 ENCOUNTER — Other Ambulatory Visit: Payer: Self-pay | Admitting: Family Medicine

## 2023-03-16 DIAGNOSIS — E1159 Type 2 diabetes mellitus with other circulatory complications: Secondary | ICD-10-CM

## 2023-03-16 DIAGNOSIS — E1169 Type 2 diabetes mellitus with other specified complication: Secondary | ICD-10-CM

## 2023-03-16 DIAGNOSIS — E1140 Type 2 diabetes mellitus with diabetic neuropathy, unspecified: Secondary | ICD-10-CM

## 2023-03-16 DIAGNOSIS — B029 Zoster without complications: Secondary | ICD-10-CM

## 2023-03-25 ENCOUNTER — Ambulatory Visit
Admission: RE | Admit: 2023-03-25 | Discharge: 2023-03-25 | Disposition: A | Discharge status: Home / Self Care | Payer: PPO | Source: Ambulatory Visit | Attending: Family Medicine | Admitting: Family Medicine

## 2023-03-25 ENCOUNTER — Other Ambulatory Visit: Payer: Self-pay

## 2023-03-25 VITALS — BP 108/60 | HR 81 | Temp 97.6°F | Resp 20

## 2023-03-25 DIAGNOSIS — R42 Dizziness and giddiness: Secondary | ICD-10-CM | POA: Diagnosis not present

## 2023-03-25 DIAGNOSIS — R52 Pain, unspecified: Secondary | ICD-10-CM

## 2023-03-25 DIAGNOSIS — R031 Nonspecific low blood-pressure reading: Secondary | ICD-10-CM

## 2023-03-25 LAB — POCT URINALYSIS DIP (MANUAL ENTRY)
Bilirubin, UA: NEGATIVE
Blood, UA: NEGATIVE
Glucose, UA: 100 mg/dL — AB
Ketones, POC UA: NEGATIVE mg/dL
Nitrite, UA: NEGATIVE
Spec Grav, UA: >=1.03 — AB (ref 1.010–1.025)
Urobilinogen, UA: 0.2 U/dL
pH, UA: 5 (ref 5.0–8.0)

## 2023-03-25 LAB — POCT FASTING CBG KUC MANUAL ENTRY: POCT Glucose (KUC): 137 mg/dL — AB (ref 70–99)

## 2023-03-25 NOTE — Discharge Instructions (Signed)
 Your vital signs and exam are very reassuring today as are your EKG and so far other point-of-care labs.  Your blood work should be back tomorrow and someone will call with abnormalities if noted.  Call your primary care provider as soon as possible and see when the soonest they can see you for your low blood pressure readings, dizziness and overall not feeling well.  Go to the emergency department if your symptoms worsen at any time.  Make sure to stay well-hydrated and eat a proper diet.

## 2023-03-25 NOTE — ED Triage Notes (Signed)
 States her body feels sore and she is having dizzy "spells" x 3 days.  States right ear feels sore.   Recent hx of shingles

## 2023-03-26 LAB — BASIC METABOLIC PANEL
BUN/Creatinine Ratio: 29 — ABNORMAL HIGH (ref 12–28)
BUN: 39 mg/dL — ABNORMAL HIGH (ref 8–27)
CO2: 19 mmol/L — ABNORMAL LOW (ref 20–29)
Calcium: 10.2 mg/dL (ref 8.7–10.3)
Chloride: 105 mmol/L (ref 96–106)
Creatinine, Ser: 1.36 mg/dL — ABNORMAL HIGH (ref 0.57–1.00)
Glucose: 135 mg/dL — ABNORMAL HIGH (ref 70–99)
Potassium: 5.3 mmol/L — ABNORMAL HIGH (ref 3.5–5.2)
Sodium: 140 mmol/L (ref 134–144)
eGFR: 42 mL/min/{1.73_m2} — ABNORMAL LOW (ref >59)

## 2023-03-26 LAB — CBC WITH DIFFERENTIAL/PLATELET
Basophils Absolute: 0 10*3/uL (ref 0.0–0.2)
Basos: 0 %
EOS (ABSOLUTE): 0.1 10*3/uL (ref 0.0–0.4)
Eos: 2 %
Hematocrit: 41.1 % (ref 34.0–46.6)
Hemoglobin: 14.1 g/dL (ref 11.1–15.9)
Immature Grans (Abs): 0 10*3/uL (ref 0.0–0.1)
Immature Granulocytes: 0 %
Lymphocytes Absolute: 1.5 10*3/uL (ref 0.7–3.1)
Lymphs: 20 %
MCH: 32.1 pg (ref 26.6–33.0)
MCHC: 34.3 g/dL (ref 31.5–35.7)
MCV: 94 fL (ref 79–97)
Monocytes Absolute: 0.5 10*3/uL (ref 0.1–0.9)
Monocytes: 6 %
Neutrophils Absolute: 5.4 10*3/uL (ref 1.4–7.0)
Neutrophils: 72 %
Platelets: 230 10*3/uL (ref 150–450)
RBC: 4.39 x10E6/uL (ref 3.77–5.28)
RDW: 14.6 % (ref 11.7–15.4)
WBC: 7.6 10*3/uL (ref 3.4–10.8)

## 2023-03-30 ENCOUNTER — Other Ambulatory Visit: Payer: Self-pay | Admitting: *Deleted

## 2023-03-30 NOTE — Patient Outreach (Signed)
 Care Management   Visit Note  03/30/2023 Name: Candace Lopez MRN: [id number] DOB: May 05, 1950  Subjective: Candace Lopez is a 73 y.o. year old female who is a primary care patient of [name], DO. The Care Management team was consulted for assistance.      Engaged with patient spoke with patient by telephone.    Goals Addressed             This Visit's Progress    RNCM Care Management Expected: Monitor, Self-Manage & Reduce Symptoms of: DM       Current Barriers:  Knowledge Deficits related to plan of care for management of HTN, HLD, and DMII  Chronic Disease Management support and education needs related to HTN, HLD, and DMII   RNCM Clinical Goal(s):  Patient will verbalize basic understanding of  HTN, HLD, and DMII disease process and self health management plan as evidenced by verbal explanation, recognizing symptoms, lifestyle modification take all medications exactly as prescribed and will call provider for medication related questions as evidenced by compliance with all medications attend all scheduled medical appointments: with primary care provider and specialist as evidenced by keeping all scheduled appointments demonstrate Improved and Ongoing adherence to prescribed treatment plan for HTN, HLD, and DMII as evidenced by consistent medication compliance, symptom monitoring, continued lifestyle modifications continue to work with RN Care Manager to address care management and care coordination needs related to  HTN, HLD, and DMII as evidenced by adherence to CM Team Scheduled appointments through collaboration with RN Care manager, provider, and care team.   Interventions: Evaluation of current treatment plan related to  self management and patient's adherence to plan as established by provider   Diabetes Interventions:  (Status:  Goal on track:  Yes.) Long Term Goal Assessed patient's understanding of A1c goal: <7% Provided education to patient about basic DM  disease process. Patient reports that she is doing well managing her diabetes. She reports that she has been having trouble with her ozempic pen delivering the dosage. She reports that she has a 4 mg pen and she is having to inject 4 times to empty her pen. RNCM sent request to PharmD to follow up with patient to ensure that she is correctly using her pen. She reports no GI issues at this time. She reports continued issues with her shingles and reports the left side still has open lesions. Reviewed medications with patient and discussed importance of medication adherence. Reports compliance with all medications Counseled on importance of regular laboratory monitoring as prescribed Discussed plans with patient for ongoing care management follow up and provided patient with direct contact information for care management team Provided patient with written educational materials related to hypo and hyperglycemia and importance of correct treatment. No reports of hypo/hyperglycemia. Reviewed scheduled/upcoming provider appointments including: 04-14-2023 with PCP Advised patient, providing education and rationale, to check cbg once daily, fasting and record, calling provider for findings outside established parameters. Reports fasting glucose this morning of 126. Lowest: 113 Highest: 170 Review of patient status, including review of consultants reports, relevant laboratory and other test results, and medications completed Screening for signs and symptoms of depression related to chronic disease state  Assessed social determinant of health barriers Lab Results  Component Value Date   HGBA1C 6.7 (H) 01/06/2023    Hyperlipidemia Interventions:  (Status:  Goal on track:  Yes.) Long Term Goal Medication review performed; medication list updated in electronic medical record.  Provider established cholesterol goals reviewed Counseled on  importance of regular laboratory monitoring as prescribed Provided HLD  educational materials Reviewed role and benefits of statin for ASCVD risk reduction. Reports compliance with Lipitor Discussed strategies to manage statin-induced myalgias. Denies myalgias Reviewed importance of limiting foods high in cholesterol. Patient states she eats an overall healthy diet. Reviewed exercise goals and target of 150 minutes per week Screening for signs and symptoms of depression related to chronic disease state Assessed social determinant of health barriers   Hypertension Interventions:  (Status:  Goal on track:  Yes.) Long Term Goal Last practice recorded BP readings:  BP Readings from Last 3 Encounters:  03/25/23 108/60  03/08/23 127/73  02/23/23 135/74   Most recent eGFR/CrCl:  Lab Results  Component Value Date   EGFR 42 (L) 03/25/2023    No components found for: "CRCL"  Evaluation of current treatment plan related to hypertension self management and patient's adherence to plan as established by provider. Regularly checks BP. Denies any chest pain, dizziness or swelling at this time. Reports BP 130/60 this morning. Provided education to patient re: stroke prevention, s/s of heart attack and stroke. Education and support provided. Reviewed medications with patient and discussed importance of compliance. Reports compliance with all medications Counseled on adverse effects of illicit drug and excessive alcohol use in patients with high blood pressure. Denies illicit drug or excessive alcohol use Counseled on the importance of exercise goals with target of 150 minutes per week Discussed plans with patient for ongoing care management follow up and provided patient with direct contact information for care management team Advised patient, providing education and rationale, to monitor blood pressure daily and record, calling PCP for findings outside established parameters. RNCM reviewed with patient goal SBP<140 DBP <90. Reviewed scheduled/upcoming provider appointments  including: 04-14-2023  with PCP Advised patient to discuss elevated or low BP readings with provider Provided education on prescribed diet Heart Healthy, ADA Discussed complications of poorly controlled blood pressure such as heart disease, stroke, circulatory complications, vision complications, kidney impairment, sexual dysfunction Screening for signs and symptoms of depression related to chronic disease state  Assessed social determinant of health barriers   Patient Goals/Self-Care Activities: Take all medications as prescribed Attend all scheduled provider appointments Call pharmacy for medication refills 3-7 days in advance of running out of medications Attend church or other social activities Perform all self care activities independently  Perform IADL's (shopping, preparing meals, housekeeping, managing finances) independently Call provider office for new concerns or questions  schedule appointment with eye doctor check blood sugar at prescribed times: once daily check feet daily for cuts, sores or redness enter blood sugar readings and medication or insulin into daily log  Follow Up Plan:  Telephone follow up appointment with care management team member scheduled for:  04-27-2023 at 9:00 am           Consent to Services:  Patient was given information about care management services, agreed to services, and gave verbal consent to participate.   Plan: Telephone follow up appointment with care management team member scheduled for:04-27-2023 at 9:00 am  Larey Brick, BSN RN [facility], [facility] RN Care Manager Direct Dial: [phone]  Fax: [phone]

## 2023-03-30 NOTE — Patient Instructions (Signed)
 Visit Information  Thank you for taking time to visit with me today. Please don't hesitate to contact me if I can be of assistance to you before our next scheduled telephone appointment.  Following are the goals we discussed today:   Goals Addressed             This Visit's Progress    RNCM Care Management Expected: Monitor, Self-Manage & Reduce Symptoms of: DM       Current Barriers:  Knowledge Deficits related to plan of care for management of HTN, HLD, and DMII  Chronic Disease Management support and education needs related to HTN, HLD, and DMII   RNCM Clinical Goal(s):  Patient will verbalize basic understanding of  HTN, HLD, and DMII disease process and self health management plan as evidenced by verbal explanation, recognizing symptoms, lifestyle modification take all medications exactly as prescribed and will call provider for medication related questions as evidenced by compliance with all medications attend all scheduled medical appointments: with primary care provider and specialist as evidenced by keeping all scheduled appointments demonstrate Improved and Ongoing adherence to prescribed treatment plan for HTN, HLD, and DMII as evidenced by consistent medication compliance, symptom monitoring, continued lifestyle modifications continue to work with RN Care Manager to address care management and care coordination needs related to  HTN, HLD, and DMII as evidenced by adherence to CM Team Scheduled appointments through collaboration with RN Care manager, provider, and care team.   Interventions: Evaluation of current treatment plan related to  self management and patient's adherence to plan as established by provider   Diabetes Interventions:  (Status:  Goal on track:  Yes.) Long Term Goal Assessed patient's understanding of A1c goal: <7% Provided education to patient about basic DM disease process. Patient reports that she is doing well managing her diabetes. She reports that  she has been having trouble with her ozempic pen delivering the dosage. She reports that she has a 4 mg pen and she is having to inject 4 times to empty her pen. RNCM sent request to PharmD to follow up with patient to ensure that she is correctly using her pen. She reports no GI issues at this time. She reports continued issues with her shingles and reports the left side still has open lesions. Reviewed medications with patient and discussed importance of medication adherence. Reports compliance with all medications Counseled on importance of regular laboratory monitoring as prescribed Discussed plans with patient for ongoing care management follow up and provided patient with direct contact information for care management team Provided patient with written educational materials related to hypo and hyperglycemia and importance of correct treatment. No reports of hypo/hyperglycemia. Reviewed scheduled/upcoming provider appointments including: 04-14-2023 with PCP Advised patient, providing education and rationale, to check cbg once daily, fasting and record, calling provider for findings outside established parameters. Reports fasting glucose this morning of 126. Lowest: 113 Highest: 170 Review of patient status, including review of consultants reports, relevant laboratory and other test results, and medications completed Screening for signs and symptoms of depression related to chronic disease state  Assessed social determinant of health barriers Lab Results  Component Value Date   HGBA1C 6.7 (H) 01/06/2023    Hyperlipidemia Interventions:  (Status:  Goal on track:  Yes.) Long Term Goal Medication review performed; medication list updated in electronic medical record.  Provider established cholesterol goals reviewed Counseled on importance of regular laboratory monitoring as prescribed Provided HLD educational materials Reviewed role and benefits of statin for ASCVD risk  reduction. Reports  compliance with Lipitor Discussed strategies to manage statin-induced myalgias. Denies myalgias Reviewed importance of limiting foods high in cholesterol. Patient states she eats an overall healthy diet. Reviewed exercise goals and target of 150 minutes per week Screening for signs and symptoms of depression related to chronic disease state Assessed social determinant of health barriers   Hypertension Interventions:  (Status:  Goal on track:  Yes.) Long Term Goal Last practice recorded BP readings:  BP Readings from Last 3 Encounters:  03/25/23 108/60  03/08/23 127/73  02/23/23 135/74   Most recent eGFR/CrCl:  Lab Results  Component Value Date   EGFR 42 (L) 03/25/2023    No components found for: "CRCL"  Evaluation of current treatment plan related to hypertension self management and patient's adherence to plan as established by provider. Regularly checks BP. Denies any chest pain, dizziness or swelling at this time. Reports BP 130/60 this morning. Provided education to patient re: stroke prevention, s/s of heart attack and stroke. Education and support provided. Reviewed medications with patient and discussed importance of compliance. Reports compliance with all medications Counseled on adverse effects of illicit drug and excessive alcohol use in patients with high blood pressure. Denies illicit drug or excessive alcohol use Counseled on the importance of exercise goals with target of 150 minutes per week Discussed plans with patient for ongoing care management follow up and provided patient with direct contact information for care management team Advised patient, providing education and rationale, to monitor blood pressure daily and record, calling PCP for findings outside established parameters. RNCM reviewed with patient goal SBP<140 DBP <90. Reviewed scheduled/upcoming provider appointments including: 04-14-2023  with PCP Advised patient to discuss elevated or low BP readings with  provider Provided education on prescribed diet Heart Healthy, ADA Discussed complications of poorly controlled blood pressure such as heart disease, stroke, circulatory complications, vision complications, kidney impairment, sexual dysfunction Screening for signs and symptoms of depression related to chronic disease state  Assessed social determinant of health barriers   Patient Goals/Self-Care Activities: Take all medications as prescribed Attend all scheduled provider appointments Call pharmacy for medication refills 3-7 days in advance of running out of medications Attend church or other social activities Perform all self care activities independently  Perform IADL's (shopping, preparing meals, housekeeping, managing finances) independently Call provider office for new concerns or questions  schedule appointment with eye doctor check blood sugar at prescribed times: once daily check feet daily for cuts, sores or redness enter blood sugar readings and medication or insulin into daily log  Follow Up Plan:  Telephone follow up appointment with care management team member scheduled for:  04-27-2023 at 9:00 am           Our next appointment is by telephone on 04-27-2023 at 9:00 am  Please call the care guide team at [phone] if you need to cancel or reschedule your appointment.   If you are experiencing a Mental Health or Behavioral Health Crisis or need someone to talk to, please call the Suicide and Crisis Lifeline: 988 call the [place] National Suicide Prevention Lifeline: 916-472-1356 or TTY: 785-154-4975 TTY 6465891581) to talk to a trained counselor call 1-800-273-TALK (toll free, 24 hour hotline)   Patient verbalizes understanding of instructions and care plan provided today and agrees to view in MyChart. Active MyChart status and patient understanding of how to access instructions and care plan via MyChart confirmed with patient.     Telephone follow up appointment with  care management team member  scheduled for:04-27-2023 at 9:00 am  Larey Brick, BSN RN [facility], [facility] RN Care Manager Direct Dial: [phone]  Fax: [phone]

## 2023-03-31 NOTE — ED Provider Notes (Signed)
 [facility]    CSN: [id number] Arrival date & time: 03/25/23  0857      History   Chief Complaint Chief Complaint  Patient presents with   dizzy, right ear soreness    HPI Candace Lopez is a 72 y.o. female.   Patient presenting today with dizzy spells for the past 3 days off and on, body aches, right ear soreness and pressure.  Denies cough, chest pain, shortness of breath, palpitations, visual change, mental status changes, weakness numbness or tingling of extremities.  She does note her blood pressures have been much lower than usual the last few days but no recent medication changes.  States they have been in the 90s over 60s range.  She takes lisinopril and metoprolol daily ongoing for hypertension.  So far not trying anything over-the-counter for symptoms.  Recent history of shingles to the left buttock region treated with antiviral, steroids.  Still having the rash but no further spread or worsening.    Past Medical History:  Diagnosis Date   DDD (degenerative disc disease), lumbar    Diabetes mellitus without complication (HCC)    GERD (gastroesophageal reflux disease)    Hyperlipidemia    Hypertension    Urge incontinence of urine     Patient Active Problem List   Diagnosis Date Noted   Diabetes mellitus treated with oral medication (HCC) 01/06/2023   Diabetic polyneuropathy associated with type 2 diabetes mellitus (HCC) 01/06/2023   Mild obesity 09/06/2020   Degenerative disk disease 06/30/2017   Mild obstructive sleep apnea 06/30/2017   Hypertension associated with diabetes (HCC) 03/14/2016   Hyperlipidemia associated with type 2 diabetes mellitus (HCC) 03/14/2016   Restless leg syndrome, familial, uncontrolled 03/14/2016   Urge incontinence 06/28/2011   Left knee pain 01/23/2010    Past Surgical History:  Procedure Laterality Date   ABDOMINAL HYSTERECTOMY     arm surgery     COLONOSCOPY N/A 09/22/2018   Procedure: COLONOSCOPY;  Surgeon:  Malissa Hippo, MD;  Location: [facility];  Service: Endoscopy;  Laterality: N/A;  830   FOOT SURGERY     KNEE SURGERY      OB History   No obstetric history on file.      Home Medications    Prior to Admission medications   Medication Sig Start Date End Date Taking? Authorizing Provider  alendronate (FOSAMAX) 70 MG tablet Take 1 tablet (70 mg total) by mouth every 7 (seven) days. Take with a full glass of water on an empty stomach. 07/18/22   Delynn Flavin M, DO  Alpha-Lipoic Acid 600 MG CAPS Take 1 capsule (600 mg total) by mouth daily. For diabetic neuropathy 08/28/20   [name], DO  Alum & Mag Hydroxide-Simeth (GI COCKTAIL) SUSP suspension Take 30 mLs by mouth 2 (two) times daily. Shake well. Patient not taking: Reported on 03/30/2023 12/04/22   Junie Spencer, FNP  aspirin EC 81 MG tablet Take 81 mg by mouth in the morning and at bedtime. Swallow whole.    [provider]  atorvastatin (LIPITOR) 40 MG tablet TAKE 1 TABLET BY MOUTH EVERYDAY AT BEDTIME 03/09/23   Gottschalk, Ashly M, DO  benzonatate (TESSALON) 200 MG capsule Take 1 capsule (200 mg total) by mouth 3 (three) times daily as needed. Patient not taking: Reported on 03/30/2023 12/04/22   Jannifer Rodney A, FNP  Blood Glucose Monitoring Suppl (ONE TOUCH ULTRA 2) w/Device KIT UAD to check BGs E11.9 03/27/21   Delynn Flavin  M, DO  gabapentin (NEURONTIN) 300 MG capsule Take 1 capsule (300 mg total) by mouth 3 (three) times daily. 02/23/23   [name], DO  glipiZIDE (GLUCOTROL XL) 5 MG 24 hr tablet TAKE 1 TABLET (5 MG TOTAL) BY MOUTH DAILY WITH BREAKFAST. FOR FASTING SUGAR >200. Patient not taking: Reported on 03/30/2023 12/24/22   [name], DO  HYDROcodone-acetaminophen (NORCO/VICODIN) 5-325 MG tablet Take 1 tablet by mouth 2 (two) times daily as needed for moderate pain (pain score 4-6). 03/08/23   Particia Nearing, PA-C  ibuprofen (ADVIL) 800 MG tablet Take 800 mg by mouth every 8  (eight) hours as needed for moderate pain. Patient not taking: Reported on 03/30/2023    [provider]  lisinopril (ZESTRIL) 10 MG tablet TAKE 1 TABLET (10 MG TOTAL) BY MOUTH IN THE MORNING AND AT BEDTIME. (STOP TAKING LISINOPRIL 20MG ) 03/09/23   Delynn Flavin M, DO  metFORMIN (GLUCOPHAGE-XR) 500 MG 24 hr tablet TAKE 2 TABLETS BY MOUTH EVERY DAY WITH BREAKFAST 03/17/23   Delynn Flavin M, DO  metoprolol tartrate (LOPRESSOR) 25 MG tablet TAKE 1 TABLET BY MOUTH TWICE A DAY 03/17/23   Gottschalk, Ashly M, DO  ondansetron (ZOFRAN) 4 MG tablet Take 1 tablet (4 mg total) by mouth every 8 (eight) hours as needed for nausea or vomiting. Patient not taking: Reported on 03/30/2023 12/04/22   Junie Spencer, FNP  OneTouch Delica Lancets 30G MISC Check BS BID Dx E11.40 03/05/20   Delynn Flavin M, DO  ONETOUCH ULTRA test strip CHECK BLOOD SUGAR TWICE DAILY DX 11.40 04/04/22   Delynn Flavin M, DO  pramipexole (MIRAPEX) 0.25 MG tablet TAKE 1 TABLET 2 HOURS BEFORE BEDTIME FOR RESTLESS LEGS 01/05/23   Gottschalk, Ashly M, DO  Semaglutide, 1 MG/DOSE, 4 MG/3ML SOPN Inject 1 mg as directed every 7 (seven) days. 01/06/23   [name], DO  TURMERIC PO Take 1 capsule by mouth 2 (two) times daily.     [provider]  valACYclovir (VALTREX) 1000 MG tablet TAKE 1 TABLET (1,000 MG TOTAL) BY MOUTH 3 TIMES A DAY FOR 10 DAYS 03/17/23   Delynn Flavin M, DO  zolpidem (AMBIEN) 5 MG tablet take 1 tablet(5 mg total) by mouth at bedtime as needed for sleep. 01/06/23   [name], DO    Family History Family History  Problem Relation Age of Onset   Cancer Mother        lung cancer   Cancer Father        lymphnodes   Diabetes Sister    Lung cancer Sister    Diabetes Brother    Cancer Maternal Grandfather    Heart disease Paternal Grandfather    Diabetes Son    Cancer Maternal Aunt        throat   Cancer Paternal Uncle        unknown    Cancer Other        breast   Colon  cancer Neg Hx     Social History Social History   Tobacco Use   Smoking status: Never   Smokeless tobacco: Never  Vaping Use   Vaping status: Never Used  Substance Use Topics   Alcohol use: No   Drug use: No     Allergies   Tape   Review of Systems Review of Systems Per HPI  Physical Exam Triage Vital Signs ED Triage Vitals  Encounter Vitals Group     BP 03/25/23 0901 108/60  Systolic BP Percentile --      Diastolic BP Percentile --      Pulse Rate 03/25/23 0901 81     Resp 03/25/23 0901 20     Temp 03/25/23 0901 97.6 F (36.4 C)     Temp Source 03/25/23 0901 Oral     SpO2 03/25/23 0901 95 %     Weight --      Height --      Head Circumference --      Peak Flow --      Pain Score 03/25/23 0904 0     Pain Loc --      Pain Education --      Exclude from Growth Chart --    No data found.  Updated Vital Signs BP 108/60 (BP Location: Right Arm)   Pulse 81   Temp 97.6 F (36.4 C) (Oral)   Resp 20   SpO2 95%   Visual Acuity Right Eye Distance:   Left Eye Distance:   Bilateral Distance:    Right Eye Near:   Left Eye Near:    Bilateral Near:     Physical Exam Vitals and nursing note reviewed.  Constitutional:      Appearance: Normal appearance. She is not ill-appearing.  HENT:     Head: Atraumatic.     Right Ear: Tympanic membrane normal.     Left Ear: Tympanic membrane normal.     Nose: Nose normal.     Mouth/Throat:     Mouth: Mucous membranes are moist.     Pharynx: Oropharynx is clear.  Eyes:     Extraocular Movements: Extraocular movements intact.     Conjunctiva/sclera: Conjunctivae normal.  Cardiovascular:     Rate and Rhythm: Normal rate and regular rhythm.     Heart sounds: Normal heart sounds.  Pulmonary:     Effort: Pulmonary effort is normal.     Breath sounds: Normal breath sounds.  Musculoskeletal:        General: Normal range of motion.     Cervical back: Normal range of motion and neck supple.  Skin:    General: Skin  is warm and dry.     Findings: Rash present.     Comments: Resolving shingles rash to the left buttock and flank region  Neurological:     General: No focal deficit present.     Mental Status: She is alert and oriented to person, place, and time.     Cranial Nerves: No cranial nerve deficit.     Motor: No weakness.     Gait: Gait normal.  Psychiatric:        Mood and Affect: Mood normal.        Thought Content: Thought content normal.        Judgment: Judgment normal.      UC Treatments / Results  Labs (all labs ordered are listed, but only abnormal results are displayed) Labs Reviewed  BASIC METABOLIC PANEL - Abnormal; Notable for the following components:      Result Value   Glucose 135 (*)    BUN 39 (*)    Creatinine, Ser 1.36 (*)    eGFR 42 (*)    BUN/Creatinine Ratio 29 (*)    Potassium 5.3 (*)    CO2 19 (*)    All other components within normal limits   Narrative:    Performed at:  [address], [place], [place]  [id number] Lab Director: Jolene Schimke MD,  Phone:  [phone]  POCT FASTING CBG [facility] MANUAL ENTRY - Abnormal; Notable for the following components:   POCT Glucose ([facility]) 137 (*)    All other components within normal limits  POCT URINALYSIS DIP (MANUAL ENTRY) - Abnormal; Notable for the following components:   Glucose, UA =100 (*)    Spec Grav, UA >=1.030 (*)    Protein Ur, POC trace (*)    Leukocytes, UA Trace (*)    All other components within normal limits  CBC WITH DIFFERENTIAL/PLATELET   Narrative:    Performed at:  [address], [place], [place]  [id number] Lab Director: Jolene Schimke MD, Phone:  [phone]    EKG   Radiology No results found.  Procedures Procedures (including critical care time)  Medications Ordered in UC Medications - No data to display  Initial Impression / Assessment and Plan / UC Course  I have reviewed the triage vital signs and the nursing notes.  Pertinent  labs & imaging results that were available during my care of the patient were reviewed by me and considered in my medical decision making (see chart for details).     Vital signs and exam very reassuring today with no red flag findings.  Point-of-care glucose 137 random, labs pending for further evaluation, orthostatic vital signs without significant change and EKG normal sinus rhythm at 79 bpm without acute ST or T wave changes.  Unclear if her dizzy spells are related to low blood pressure readings, possibly some mild dehydration, possible viral cause.  Push fluids, follow-up as soon as possible with primary care to discuss if any changes need to be made to blood pressure regimen while this is ongoing and further evaluation if needed.  ED for any worsening symptoms.  45 minutes spent today in direct patient care, evaluation and education  Final Clinical Impressions(s) / UC Diagnoses   Final diagnoses:  Dizziness  Generalized body aches  Nonspecific low blood pressure reading     Discharge Instructions      Your vital signs and exam are very reassuring today as are your EKG and so far other point-of-care labs.  Your blood work should be back tomorrow and someone will call with abnormalities if noted.  Call your primary care provider as soon as possible and see when the soonest they can see you for your low blood pressure readings, dizziness and overall not feeling well.  Go to the emergency department if your symptoms worsen at any time.  Make sure to stay well-hydrated and eat a proper diet.    ED Prescriptions   None    PDMP not reviewed this encounter.   Particia Nearing, [place] 03/31/23 [phone]

## 2023-04-09 ENCOUNTER — Other Ambulatory Visit (INDEPENDENT_AMBULATORY_CARE_PROVIDER_SITE_OTHER)

## 2023-04-09 DIAGNOSIS — Z7985 Long-term (current) use of injectable non-insulin antidiabetic drugs: Secondary | ICD-10-CM

## 2023-04-09 DIAGNOSIS — E119 Type 2 diabetes mellitus without complications: Secondary | ICD-10-CM

## 2023-04-09 NOTE — Progress Notes (Signed)
 04/09/2023 Name: LANITRA BATTAGLINI MRN: [id number] DOB: 31-Jul-1950  Chief Complaint  Patient presents with   Diabetes    DONNARAE RAE is a 73 y.o. year old female who presented for a telephone visit. I connected with  [name] on 04/09/23 by telephone and verified that I am speaking with the correct person using two identifiers. I discussed the limitations of evaluation and management by telemedicine. The patient expressed understanding and agreed to proceed.  Patient was located in her home and PharmD in PCP office during this visit.   They were referred to the pharmacist by their PCP for assistance in managing diabetes and medication access.    Subjective:  Patient reported she took 4mg  of Ozempic at once last Saturday as she had a new pen and her dose was increased.  She was only prescribed 1mg  weekly.  Care Team: Primary Care Provider: [name], DO ; Next Scheduled Visit: 04/14/23   Medication Access/Adherence  Current Pharmacy:  [facility] [phone] - [place], [place] - [address] [place] [address] Phone: [phone] Fax: [phone]  [facility] - [place], [place] - [address] [place] [address] Phone: [phone] Fax: [phone]   Patient reports affordability concerns with their medications: Yes  Patient reports access/transportation concerns to their pharmacy: No  Patient reports adherence concerns with their medications:  No    Diabetes:  Current medications: Ozempic, metformin Medications tried in the past: linagliptin, sitagliptin, saxagliptin, glipizide   Current glucose readings: 100-130s, a few readings ~160-170 when she had had a carb heavy meal the night before Using one touch ultra meter; testing 1 time daily   Patient denies hypoglycemic s/sx including dizziness, shakiness, sweating. Patient denies hyperglycemic symptoms including polyuria, polydipsia, polyphagia, nocturia,  neuropathy, blurred vision.   Current physical activity: encouraged   Current medication access support: enrolled in [employer] PAP until 01/26/2023 (med ships to office)   Objective:  Lab Results  Component Value Date   HGBA1C 6.7 (H) 01/06/2023    Lab Results  Component Value Date   CREATININE 1.36 (H) 03/25/2023   BUN 39 (H) 03/25/2023   NA 140 03/25/2023   K 5.3 (H) 03/25/2023   CL 105 03/25/2023   CO2 19 (L) 03/25/2023    Lab Results  Component Value Date   CHOL 131 04/04/2022   HDL 30 (L) 04/04/2022   LDLCALC 67 04/04/2022   LDLDIRECT 67 12/13/2019   TRIG 202 (H) 04/04/2022   CHOLHDL 4.4 04/04/2022    Medications Reviewed Today     Reviewed by Danella Maiers, [facility] (Pharmacist) on 04/09/23 at [phone]  Med List Status: <None>   Medication Order Taking? Sig Documenting Provider Last Dose Status Informant  alendronate (FOSAMAX) 70 MG tablet [id number] No Take 1 tablet (70 mg total) by mouth every 7 (seven) days. Take with a full glass of water on an empty stomach. Delynn Flavin M, DO Taking Active   Alpha-Lipoic Acid 600 MG CAPS [id number] No Take 1 capsule (600 mg total) by mouth daily. For diabetic neuropathy [name], DO Taking Active            Med Note Abbe Amsterdam, AMY E   Mon Sep 09, 2021  1:38 PM) Taking every other day  Alum & Mag Hydroxide-Simeth (GI COCKTAIL) SUSP suspension [id number] No Take 30 mLs by mouth 2 (two) times daily. Shake well.  Patient not taking: Reported on  03/30/2023   Junie Spencer, FNP Not Taking Active   aspirin EC 81 MG tablet [id number] No Take 81 mg by mouth in the morning and at bedtime. Swallow whole. [provider] Taking Active   atorvastatin (LIPITOR) 40 MG tablet [id number] No TAKE 1 TABLET BY MOUTH EVERYDAY AT BEDTIME Gottschalk, Ashly M, DO Taking Active   benzonatate (TESSALON) 200 MG capsule [id number] No Take 1 capsule (200 mg total) by mouth 3 (three) times daily as needed.  Patient not taking: Reported  on 03/30/2023   Junie Spencer, FNP Not Taking Active   Blood Glucose Monitoring Suppl (ONE TOUCH ULTRA 2) w/Device KIT [id number] No UAD to check BGs E11.9 Delynn Flavin M, DO Taking Active   gabapentin (NEURONTIN) 300 MG capsule [id number] No Take 1 capsule (300 mg total) by mouth 3 (three) times daily. Delynn Flavin M, DO Taking Active   glipiZIDE (GLUCOTROL XL) 5 MG 24 hr tablet [id number] No TAKE 1 TABLET (5 MG TOTAL) BY MOUTH DAILY WITH BREAKFAST. FOR FASTING SUGAR >200.  Patient not taking: Reported on 03/30/2023   [name], DO Not Taking Active   HYDROcodone-acetaminophen (NORCO/VICODIN) 5-325 MG tablet [id number] No Take 1 tablet by mouth 2 (two) times daily as needed for moderate pain (pain score 4-6). Particia Nearing, [place] Taking Active   ibuprofen (ADVIL) 800 MG tablet [id number] No Take 800 mg by mouth every 8 (eight) hours as needed for moderate pain.  Patient not taking: Reported on 03/30/2023   [provider] Not Taking Active   lisinopril (ZESTRIL) 10 MG tablet [id number] No TAKE 1 TABLET (10 MG TOTAL) BY MOUTH IN THE MORNING AND AT BEDTIME. (STOP TAKING LISINOPRIL 20MG ) Delynn Flavin M, DO Taking Active   metFORMIN (GLUCOPHAGE-XR) 500 MG 24 hr tablet [id number] No TAKE 2 TABLETS BY MOUTH EVERY DAY WITH BREAKFAST Delynn Flavin M, DO Taking Active   metoprolol tartrate (LOPRESSOR) 25 MG tablet [id number] No TAKE 1 TABLET BY MOUTH TWICE A DAY Gottschalk, Ashly M, DO Taking Active   ondansetron (ZOFRAN) 4 MG tablet [id number] No Take 1 tablet (4 mg total) by mouth every 8 (eight) hours as needed for nausea or vomiting.  Patient not taking: Reported on 03/30/2023   Junie Spencer, FNP Not Taking Active   OneTouch Delica Lancets 30G MISC [id number] No Check BS BID Dx E11.40 [name], DO Taking Active   [facility] ULTRA test strip [id number] No CHECK BLOOD SUGAR TWICE DAILY DX 11.40 Delynn Flavin M, DO Taking Active   pramipexole (MIRAPEX) 0.25  MG tablet [id number] No TAKE 1 TABLET 2 HOURS BEFORE BEDTIME FOR RESTLESS LEGS [name], DO Taking Active   Semaglutide, 1 MG/DOSE, 4 MG/3ML SOPN [id number] No Inject 1 mg as directed every 7 (seven) days. [name], DO Taking Active   TURMERIC PO [id number] No Take 1 capsule by mouth 2 (two) times daily.  [provider] Taking Active Self  valACYclovir (VALTREX) 1000 MG tablet [id number] No TAKE 1 TABLET (1,000 MG TOTAL) BY MOUTH 3 TIMES A DAY FOR 10 DAYS Gottschalk, Ashly M, DO Taking Active   zolpidem (AMBIEN) 5 MG tablet [id number] No take 1 tablet(5 mg total) by mouth at bedtime as needed for sleep. [name], DO Taking Active            Assessment/Plan:   Diabetes: - Currently controlled - Reviewed long term cardiovascular and renal outcomes of uncontrolled blood sugar - Reviewed goal A1c, goal fasting, and goal  2 hour post prandial glucose - Recommend to hold Ozempic until 3/22 due to large dose Patient reports skin irritation Denies additional GI adverse events Blood sugar remains controlled  - Patient denies personal or family history of multiple endocrine neoplasia type 2, medullary thyroid cancer; personal history of pancreatitis or gallbladder disease. - Recommend to check glucose daily (fasting) or if symptomatic - Enrolled in Ozempic patient assistance program through [employer] PAP. Will collaborate with provider, CPhT, and patient to pursue assistance.    Follow Up Plan: AS NEEDED, PCP 04/14/23   [name], PharmD, BCACP, CPP Clinical Pharmacist, [facility]

## 2023-04-14 ENCOUNTER — Encounter: Payer: Self-pay | Admitting: Family Medicine

## 2023-04-14 ENCOUNTER — Ambulatory Visit (INDEPENDENT_AMBULATORY_CARE_PROVIDER_SITE_OTHER): Payer: Medicare HMO | Admitting: Family Medicine

## 2023-04-14 VITALS — BP 138/77 | HR 81 | Temp 98.7°F | Ht 65.0 in | Wt 147.6 lb

## 2023-04-14 DIAGNOSIS — Z7985 Long-term (current) use of injectable non-insulin antidiabetic drugs: Secondary | ICD-10-CM | POA: Diagnosis not present

## 2023-04-14 DIAGNOSIS — I152 Hypertension secondary to endocrine disorders: Secondary | ICD-10-CM

## 2023-04-14 DIAGNOSIS — F5101 Primary insomnia: Secondary | ICD-10-CM

## 2023-04-14 DIAGNOSIS — E1159 Type 2 diabetes mellitus with other circulatory complications: Secondary | ICD-10-CM

## 2023-04-14 DIAGNOSIS — E1169 Type 2 diabetes mellitus with other specified complication: Secondary | ICD-10-CM | POA: Diagnosis not present

## 2023-04-14 DIAGNOSIS — E785 Hyperlipidemia, unspecified: Secondary | ICD-10-CM | POA: Diagnosis not present

## 2023-04-14 DIAGNOSIS — E1142 Type 2 diabetes mellitus with diabetic polyneuropathy: Secondary | ICD-10-CM

## 2023-04-14 DIAGNOSIS — G2581 Restless legs syndrome: Secondary | ICD-10-CM | POA: Diagnosis not present

## 2023-04-14 DIAGNOSIS — E119 Type 2 diabetes mellitus without complications: Secondary | ICD-10-CM

## 2023-04-14 DIAGNOSIS — M810 Age-related osteoporosis without current pathological fracture: Secondary | ICD-10-CM

## 2023-04-14 LAB — BAYER DCA HB A1C WAIVED: HB A1C (BAYER DCA - WAIVED): 6.8 % — ABNORMAL HIGH (ref 4.8–5.6)

## 2023-04-14 LAB — LIPID PANEL

## 2023-04-14 MED ORDER — PRAMIPEXOLE DIHYDROCHLORIDE 0.25 MG PO TABS: ORAL_TABLET | ORAL | 3 refills | Status: AC

## 2023-04-14 MED ORDER — ZOLPIDEM TARTRATE 5 MG PO TABS
ORAL_TABLET | ORAL | 5 refills | Status: DC
Start: 2023-05-05 — End: 2023-09-01

## 2023-04-14 MED ORDER — ATORVASTATIN CALCIUM 40 MG PO TABS: ORAL_TABLET | ORAL | 3 refills | Status: AC

## 2023-04-14 MED ORDER — ALENDRONATE SODIUM 70 MG PO TABS: 70.0000 mg | ORAL_TABLET | ORAL | 3 refills | Status: AC

## 2023-04-14 MED ORDER — METFORMIN HCL ER 500 MG PO TB24
1000.0000 mg | ORAL_TABLET | Freq: Every day | ORAL | 3 refills | Status: DC
Start: 2023-04-14 — End: 2023-11-18

## 2023-04-14 MED ORDER — LISINOPRIL 10 MG PO TABS: ORAL_TABLET | ORAL | 3 refills | Status: AC

## 2023-04-14 MED ORDER — METOPROLOL TARTRATE 25 MG PO TABS: 25.0000 mg | ORAL_TABLET | Freq: Two times a day (BID) | ORAL | 3 refills | Status: AC

## 2023-04-14 NOTE — Progress Notes (Signed)
 Subjective: CC:DM PCP: [name], DO IEP:PIRJJO H Vaccaro is a 72 y.o. female presenting to clinic today for:  1. Type 2 Diabetes with hypertension, hyperlipidemia and polyneuropathy:  She reports compliance with all medications except she skipped semaglutide for the last 3 weeks because she accidentally was over injecting this medication and caused some hypersensitivity of her skin.  She did have to use glipizide once since her last visit.  Diabetes Health Maintenance Due  Topic Date Due   HEMOGLOBIN A1C  07/07/2023   OPHTHALMOLOGY EXAM  09/03/2023   FOOT EXAM  01/06/2024    Last A1c:  Lab Results  Component Value Date   HGBA1C 6.7 (H) 01/06/2023    ROS: She denies any chest pain, shortness of breath, change in exercise tolerance.  ROS: Per HPI  Allergies  Allergen Reactions   Tape Rash   Past Medical History:  Diagnosis Date   DDD (degenerative disc disease), lumbar    Diabetes mellitus without complication (HCC)    GERD (gastroesophageal reflux disease)    Hyperlipidemia    Hypertension    Urge incontinence of urine     Current Outpatient Medications:    alendronate (FOSAMAX) 70 MG tablet, Take 1 tablet (70 mg total) by mouth every 7 (seven) days. Take with a full glass of water on an empty stomach., Disp: 12 tablet, Rfl: 3   Alpha-Lipoic Acid 600 MG CAPS, Take 1 capsule (600 mg total) by mouth daily. For diabetic neuropathy, Disp: 90 capsule, Rfl: 3   aspirin EC 81 MG tablet, Take 81 mg by mouth in the morning and at bedtime. Swallow whole., Disp: , Rfl:    atorvastatin (LIPITOR) 40 MG tablet, TAKE 1 TABLET BY MOUTH EVERYDAY AT BEDTIME, Disp: 90 tablet, Rfl: 0   benzonatate (TESSALON) 200 MG capsule, Take 1 capsule (200 mg total) by mouth 3 (three) times daily as needed. (Patient not taking: Reported on 03/30/2023), Disp: 60 capsule, Rfl: 1   Blood Glucose Monitoring Suppl (ONE TOUCH ULTRA 2) w/Device KIT, UAD to check BGs E11.9, Disp: 1 kit, Rfl: 0    gabapentin (NEURONTIN) 300 MG capsule, Take 1 capsule (300 mg total) by mouth 3 (three) times daily., Disp: 90 capsule, Rfl: 3   glipiZIDE (GLUCOTROL XL) 5 MG 24 hr tablet, TAKE 1 TABLET (5 MG TOTAL) BY MOUTH DAILY WITH BREAKFAST. FOR FASTING SUGAR >200. (Patient not taking: Reported on 03/30/2023), Disp: 90 tablet, Rfl: 0   HYDROcodone-acetaminophen (NORCO/VICODIN) 5-325 MG tablet, Take 1 tablet by mouth 2 (two) times daily as needed for moderate pain (pain score 4-6)., Disp: 10 tablet, Rfl: 0   ibuprofen (ADVIL) 800 MG tablet, Take 800 mg by mouth every 8 (eight) hours as needed for moderate pain. (Patient not taking: Reported on 03/30/2023), Disp: , Rfl:    lisinopril (ZESTRIL) 10 MG tablet, TAKE 1 TABLET (10 MG TOTAL) BY MOUTH IN THE MORNING AND AT BEDTIME. (STOP TAKING LISINOPRIL 20MG ), Disp: 180 tablet, Rfl: 0   metFORMIN (GLUCOPHAGE-XR) 500 MG 24 hr tablet, TAKE 2 TABLETS BY MOUTH EVERY DAY WITH BREAKFAST, Disp: 180 tablet, Rfl: 0   metoprolol tartrate (LOPRESSOR) 25 MG tablet, TAKE 1 TABLET BY MOUTH TWICE A DAY, Disp: 180 tablet, Rfl: 0   ondansetron (ZOFRAN) 4 MG tablet, Take 1 tablet (4 mg total) by mouth every 8 (eight) hours as needed for nausea or vomiting. (Patient not taking: Reported on 03/30/2023), Disp: 20 tablet, Rfl: 0   OneTouch Delica Lancets 30G MISC, Check BS BID Dx  E11.40, Disp: 200 each, Rfl: 3   ONETOUCH ULTRA test strip, CHECK BLOOD SUGAR TWICE DAILY DX 11.40, Disp: 200 strip, Rfl: 4   pramipexole (MIRAPEX) 0.25 MG tablet, TAKE 1 TABLET 2 HOURS BEFORE BEDTIME FOR RESTLESS LEGS, Disp: 90 tablet, Rfl: 0   Semaglutide, 1 MG/DOSE, 4 MG/3ML SOPN, Inject 1 mg as directed every 7 (seven) days., Disp: 9 mL, Rfl: 3   TURMERIC PO, Take 1 capsule by mouth 2 (two) times daily. , Disp: , Rfl:    valACYclovir (VALTREX) 1000 MG tablet, TAKE 1 TABLET (1,000 MG TOTAL) BY MOUTH 3 TIMES A DAY FOR 10 DAYS, Disp: 30 tablet, Rfl: 2   zolpidem (AMBIEN) 5 MG tablet, take 1 tablet(5 mg total) by mouth at  bedtime as needed for sleep., Disp: 30 tablet, Rfl: 5 Social History   Socioeconomic History   Marital status: Married    Spouse name: Jimmy   Number of children: 2   Years of education: Not on file   Highest education level: Not on file  Occupational History   Occupation: retired  Tobacco Use   Smoking status: Never   Smokeless tobacco: Never  Vaping Use   Vaping status: Never Used  Substance and Sexual Activity   Alcohol use: No   Drug use: No   Sexual activity: Not on file  Other Topics Concern   Not on file  Social History Narrative   2 children  - son lives in [place], daughter 15 minutes away.   09/09/21 - She is having to stay home more to take care of husband who is not in good health   Social Drivers of Health   Financial Resource Strain: Low Risk  (05/21/2022)   Overall Financial Resource Strain (CARDIA)    Difficulty of Paying Living Expenses: Not hard at all  Food Insecurity: No Food Insecurity (05/21/2022)   Hunger Vital Sign    Worried About Running Out of Food in the Last Year: Never true    Ran Out of Food in the Last Year: Never true  Transportation Needs: No Transportation Needs (05/21/2022)   PRAPARE - Administrator, Civil Service (Medical): No    Lack of Transportation (Non-Medical): No  Physical Activity: Insufficiently Active (05/21/2022)   Exercise Vital Sign    Days of Exercise per Week: 3 days    Minutes of Exercise per Session: 30 min  Stress: No Stress Concern Present (05/21/2022)   [employer] of Occupational Health - Occupational Stress Questionnaire    Feeling of Stress : Not at all  Social Connections: Moderately Integrated (12/03/2022)   Social Connection and Isolation Panel [NHANES]    Frequency of Communication with Friends and Family: More than three times a week    Frequency of Social Gatherings with Friends and Family: More than three times a week    Attends Religious Services: More than 4 times per year    Active  Member of [employer] or Organizations: No    Attends Banker Meetings: Never    Marital Status: Married  Catering manager Violence: Not At Risk (05/21/2022)   Humiliation, Afraid, Rape, and Kick questionnaire    Fear of Current or Ex-Partner: No    Emotionally Abused: No    Physically Abused: No    Sexually Abused: No   Family History  Problem Relation Age of Onset   Cancer Mother        lung cancer   Cancer Father  lymphnodes   Diabetes Sister    Lung cancer Sister    Diabetes Brother    Cancer Maternal Grandfather    Heart disease Paternal Grandfather    Diabetes Son    Cancer Maternal Aunt        throat   Cancer Paternal Uncle        unknown    Cancer Other        breast   Colon cancer Neg Hx     Objective: Office vital signs reviewed. BP 138/77   Pulse 81   Temp 98.7 F (37.1 C)   Ht 5\' 5"  (1.651 m)   SpO2 98%   BMI 25.79 kg/m   Physical Examination:  General: Awake, alert, well nourished, No acute distress HEENT: Sclera white.  Moist mucous membranes Cardio: regular rate and rhythm, S1S2 heard, no murmurs appreciated Pulm: clear to auscultation bilaterally, no wheezes, rhonchi or rales; normal work of breathing on room air Neuro: no tremor     04/14/2023    8:36 AM 02/23/2023   11:26 AM 01/06/2023    8:26 AM  Depression screen PHQ 2/9  Decreased Interest 0 0 0  Down, Depressed, Hopeless 0 0 0  PHQ - 2 Score 0 0 0  Altered sleeping 0 0 0  Tired, decreased energy 0 0 0  Change in appetite 0 0 0  Feeling bad or failure about yourself  0 0 0  Trouble concentrating 0 0 0  Moving slowly or fidgety/restless 0 0 0  Suicidal thoughts 0 0 0  PHQ-9 Score 0 0 0  Difficult doing work/chores Not difficult at all Not difficult at all Not difficult at all      04/14/2023    8:36 AM 02/23/2023   11:26 AM 01/06/2023    8:26 AM 11/25/2022    8:18 AM  GAD 7 : Generalized Anxiety Score  Nervous, Anxious, on Edge 0 0 0 0  Control/stop worrying 0 0 0  0  Worry too much - different things 0 0 0 0  Trouble relaxing 0 0 0 0  Restless 0 0 0 0  Easily annoyed or irritable 0 0 0 0  Afraid - awful might happen 0 0 0 0  Total GAD 7 Score 0 0 0 0  Anxiety Difficulty Not difficult at all Not difficult at all Not difficult at all Not difficult at all    Assessment/ Plan: 73 y.o. female   Diabetes mellitus treated with injections of non-insulin medication (HCC) - Plan: metFORMIN (GLUCOPHAGE-XR) 500 MG 24 hr tablet  Diabetic polyneuropathy associated with type 2 diabetes mellitus (HCC) - Plan: Bayer DCA Hb A1c Waived, CMP14+EGFR  Hypertension associated with diabetes (HCC) - Plan: Lipid Panel, CMP14+EGFR, lisinopril (ZESTRIL) 10 MG tablet, metoprolol tartrate (LOPRESSOR) 25 MG tablet  Hyperlipidemia associated with type 2 diabetes mellitus (HCC) - Plan: Lipid Panel, CMP14+EGFR, atorvastatin (LIPITOR) 40 MG tablet  Age-related osteoporosis without current pathological fracture - Plan: alendronate (FOSAMAX) 70 MG tablet  Restless leg syndrome, familial, uncontrolled - Plan: pramipexole (MIRAPEX) 0.25 MG tablet  Primary insomnia - Plan: ToxASSURE Select 13 (MW), Urine, zolpidem (AMBIEN) 5 MG tablet, Drug Screen 10 W/Conf, Se  Discussed resuming the Ozempic at 0.5 mg in a couple of weeks then can increase to 1 mg thereafter.  Metformin renewed  Renal function collected.  Continue ACE inhibitor, statin.  Blood pressure controlled.  No changes needed  Fosamax renewed.  Not due for vitamin D testing  Did not discuss restless  leg but Mirapex needs renewal  UDS and CSA were updated as per office policy.  Insomnia is chronic and stable.  National narcotic database reviewed and there were no red flags.  Ambien renewed for 6 months  May follow-up in 4 months, sooner if concerns arise  [name], DO [facility] [phone]

## 2023-04-15 ENCOUNTER — Encounter: Payer: Self-pay | Admitting: Family Medicine

## 2023-04-15 LAB — CMP14+EGFR
ALT: 25 IU/L (ref 0–32)
AST: 18 IU/L (ref 0–40)
Albumin: 4.3 g/dL (ref 3.8–4.8)
Alkaline Phosphatase: 85 IU/L (ref 44–121)
BUN/Creatinine Ratio: 17 (ref 12–28)
BUN: 15 mg/dL (ref 8–27)
Bilirubin Total: 0.9 mg/dL (ref 0.0–1.2)
CO2: 22 mmol/L (ref 20–29)
Calcium: 9.8 mg/dL (ref 8.7–10.3)
Chloride: 102 mmol/L (ref 96–106)
Creatinine, Ser: 0.9 mg/dL (ref 0.57–1.00)
Globulin, Total: 2.2 g/dL (ref 1.5–4.5)
Glucose: 136 mg/dL — ABNORMAL HIGH (ref 70–99)
Potassium: 4.7 mmol/L (ref 3.5–5.2)
Sodium: 139 mmol/L (ref 134–144)
Total Protein: 6.5 g/dL (ref 6.0–8.5)
eGFR: 68 mL/min/{1.73_m2} (ref >59)

## 2023-04-15 LAB — LIPID PANEL
Cholesterol, Total: 124 mg/dL (ref 100–199)
HDL: 26 mg/dL — ABNORMAL LOW (ref >39)
LDL CALC COMMENT:: 4.8 ratio — ABNORMAL HIGH (ref 0.0–4.4)
LDL Chol Calc (NIH): 59 mg/dL (ref 0–99)
Triglycerides: 244 mg/dL — ABNORMAL HIGH (ref 0–149)
VLDL Cholesterol Cal: 39 mg/dL (ref 5–40)

## 2023-04-16 LAB — DRUG SCREEN 10 W/CONF, SERUM
Amphetamines, IA: NEGATIVE ng/mL
Barbiturates, IA: NEGATIVE ug/mL
Benzodiazepines, IA: NEGATIVE ng/mL
Cocaine & Metabolite, IA: NEGATIVE ng/mL
Methadone, IA: NEGATIVE ng/mL
Opiates, IA: NEGATIVE ng/mL
Oxycodones, IA: NEGATIVE ng/mL
Phencyclidine, IA: NEGATIVE ng/mL
Propoxyphene, IA: NEGATIVE ng/mL
THC(Marijuana) Metabolite, IA: NEGATIVE ng/mL

## 2023-04-27 ENCOUNTER — Other Ambulatory Visit: Payer: Self-pay | Admitting: *Deleted

## 2023-04-27 NOTE — Patient Outreach (Signed)
 Care Management   Visit Note  04/27/2023 Name: Candace Lopez MRN: [id number] DOB: 01-20-1951  Subjective: Candace Lopez is a 73 y.o. year old female who is a primary care patient of [name], DO. The Care Management team was consulted for assistance.      Engaged with patient spoke with patient by telephone.    Goals Addressed             This Visit's Progress    RNCM Care Management Expected: Monitor, Self-Manage & Reduce Symptoms of: DM, HTN, HLD       Current Barriers:  Knowledge Deficits related to plan of care for management of HTN, HLD, and DMII  Chronic Disease Management support and education needs related to HTN, HLD, and DMII   RNCM Clinical Goal(s):  Patient will verbalize basic understanding of  HTN, HLD, and DMII disease process and self health management plan as evidenced by verbal explanation, recognizing symptoms, lifestyle modification take all medications exactly as prescribed and will call provider for medication related questions as evidenced by compliance with all medications attend all scheduled medical appointments: with primary care provider and specialist as evidenced by keeping all scheduled appointments demonstrate Improved and Ongoing adherence to prescribed treatment plan for HTN, HLD, and DMII as evidenced by consistent medication compliance, symptom monitoring, continued lifestyle modifications continue to work with RN Care Manager to address care management and care coordination needs related to  HTN, HLD, and DMII as evidenced by adherence to CM Team Scheduled appointments through collaboration with RN Care manager, provider, and care team.   Interventions: Evaluation of current treatment plan related to  self management and patient's adherence to plan as established by provider   Diabetes Interventions:  (Status:  Goal on track:  Yes.) Long Term Goal Assessed patient's understanding of A1c goal: <7% Provided education to patient about  basic DM disease process. A1C with slight increase from 6.7 to 6.8. Patient reporting that she is doing much better now that she has been educated by PharmD on proper administration of her Ozempic. She states she was advised to cut back to 0.5 mg x 2 weeks and then increase to 1 mg. She reports noncompliance with her diet. Reviewed medications with patient and discussed importance of medication adherence. Reports compliance with all medications Counseled on importance of regular laboratory monitoring as prescribed Discussed plans with patient for ongoing care management follow up and provided patient with direct contact information for care management team Provided patient with written educational materials related to hypo and hyperglycemia and importance of correct treatment. No reports of hypo/hyperglycemia. Reviewed scheduled/upcoming provider appointments including: 05-26-2023 for AWV Advised patient, providing education and rationale, to check cbg once daily, fasting and record, calling provider for findings outside established parameters. Reports fasting glucose this morning of 172. Lowest: 130 Highest: 172 Review of patient status, including review of consultants reports, relevant laboratory and other test results, and medications completed Screening for signs and symptoms of depression related to chronic disease state  Assessed social determinant of health barriers Lab Results  Component Value Date   HGBA1C 6.8 (H) 04/14/2023    Hyperlipidemia Interventions:  (Status:  Goal on track:  Yes.) Long Term Goal Medication review performed; medication list updated in electronic medical record.  Provider established cholesterol goals reviewed Counseled on importance of regular laboratory monitoring as prescribed Provided HLD educational materials Reviewed role and benefits of statin for ASCVD risk reduction. Reports compliance with Lipitor Discussed strategies to manage statin-induced myalgias.  Denies myalgias Reviewed importance of limiting foods high in cholesterol. Patient reports not following any specific diet. Reviewed exercise goals and target of 150 minutes per week Screening for signs and symptoms of depression related to chronic disease state Assessed social determinant of health barriers  Lab Results  Component Value Date   CHOL 124 04/14/2023   HDL 26 (L) 04/14/2023   LDLCALC 59 04/14/2023   LDLDIRECT 67 12/13/2019   TRIG 244 (H) 04/14/2023   CHOLHDL 4.8 (H) 04/14/2023     Hypertension Interventions:  (Status:  Goal on track:  Yes.) Long Term Goal Last practice recorded BP readings:  BP Readings from Last 3 Encounters:  04/14/23 138/77  03/25/23 108/60  03/08/23 127/73   Most recent eGFR/CrCl:  Lab Results  Component Value Date   EGFR 68 04/14/2023    No components found for: "CRCL"  Evaluation of current treatment plan related to hypertension self management and patient's adherence to plan as established by provider. Regularly checks BP.  Reports BP being in the 90/60s from Wednesday-Friday. She states on Wednesday she was not feeling well overall. This morning she reports BP of 113/93 repeat BP 102/77. Upon medication reconcillation with patient, she is taking Lisinopril & Metoprolol twice daily. Patient advised to check BP twice daily and record and RNCM will send message to PCP to request for parameters to be put in place. Provided education to patient re: stroke prevention, s/s of heart attack and stroke. Education and support provided. Reviewed medications with patient and discussed importance of compliance. Reports compliance with all medications Counseled on adverse effects of illicit drug and excessive alcohol use in patients with high blood pressure. Denies illicit drug or excessive alcohol use Counseled on the importance of exercise goals with target of 150 minutes per week Discussed plans with patient for ongoing care management follow up and  provided patient with direct contact information for care management team Advised patient, providing education and rationale, to monitor blood pressure daily and record, calling PCP for findings outside established parameters. RNCM reviewed with patient goal SBP<140 DBP <90. Reviewed scheduled/upcoming provider appointments including: 05-26-2023  for AWV Advised patient to discuss elevated or low BP readings with provider Provided education on prescribed diet Heart Healthy, ADA Discussed complications of poorly controlled blood pressure such as heart disease, stroke, circulatory complications, vision complications, kidney impairment, sexual dysfunction Screening for signs and symptoms of depression related to chronic disease state  Assessed social determinant of health barriers   Patient Goals/Self-Care Activities: Take all medications as prescribed Attend all scheduled provider appointments Call pharmacy for medication refills 3-7 days in advance of running out of medications Attend church or other social activities Perform all self care activities independently  Perform IADL's (shopping, preparing meals, housekeeping, managing finances) independently Call provider office for new concerns or questions  schedule appointment with eye doctor check blood sugar at prescribed times: once daily check feet daily for cuts, sores or redness enter blood sugar readings and medication or insulin into daily log  Follow Up Plan:  Telephone follow up appointment with care management team member scheduled for:  05-28-2023 at 9:00 am           Consent to Services:  Patient was given information about care management services, agreed to services, and gave verbal consent to participate.   Plan: Telephone follow up appointment with care management team member scheduled for:05-28-2023 at 9:00 am  Danise Edge, BSN RN [facility]  [facility] RN Care  Insurance underwriter Dial:  [phone]  Fax: [phone]

## 2023-04-27 NOTE — Patient Instructions (Signed)
 Visit Information  Thank you for taking time to visit with me today. Please don't hesitate to contact me if I can be of assistance to you before our next scheduled telephone appointment.  Following are the goals we discussed today:   Goals Addressed             This Visit's Progress    RNCM Care Management Expected: Monitor, Self-Manage & Reduce Symptoms of: DM, HTN, HLD       Current Barriers:  Knowledge Deficits related to plan of care for management of HTN, HLD, and DMII  Chronic Disease Management support and education needs related to HTN, HLD, and DMII   RNCM Clinical Goal(s):  Patient will verbalize basic understanding of  HTN, HLD, and DMII disease process and self health management plan as evidenced by verbal explanation, recognizing symptoms, lifestyle modification take all medications exactly as prescribed and will call provider for medication related questions as evidenced by compliance with all medications attend all scheduled medical appointments: with primary care provider and specialist as evidenced by keeping all scheduled appointments demonstrate Improved and Ongoing adherence to prescribed treatment plan for HTN, HLD, and DMII as evidenced by consistent medication compliance, symptom monitoring, continued lifestyle modifications continue to work with RN Care Manager to address care management and care coordination needs related to  HTN, HLD, and DMII as evidenced by adherence to CM Team Scheduled appointments through collaboration with RN Care manager, provider, and care team.   Interventions: Evaluation of current treatment plan related to  self management and patient's adherence to plan as established by provider   Diabetes Interventions:  (Status:  Goal on track:  Yes.) Long Term Goal Assessed patient's understanding of A1c goal: <7% Provided education to patient about basic DM disease process. A1C with slight increase from 6.7 to 6.8. Patient reporting that she is  doing much better now that she has been educated by PharmD on proper administration of her Ozempic. She states she was advised to cut back to 0.5 mg x 2 weeks and then increase to 1 mg. She reports noncompliance with her diet. Reviewed medications with patient and discussed importance of medication adherence. Reports compliance with all medications Counseled on importance of regular laboratory monitoring as prescribed Discussed plans with patient for ongoing care management follow up and provided patient with direct contact information for care management team Provided patient with written educational materials related to hypo and hyperglycemia and importance of correct treatment. No reports of hypo/hyperglycemia. Reviewed scheduled/upcoming provider appointments including: 05-26-2023 for AWV Advised patient, providing education and rationale, to check cbg once daily, fasting and record, calling provider for findings outside established parameters. Reports fasting glucose this morning of 172. Lowest: 130 Highest: 172 Review of patient status, including review of consultants reports, relevant laboratory and other test results, and medications completed Screening for signs and symptoms of depression related to chronic disease state  Assessed social determinant of health barriers Lab Results  Component Value Date   HGBA1C 6.8 (H) 04/14/2023    Hyperlipidemia Interventions:  (Status:  Goal on track:  Yes.) Long Term Goal Medication review performed; medication list updated in electronic medical record.  Provider established cholesterol goals reviewed Counseled on importance of regular laboratory monitoring as prescribed Provided HLD educational materials Reviewed role and benefits of statin for ASCVD risk reduction. Reports compliance with Lipitor Discussed strategies to manage statin-induced myalgias. Denies myalgias Reviewed importance of limiting foods high in cholesterol. Patient reports not  following any specific diet. Reviewed exercise  goals and target of 150 minutes per week Screening for signs and symptoms of depression related to chronic disease state Assessed social determinant of health barriers  Lab Results  Component Value Date   CHOL 124 04/14/2023   HDL 26 (L) 04/14/2023   LDLCALC 59 04/14/2023   LDLDIRECT 67 12/13/2019   TRIG 244 (H) 04/14/2023   CHOLHDL 4.8 (H) 04/14/2023     Hypertension Interventions:  (Status:  Goal on track:  Yes.) Long Term Goal Last practice recorded BP readings:  BP Readings from Last 3 Encounters:  04/14/23 138/77  03/25/23 108/60  03/08/23 127/73   Most recent eGFR/CrCl:  Lab Results  Component Value Date   EGFR 68 04/14/2023    No components found for: "CRCL"  Evaluation of current treatment plan related to hypertension self management and patient's adherence to plan as established by provider. Regularly checks BP.  Reports BP being in the 90/60s from Wednesday-Friday. She states on Wednesday she was not feeling well overall. This morning she reports BP of 113/93 repeat BP 102/77. Upon medication reconcillation with patient, she is taking Lisinopril & Metoprolol twice daily. Patient advised to check BP twice daily and record and RNCM will send message to PCP to request for parameters to be put in place. Provided education to patient re: stroke prevention, s/s of heart attack and stroke. Education and support provided. Reviewed medications with patient and discussed importance of compliance. Reports compliance with all medications Counseled on adverse effects of illicit drug and excessive alcohol use in patients with high blood pressure. Denies illicit drug or excessive alcohol use Counseled on the importance of exercise goals with target of 150 minutes per week Discussed plans with patient for ongoing care management follow up and provided patient with direct contact information for care management team Advised patient, providing  education and rationale, to monitor blood pressure daily and record, calling PCP for findings outside established parameters. RNCM reviewed with patient goal SBP<140 DBP <90. Reviewed scheduled/upcoming provider appointments including: 05-26-2023  for AWV Advised patient to discuss elevated or low BP readings with provider Provided education on prescribed diet Heart Healthy, ADA Discussed complications of poorly controlled blood pressure such as heart disease, stroke, circulatory complications, vision complications, kidney impairment, sexual dysfunction Screening for signs and symptoms of depression related to chronic disease state  Assessed social determinant of health barriers   Patient Goals/Self-Care Activities: Take all medications as prescribed Attend all scheduled provider appointments Call pharmacy for medication refills 3-7 days in advance of running out of medications Attend church or other social activities Perform all self care activities independently  Perform IADL's (shopping, preparing meals, housekeeping, managing finances) independently Call provider office for new concerns or questions  schedule appointment with eye doctor check blood sugar at prescribed times: once daily check feet daily for cuts, sores or redness enter blood sugar readings and medication or insulin into daily log  Follow Up Plan:  Telephone follow up appointment with care management team member scheduled for:  05-28-2023 at 9:00 am           Our next appointment is by telephone on 05-28-2023 at 9:00 am  Please call the care guide team at [phone] if you need to cancel or reschedule your appointment.   If you are experiencing a Mental Health or Behavioral Health Crisis or need someone to talk to, please call the Suicide and Crisis Lifeline: 988 call the [place] National Suicide Prevention Lifeline: 720 670 2971 or TTY: (205)209-9737 TTY [phone]) to talk to  a trained counselor call 1-800-273-TALK  (toll free, 24 hour hotline)   Patient verbalizes understanding of instructions and care plan provided today and agrees to view in MyChart. Active MyChart status and patient understanding of how to access instructions and care plan via MyChart confirmed with patient.     Telephone follow up appointment with care management team member scheduled for:05-28-2023 at 9:00 am  Danise Edge, BSN RN [facility], [facility] RN Care Manager Direct Dial: [phone]  Fax: [phone]

## 2023-04-28 ENCOUNTER — Ambulatory Visit (INDEPENDENT_AMBULATORY_CARE_PROVIDER_SITE_OTHER)

## 2023-04-28 NOTE — Progress Notes (Signed)
 Patient is in office today for a nurse visit for Blood Pressure Check. Patient blood pressure was 131/67, Patient No chest pain. Checked with home machine 137/72. Patient states "when I get up in the morning my blood pressure is 90/60 and I feel really light headed. And sometimes during the day I get really dizzy and light headed."

## 2023-05-06 ENCOUNTER — Other Ambulatory Visit: Payer: Self-pay | Admitting: Family Medicine

## 2023-05-12 DIAGNOSIS — E119 Type 2 diabetes mellitus without complications: Secondary | ICD-10-CM | POA: Diagnosis not present

## 2023-05-12 DIAGNOSIS — H43813 Vitreous degeneration, bilateral: Secondary | ICD-10-CM | POA: Diagnosis not present

## 2023-05-12 DIAGNOSIS — H2513 Age-related nuclear cataract, bilateral: Secondary | ICD-10-CM | POA: Diagnosis not present

## 2023-05-12 LAB — HM DIABETES EYE EXAM

## 2023-05-26 ENCOUNTER — Ambulatory Visit: Payer: Medicare HMO

## 2023-05-26 VITALS — BP 138/77 | HR 81 | Ht 65.0 in | Wt 147.0 lb

## 2023-05-26 DIAGNOSIS — Z0000 Encounter for general adult medical examination without abnormal findings: Secondary | ICD-10-CM

## 2023-05-26 NOTE — Progress Notes (Signed)
 Subjective:   Candace Lopez is a 73 y.o. who presents for a Medicare Wellness preventive visit.  Visit Complete: Virtual I connected with  Candace Lopez on 05/26/23 by a audio enabled telemedicine application and verified that I am speaking with the correct person using two identifiers.  Patient Location: Home  Provider Location: Home Office  I discussed the limitations of evaluation and management by telemedicine. The patient expressed understanding and agreed to proceed.  Vital Signs: Because this visit was a virtual/telehealth visit, some criteria may be missing or patient reported. Any vitals not documented were not able to be obtained and vitals that have been documented are patient reported.  VideoDeclined- This patient declined Librarian, academic. Therefore the visit was completed with audio only.  Persons Participating in Visit: Patient.  AWV Questionnaire: No: Patient Medicare AWV questionnaire was not completed prior to this visit.  Cardiac Risk Factors include: advanced age (>17men, >65 women);diabetes mellitus;hypertension     Objective:    Today's Vitals   05/26/23 1101  BP: 138/77  Pulse: 81  Weight: 147 lb (66.7 kg)  Height: 5\' 5"  (1.651 m)  PainSc: 1    Body mass index is 24.46 kg/m.     05/26/2023   11:08 AM 12/10/2022   10:28 AM 05/21/2022    1:14 PM 04/11/2022    9:58 AM 09/09/2021    1:38 PM 09/06/2020    2:35 PM 09/22/2018    9:28 AM  Advanced Directives  Does Patient Have a Medical Advance Directive? No No No No No No No  Would patient like information on creating a medical advance directive?   No - Patient declined Yes (MAU/Ambulatory/Procedural Areas - Information given) No - Patient declined No - Patient declined No - Patient declined    Current Medications (verified) Outpatient Encounter Medications as of 05/26/2023  Medication Sig   alendronate  (FOSAMAX ) 70 MG tablet Take 1 tablet (70 mg total) by mouth every 7  (seven) days. Take with a full glass of water on an empty stomach.   Alpha-Lipoic Acid 600 MG CAPS Take 1 capsule (600 mg total) by mouth daily. For diabetic neuropathy   aspirin  EC 81 MG tablet Take 81 mg by mouth in the morning and at bedtime. Swallow whole.   atorvastatin  (LIPITOR) 40 MG tablet TAKE 1 TABLET BY MOUTH EVERYDAY AT BEDTIME   benzonatate  (TESSALON ) 200 MG capsule Take 1 capsule (200 mg total) by mouth 3 (three) times daily as needed.   Blood Glucose Monitoring Suppl (ONE TOUCH ULTRA 2) w/Device KIT UAD to check BGs E11.9   gabapentin  (NEURONTIN ) 300 MG capsule Take 1 capsule (300 mg total) by mouth 3 (three) times daily.   glipiZIDE  (GLUCOTROL  XL) 5 MG 24 hr tablet TAKE 1 TABLET (5 MG TOTAL) BY MOUTH DAILY WITH BREAKFAST. FOR FASTING SUGAR >200.   HYDROcodone -acetaminophen  (NORCO/VICODIN) 5-325 MG tablet Take 1 tablet by mouth 2 (two) times daily as needed for moderate pain (pain score 4-6).   ibuprofen (ADVIL) 800 MG tablet Take 800 mg by mouth every 8 (eight) hours as needed for moderate pain (pain score 4-6).   lisinopril  (ZESTRIL ) 10 MG tablet TAKE 1 TABLET (10 MG TOTAL) BY MOUTH IN THE MORNING AND AT BEDTIME. (STOP TAKING LISINOPRIL  20MG )   metFORMIN  (GLUCOPHAGE -XR) 500 MG 24 hr tablet Take 2 tablets (1,000 mg total) by mouth daily with breakfast.   metoprolol  tartrate (LOPRESSOR ) 25 MG tablet Take 1 tablet (25 mg total) by mouth 2 (two)  times daily.   ondansetron  (ZOFRAN ) 4 MG tablet Take 1 tablet (4 mg total) by mouth every 8 (eight) hours as needed for nausea or vomiting.   OneTouch Delica Lancets 30G MISC Check BS BID Dx E11.40   ONETOUCH ULTRA test strip CHECK BLOOD SUGAR TWICE DAILY DX 11.40   pramipexole  (MIRAPEX ) 0.25 MG tablet Take 1 tablet 2 hours before bedtime for restless legs   Semaglutide , 1 MG/DOSE, 4 MG/3ML SOPN Inject 1 mg as directed every 7 (seven) days.   TURMERIC PO Take 1 capsule by mouth 2 (two) times daily.    zolpidem  (AMBIEN ) 5 MG tablet take 1  tablet(5 mg total) by mouth at bedtime as needed for sleep.   No facility-administered encounter medications on file as of 05/26/2023.    Allergies (verified) Tape   History: Past Medical History:  Diagnosis Date   DDD (degenerative disc disease), lumbar    Diabetes mellitus without complication (HCC)    GERD (gastroesophageal reflux disease)    Hyperlipidemia    Hypertension    Urge incontinence of urine    Past Surgical History:  Procedure Laterality Date   ABDOMINAL HYSTERECTOMY     arm surgery     COLONOSCOPY N/A 09/22/2018   Procedure: COLONOSCOPY;  Surgeon: Ruby Corporal, MD;  Location: [facility];  Service: Endoscopy;  Laterality: N/A;  830   FOOT SURGERY     KNEE SURGERY     Family History  Problem Relation Age of Onset   Cancer Mother        lung cancer   Cancer Father        lymphnodes   Diabetes Sister    Lung cancer Sister    Diabetes Brother    Cancer Maternal Grandfather    Heart disease Paternal Grandfather    Diabetes Son    Cancer Maternal Aunt        throat   Cancer Paternal Uncle        unknown    Cancer Other        breast   Colon cancer Neg Hx    Social History   Socioeconomic History   Marital status: Married    Spouse name: Jimmy   Number of children: 2   Years of education: Not on file   Highest education level: Not on file  Occupational History   Occupation: retired  Tobacco Use   Smoking status: Never   Smokeless tobacco: Never  Vaping Use   Vaping status: Never Used  Substance and Sexual Activity   Alcohol use: No   Drug use: No   Sexual activity: Not on file  Other Topics Concern   Not on file  Social History Narrative   2 children  - son lives in [place], daughter 15 minutes away.   09/09/21 - She is having to stay home more to take care of husband who is not in good health   Social Drivers of Health   Financial Resource Strain: Low Risk  (05/26/2023)   Overall Financial Resource Strain (CARDIA)     Difficulty of Paying Living Expenses: Not hard at all  Food Insecurity: No Food Insecurity (05/26/2023)   Hunger Vital Sign    Worried About Running Out of Food in the Last Year: Never true    Ran Out of Food in the Last Year: Never true  Transportation Needs: No Transportation Needs (05/26/2023)   PRAPARE - Administrator, Civil Service (Medical): No  Lack of Transportation (Non-Medical): No  Physical Activity: Patient Declined (05/26/2023)   Exercise Vital Sign    Days of Exercise per Week: Patient declined    Minutes of Exercise per Session: Patient declined  Stress: No Stress Concern Present (05/26/2023)   [employer] of Occupational Health - Occupational Stress Questionnaire    Feeling of Stress : Not at all  Social Connections: Moderately Isolated (05/26/2023)   Social Connection and Isolation Panel [NHANES]    Frequency of Communication with Friends and Family: More than three times a week    Frequency of Social Gatherings with Friends and Family: More than three times a week    Attends Religious Services: Never    Database administrator or Organizations: No    Attends Engineer, structural: Never    Marital Status: Married    Tobacco Counseling Counseling given: Yes    Clinical Intake:  Pre-visit preparation completed: Yes  Pain : 0-10 (aching pain in her r-arm started 4days ago) Pain Score: 1  Pain Type: Acute pain, Other (Comment) (aching pain in her r-arm started 4days ago) Pain Orientation: Right Pain Descriptors / Indicators: Aching Pain Onset: In the past 7 days     BMI - recorded: 24.46 Nutritional Status: BMI of 19-24  Normal Nutritional Risks: None Diabetes: Yes  Lab Results  Component Value Date   HGBA1C 6.8 (H) 04/14/2023   HGBA1C 6.7 (H) 01/06/2023   HGBA1C 6.4 (H) 07/07/2022     How often do you need to have someone help you when you read instructions, pamphlets, or other written materials from your doctor or  pharmacy?: 1 - Never  Interpreter Needed?: No  Information entered by :: Alia T/cma   Activities of Daily Living     05/26/2023   11:08 AM  In your present state of health, do you have any difficulty performing the following activities:  Hearing? 1  Vision? 0  Comment pt goes to [facility] [address] in [place], [place]  Difficulty concentrating or making decisions? 0  Walking or climbing stairs? 0  Dressing or bathing? 0  Doing errands, shopping? 0  Preparing Food and eating ? N  Using the Toilet? N  In the past six months, have you accidently leaked urine? N  Do you have problems with loss of bowel control? N  Managing your Medications? N  Managing your Finances? N  Housekeeping or managing your Housekeeping? N    Patient Care Team: Eliodoro Guerin, DO as PCP - General (Family Medicine) Hyland Mailman, MD as Referring Physician (Optometry) Alpheus Arvin Donata Fryer, [facility] as Pharmacist (Family Medicine) Alexia Idler, OD (Optometry) Remona Carmel, RN as [facility] Care Management (General Practice) Remona Carmel, RN  Indicate any recent Medical Services you may have received from other than [facility] providers in the past year (date may be approximate).     Assessment:   This is a routine wellness examination for Tawan.  Hearing/Vision screen Hearing Screening - Comments:: Pt stated have dif w/hearing Vision Screening - Comments:: Pt denies vision, goes [facility] in [place]   Goals Addressed             This Visit's Progress    Patient Stated       Pt wants to start walking again       Depression Screen     05/26/2023   11:08 AM 04/14/2023    8:36 AM 02/23/2023   11:26 AM 01/06/2023    8:26 AM  11/25/2022    8:17 AM 11/05/2022    1:54 PM 07/07/2022   10:53 AM  PHQ 2/9 Scores  PHQ - 2 Score 0 0 0 0 0 0 0  PHQ- 9 Score 1 0 0 0 0 0 0    Fall Risk     05/26/2023   11:06 AM 04/27/2023    9:08 AM 04/14/2023    8:37 AM 01/06/2023    8:26 AM 11/25/2022    8:17 AM  Fall  Risk   Falls in the past year? 0 0 0 0 0  Number falls in past yr: 0 0 0 0   Injury with Fall? 0 0 0 0 0  Risk for fall due to : Impaired balance/gait;Impaired mobility No Fall Risks No Fall Risks No Fall Risks No Fall Risks  Follow up Falls prevention discussed;Falls evaluation completed Falls evaluation completed Falls evaluation completed Education provided Falls evaluation completed;Education provided    MEDICARE RISK AT HOME:  Medicare Risk at Home Any stairs in or around the home?: No If so, are there any without handrails?: No Home free of loose throw rugs in walkways, pet beds, electrical cords, etc?: Yes Adequate lighting in your home to reduce risk of falls?: Yes Life alert?: No Use of a cane, walker or w/c?: No Grab bars in the bathroom?: No Shower chair or bench in shower?: Yes Elevated toilet seat or a handicapped toilet?: No  TIMED UP AND GO:  Was the test performed?  no  Cognitive Function: 6CIT completed    08/26/2017    2:07 PM  MMSE - Mini Mental State Exam  Orientation to time 5  Orientation to Place 5  Registration 3  Attention/ Calculation 5  Recall 2  Language- name 2 objects 2  Language- repeat 1  Language- follow 3 step command 3  Language- read & follow direction 1  Write a sentence 1  Copy design 1  Total score 29        05/26/2023   11:18 AM 05/21/2022    1:13 PM 09/09/2021    1:42 PM 09/06/2020    2:29 PM  6CIT Screen  What Year? 0 points 0 points 0 points 0 points  What month? 0 points 0 points 0 points 0 points  What time? 0 points 0 points 0 points 0 points  Count back from 20 0 points 0 points 0 points 0 points  Months in reverse 0 points 0 points 0 points 2 points  Repeat phrase 0 points 0 points 2 points 4 points  Total Score 0 points 0 points 2 points 6 points    Immunizations Immunization History  Administered Date(s) Administered   Fluad Quad(high Dose 65+) 10/07/2018, 10/31/2019, 11/05/2020, 12/03/2021   Fluad  Trivalent(High Dose 65+) 11/05/2022   Influenza, High Dose Seasonal PF 12/29/2016, 10/21/2017   Moderna Covid-19 Vaccine Bivalent Booster 95yrs & up 03/07/2021   Moderna Sars-Covid-2 Vaccination 04/26/2019, 05/24/2019, 01/24/2020   Pneumococcal Conjugate-13 08/26/2017   Pneumococcal Polysaccharide-23 12/13/2019   Tdap 07/16/2012    Screening Tests Health Maintenance  Topic Date Due   Zoster Vaccines- Shingrix (1 of 2) 07/15/2023 (Originally 09/01/1969)   DTaP/Tdap/Td (2 - Td or Tdap) 01/06/2024 (Originally 07/17/2022)   COVID-19 Vaccine (5 - 2024-25 season) 06/10/2024 (Originally 09/28/2022)   INFLUENZA VACCINE  08/28/2023   HEMOGLOBIN A1C  10/15/2023   Diabetic kidney evaluation - Urine ACR  01/06/2024   FOOT EXAM  01/06/2024   Diabetic kidney evaluation - eGFR measurement  04/13/2024  OPHTHALMOLOGY EXAM  05/11/2024   Medicare Annual Wellness (AWV)  05/25/2024   DEXA SCAN  07/10/2024   MAMMOGRAM  10/14/2024   Colonoscopy  09/21/2028   Pneumonia Vaccine 30+ Years old  Completed   Hepatitis C Screening  Completed   HPV VACCINES  Aged Out   Meningococcal B Vaccine  Aged Out    Health Maintenance  There are no preventive care reminders to display for this patient.  Health Maintenance Items Addressed: See Nurse Notes  Additional Screening:  Vision Screening: Recommended annual ophthalmology exams for early detection of glaucoma and other disorders of the eye.  Dental Screening: Recommended annual dental exams for proper oral hygiene  Community Resource Referral / Chronic Care Management: CRR required this visit?  No   CCM required this visit?  No     Plan:     I have personally reviewed and noted the following in the patient's chart:   Medical and social history Use of alcohol, tobacco or illicit drugs  Current medications and supplements including opioid prescriptions. Patient is not currently taking opioid prescriptions. Functional ability and status Nutritional  status Physical activity Advanced directives List of other physicians Hospitalizations, surgeries, and ER visits in previous 12 months Vitals Screenings to include cognitive, depression, and falls Referrals and appointments  In addition, I have reviewed and discussed with patient certain preventive protocols, quality metrics, and best practice recommendations. A written personalized care plan for preventive services as well as general preventive health recommendations were provided to patient.     Michaelle Adolphus, CMA   05/26/2023   After Visit Summary: (MyChart) Due to this being a telephonic visit, the after visit summary with patients personalized plan was offered to patient via MyChart   Notes: Clinician Recommendations: Please discuss with your provider about getting the shingles vaccine at your next visit.

## 2023-05-26 NOTE — Patient Instructions (Signed)
 Ms. Candace Lopez , Thank you for taking time to come for your Medicare Wellness Visit. I appreciate your ongoing commitment to your health goals. Please review the following plan we discussed and let me know if I can assist you in the future.   Referrals/Orders/Follow-Ups/Clinician Recommendations: Please discuss with your provider about getting the shingles vaccine at your next visit.   This is a list of the screening recommended for you and due dates:  Health Maintenance  Topic Date Due   Zoster (Shingles) Vaccine (1 of 2) 07/15/2023*   DTaP/Tdap/Td vaccine (2 - Td or Tdap) 01/06/2024*   COVID-19 Vaccine (5 - 2024-25 season) 06/10/2024*   Flu Shot  08/28/2023   Hemoglobin A1C  10/15/2023   Yearly kidney health urinalysis for diabetes  01/06/2024   Complete foot exam   01/06/2024   Yearly kidney function blood test for diabetes  04/13/2024   Eye exam for diabetics  05/11/2024   Medicare Annual Wellness Visit  05/25/2024   DEXA scan (bone density measurement)  07/10/2024   Mammogram  10/14/2024   Colon Cancer Screening  09/21/2028   Pneumonia Vaccine  Completed   Hepatitis C Screening  Completed   HPV Vaccine  Aged Out   Meningitis B Vaccine  Aged Out  *Topic was postponed. The date shown is not the original due date.    Advanced directives: (Declined) Advance directive discussed with you today. Even though you declined this today, please call our office should you change your mind, and we can give you the proper paperwork for you to fill out.  Next Medicare Annual Wellness Visit scheduled for next year: Yes

## 2023-05-27 ENCOUNTER — Encounter: Payer: Self-pay | Admitting: *Deleted

## 2023-05-28 ENCOUNTER — Other Ambulatory Visit: Payer: Self-pay | Admitting: *Deleted

## 2023-05-28 ENCOUNTER — Other Ambulatory Visit: Payer: Self-pay

## 2023-05-28 NOTE — Patient Outreach (Signed)
 Complex Care Management   Visit Note  05/28/2023  Name:  Candace Lopez MRN: [id number] DOB: [birth year]  Situation: Referral received for Complex Care Management related to Diabetes with Complications I obtained verbal consent from Patient.  Visit completed with patient  on the phone  Background:   Past Medical History:  Diagnosis Date   DDD (degenerative disc disease), lumbar    Diabetes mellitus without complication (HCC)    GERD (gastroesophageal reflux disease)    Hyperlipidemia    Hypertension    Urge incontinence of urine     Assessment: Patient Reported Symptoms:  Cognitive Cognitive Status: Alert and oriented to person, place, and time, Insightful and able to interpret abstract concepts, Normal speech and language skills Cognitive/Intellectual Conditions Management [RPT]: None reported or documented in medical history or problem list   Health Maintenance Behaviors: Annual physical exam Healing Pattern: Average Health Facilitated by: Rest  Neurological Neurological Review of Symptoms: Hearing changes Neurological Management Strategies: Routine screening Neurological Self-Management Outcome: 4 (good)  HEENT HEENT Symptoms Reported: No symptoms reported HEENT Self-Management Outcome: 4 (good)    Cardiovascular Cardiovascular Symptoms Reported: No symptoms reported Does patient have uncontrolled Hypertension?: No Cardiovascular Conditions: Hypertension Cardiovascular Management Strategies: Medication therapy, Routine screening Cardiovascular Self-Management Outcome: 4 (good)  Respiratory Respiratory Symptoms Reported: No symptoms reported Respiratory Conditions: Seasonal allergies Respiratory Self-Management Outcome: 4 (good)  Endocrine Patient reports the following symptoms related to hypoglycemia or hyperglycemia : No symptoms reported Is patient diabetic?: Yes Is patient checking blood sugars at home?: Yes Endocrine Conditions: Diabetes Endocrine Management  Strategies: Routine screening, Medication therapy Endocrine Self-Management Outcome: 4 (good)  Gastrointestinal Gastrointestinal Symptoms Reported: No symptoms reported Gastrointestinal Self-Management Outcome: 4 (good) Nutrition Risk Screen (CP): No indicators present  Genitourinary Genitourinary Symptoms Reported: No symptoms reported Genitourinary Self-Management Outcome: 4 (good)  Integumentary Integumentary Symptoms Reported: No symptoms reported Skin Self-Management Outcome: 4 (good)  Musculoskeletal Musculoskelatal Symptoms Reviewed: No symptoms reported Musculoskeletal Self-Management Outcome: 4 (good) Falls in the past year?: No Number of falls in past year: 1 or less Was there an injury with Fall?: No Fall Risk Category Calculator: 0 Patient Fall Risk Level: Low Fall Risk Patient at Risk for Falls Due to: No Fall Risks Fall risk Follow up: Falls evaluation completed  Psychosocial Psychosocial Symptoms Reported: No symptoms reported Behavioral Health Self-Management Outcome: 4 (good) Major Change/Loss/Stressor/Fears (CP): Denies Techniques to Cope with Loss/Stress/Change: None Quality of Family Relationships: helpful, involved, supportive Do you feel physically threatened by others?: No      05/28/2023    4:06 PM  Depression screen PHQ 2/9  Decreased Interest 0  Down, Depressed, Hopeless 0  PHQ - 2 Score 0    There were no vitals filed for this visit.  Medications Reviewed Today     Reviewed by Remona Carmel, RN (Registered Nurse) on 05/28/23 at 1554  Med List Status: <None>   Medication Order Taking? Sig Documenting Provider Last Dose Status Informant  alendronate  (FOSAMAX ) 70 MG tablet [id number] Yes Take 1 tablet (70 mg total) by mouth every 7 (seven) days. Take with a full glass of water on an empty stomach. Eliodoro Guerin, DO Taking Active   Alpha-Lipoic Acid 600 MG CAPS [id number] Yes Take 1 capsule (600 mg total) by mouth daily. For diabetic neuropathy  Eliodoro Guerin, DO Taking Active            Med Note Wayne Haines, AMY E   Mon Sep 09, 2021  1:38 PM) Taking every other day  aspirin  EC 81 MG tablet [id number] Yes Take 81 mg by mouth in the morning and at bedtime. Swallow whole. [provider] Taking Active   atorvastatin  (LIPITOR) 40 MG tablet [id number] Yes TAKE 1 TABLET BY MOUTH EVERYDAY AT BEDTIME Vicky Grange M, DO Taking Active   benzonatate  (TESSALON ) 200 MG capsule [id number] No Take 1 capsule (200 mg total) by mouth 3 (three) times daily as needed.  Patient not taking: Reported on 05/28/2023   Yevette Hem, FNP Not Taking Consider Medication Status and Discontinue   Blood Glucose Monitoring Suppl (ONE TOUCH ULTRA 2) w/Device KIT [id number] Yes UAD to check BGs E11.9 Vicky Grange M, DO Taking Active   gabapentin  (NEURONTIN ) 300 MG capsule [id number] No Take 1 capsule (300 mg total) by mouth 3 (three) times daily.  Patient not taking: Reported on 05/28/2023   Eliodoro Guerin, DO Not Taking Consider Medication Status and Discontinue   glipiZIDE  (GLUCOTROL  XL) 5 MG 24 hr tablet [id number] Yes TAKE 1 TABLET (5 MG TOTAL) BY MOUTH DAILY WITH BREAKFAST. FOR FASTING SUGAR >200. Vicky Grange M, DO Taking Active   HYDROcodone -acetaminophen  (NORCO/VICODIN) 5-325 MG tablet [id number] No Take 1 tablet by mouth 2 (two) times daily as needed for moderate pain (pain score 4-6).  Patient not taking: Reported on 05/28/2023   Corbin Dess, PA-C Not Taking Consider Medication Status and Discontinue   ibuprofen (ADVIL) 800 MG tablet [id number] Yes Take 800 mg by mouth every 8 (eight) hours as needed for moderate pain (pain score 4-6). [provider] Taking Active   lisinopril  (ZESTRIL ) 10 MG tablet [id number] Yes TAKE 1 TABLET (10 MG TOTAL) BY MOUTH IN THE MORNING AND AT BEDTIME. (STOP TAKING LISINOPRIL  20MG ) Vicky Grange M, DO Taking Active   metFORMIN  (GLUCOPHAGE -XR) 500 MG 24 hr tablet [id number] Yes Take 2  tablets (1,000 mg total) by mouth daily with breakfast. Vicky Grange M, DO Taking Active   metoprolol  tartrate (LOPRESSOR ) 25 MG tablet [id number] Yes Take 1 tablet (25 mg total) by mouth 2 (two) times daily. Vicky Grange M, DO Taking Active   ondansetron  (ZOFRAN ) 4 MG tablet [id number] No Take 1 tablet (4 mg total) by mouth every 8 (eight) hours as needed for nausea or vomiting.  Patient not taking: Reported on 05/28/2023   Yevette Hem, FNP Not Taking Consider Medication Status and Discontinue   OneTouch Delica Lancets 30G MISC [id number] Yes Check BS BID Dx E11.40 Eliodoro Guerin, DO Taking Active   [facility] ULTRA test strip [id number] Yes CHECK BLOOD SUGAR TWICE DAILY DX 11.40 Vicky Grange M, DO Taking Active   pramipexole  (MIRAPEX ) 0.25 MG tablet [id number] Yes Take 1 tablet 2 hours before bedtime for restless legs Vicky Grange M, DO Taking Active   Semaglutide , 1 MG/DOSE, 4 MG/3ML SOPN [id number] Yes Inject 1 mg as directed every 7 (seven) days. Eliodoro Guerin, DO Taking Active            Med Note Alpheus Arvin, JULIE D   Thu Apr 09, 2023  8:51 AM) Via [employer] patient assistance program    TURMERIC PO [id number] Yes Take 1 capsule by mouth 2 (two) times daily.  [provider] Taking Active Self  zolpidem  (AMBIEN ) 5 MG tablet [id number] Yes take 1 tablet(5 mg total) by mouth at bedtime as needed for sleep. Eliodoro Guerin, DO Taking Active             Recommendation:   PCP Follow-up  Follow Up Plan:   Telephone  follow up appointment date/time:  06-29-2023 at 10:30 am  Grandville Lax, BSN RN [facility], [facility] RN Care Manager Direct Dial: [phone]  Fax: [phone]

## 2023-05-28 NOTE — Patient Instructions (Signed)
 Visit Information  Thank you for taking time to visit with me today. Please don't hesitate to contact me if I can be of assistance to you before our next scheduled appointment.  Your next care management appointment is by telephone on 06-29-2023 at 10:30 am  Telephone follow-up in 1 month  Please call the care guide team at [phone] if you need to cancel, schedule, or reschedule an appointment.   Please call the Suicide and Crisis Lifeline: 988 call the [place]  National Suicide Prevention Lifeline: 419-449-5709 or TTY: (647)277-9259 TTY 623-342-8179) to talk to a trained counselor call 1-800-273-TALK (toll free, 24 hour hotline) call the [facility]: [phone] call 911 if you are experiencing a Mental Health or Behavioral Health Crisis or need someone to talk to.  Grandville Lax, BSN RN [facility], [facility] RN Care Manager Direct Dial: [phone]  Fax: [phone]

## 2023-05-31 ENCOUNTER — Other Ambulatory Visit: Payer: Self-pay | Admitting: Family Medicine

## 2023-05-31 DIAGNOSIS — E1140 Type 2 diabetes mellitus with diabetic neuropathy, unspecified: Secondary | ICD-10-CM

## 2023-06-18 ENCOUNTER — Other Ambulatory Visit: Payer: Self-pay | Admitting: Family Medicine

## 2023-06-18 DIAGNOSIS — R112 Nausea with vomiting, unspecified: Secondary | ICD-10-CM

## 2023-06-29 ENCOUNTER — Other Ambulatory Visit: Payer: Self-pay | Admitting: *Deleted

## 2023-06-29 NOTE — Patient Outreach (Signed)
 Complex Care Management   Visit Note  06/29/2023  Name:  Candace Lopez MRN: [id number] DOB: 13-Jul-1950  Situation: Referral received for Complex Care Management related to Diabetes with Complications and HLD, HTN I obtained verbal consent from Patient.  Visit completed with patient  on the phone  Background:   Past Medical History:  Diagnosis Date   DDD (degenerative disc disease), lumbar    Diabetes mellitus without complication (HCC)    GERD (gastroesophageal reflux disease)    Hyperlipidemia    Hypertension    Urge incontinence of urine     Assessment: Patient Reported Symptoms:  Cognitive Cognitive Status: Alert and oriented to person, place, and time, Able to follow simple commands, Insightful and able to interpret abstract concepts, Normal speech and language skills Cognitive/Intellectual Conditions Management [RPT]: None reported or documented in medical history or problem list   Health Maintenance Behaviors: Annual physical exam Healing Pattern: Average Health Facilitated by: Stress management, Rest  Neurological Neurological Review of Symptoms: No symptoms reported    HEENT HEENT Symptoms Reported: No symptoms reported      Cardiovascular Cardiovascular Symptoms Reported: No symptoms reported Does patient have uncontrolled Hypertension?: No Cardiovascular Conditions: High blood cholesterol, Hypertension Cardiovascular Management Strategies: Medication therapy  Respiratory Respiratory Symptoms Reported: No symptoms reported    Endocrine Patient reports the following symptoms related to hypoglycemia or hyperglycemia : No symptoms reported Is patient diabetic?: Yes Is patient checking blood sugars at home?: Yes Endocrine Conditions: Diabetes Endocrine Management Strategies: Medication therapy, Routine screening  Gastrointestinal Gastrointestinal Symptoms Reported: No symptoms reported   Nutrition Risk Screen (CP): No indicators present  Genitourinary Genitourinary  Symptoms Reported: No symptoms reported    Integumentary Integumentary Symptoms Reported: No symptoms reported    Musculoskeletal Musculoskelatal Symptoms Reviewed: No symptoms reported   Falls in the past year?: No Number of falls in past year: 1 or less Was there an injury with Fall?: No Fall Risk Category Calculator: 0 Patient Fall Risk Level: Low Fall Risk Patient at Risk for Falls Due to: No Fall Risks Fall risk Follow up: Falls evaluation completed  Psychosocial Psychosocial Symptoms Reported: No symptoms reported     Quality of Family Relationships: helpful, involved, supportive Do you feel physically threatened by others?: No      06/29/2023   10:50 AM  Depression screen PHQ 2/9  Decreased Interest 0  Down, Depressed, Hopeless 0  PHQ - 2 Score 0    There were no vitals filed for this visit.  Medications Reviewed Today     Reviewed by Remona Carmel, RN (Registered Nurse) on 06/29/23 at 1046  Med List Status: <None>   Medication Order Taking? Sig Documenting Provider Last Dose Status Informant  alendronate  (FOSAMAX ) 70 MG tablet [id number] No Take 1 tablet (70 mg total) by mouth every 7 (seven) days. Take with a full glass of water on an empty stomach. Vicky Grange M, DO Taking Active   Alpha-Lipoic Acid 600 MG CAPS [id number] No Take 1 capsule (600 mg total) by mouth daily. For diabetic neuropathy Eliodoro Guerin, DO Taking Active            Med Note Wayne Haines, AMY E   Mon Sep 09, 2021  1:38 PM) Taking every other day  aspirin  EC 81 MG tablet [id number] No Take 81 mg by mouth in the morning and at bedtime. Swallow whole. [provider] Taking Active   atorvastatin  (LIPITOR) 40 MG tablet [id number] No TAKE 1 TABLET BY MOUTH EVERYDAY AT  BEDTIME Vicky Grange M, DO Taking Active   benzonatate  (TESSALON ) 200 MG capsule [id number] No Take 1 capsule (200 mg total) by mouth 3 (three) times daily as needed.  Patient not taking: Reported on 05/28/2023    Yevette Hem, FNP Not Taking Active   Blood Glucose Monitoring Suppl (ONE TOUCH ULTRA 2) w/Device KIT [id number] No UAD to check BGs E11.9 Vicky Grange M, DO Taking Active   gabapentin  (NEURONTIN ) 300 MG capsule [id number] No Take 1 capsule (300 mg total) by mouth 3 (three) times daily.  Patient not taking: Reported on 05/28/2023   Eliodoro Guerin, DO Not Taking Active   glipiZIDE  (GLUCOTROL  XL) 5 MG 24 hr tablet [id number]  TAKE 1 TABLET (5 MG TOTAL) BY MOUTH DAILY WITH BREAKFAST. FOR FASTING SUGAR >200. Vicky Grange M, DO  Active   HYDROcodone -acetaminophen  (NORCO/VICODIN) 5-325 MG tablet [id number] No Take 1 tablet by mouth 2 (two) times daily as needed for moderate pain (pain score 4-6).  Patient not taking: Reported on 05/28/2023   Corbin Dess, PA-C Not Taking Active   ibuprofen (ADVIL) 800 MG tablet [id number] No Take 800 mg by mouth every 8 (eight) hours as needed for moderate pain (pain score 4-6). [provider] Taking Active   lisinopril  (ZESTRIL ) 10 MG tablet [id number] No TAKE 1 TABLET (10 MG TOTAL) BY MOUTH IN THE MORNING AND AT BEDTIME. (STOP TAKING LISINOPRIL  20MG ) Vicky Grange M, DO Taking Active   metFORMIN  (GLUCOPHAGE -XR) 500 MG 24 hr tablet [id number] No Take 2 tablets (1,000 mg total) by mouth daily with breakfast. Vicky Grange M, DO Taking Active   metoprolol  tartrate (LOPRESSOR ) 25 MG tablet [id number] No Take 1 tablet (25 mg total) by mouth 2 (two) times daily. Vicky Grange M, DO Taking Active   ondansetron  (ZOFRAN ) 4 MG tablet [id number] No Take 1 tablet (4 mg total) by mouth every 8 (eight) hours as needed for nausea or vomiting.  Patient not taking: Reported on 05/28/2023   Yevette Hem, FNP Not Taking Active   OneTouch Delica Lancets 30G MISC [id number] No Check BS BID Dx E11.40 Eliodoro Guerin, DO Taking Active   [facility] ULTRA test strip [id number] No CHECK BLOOD SUGAR TWICE DAILY DX 11.40 Vicky Grange M, DO Taking Active    pramipexole  (MIRAPEX ) 0.25 MG tablet [id number] No Take 1 tablet 2 hours before bedtime for restless legs Vicky Grange M, DO Taking Active   Semaglutide , 1 MG/DOSE, 4 MG/3ML SOPN [id number] No Inject 1 mg as directed every 7 (seven) days. Eliodoro Guerin, DO Taking Active            Med Note Alpheus Arvin, JULIE D   Thu Apr 09, 2023  8:51 AM) Via [employer] patient assistance program    TURMERIC PO [id number] No Take 1 capsule by mouth 2 (two) times daily.  [provider] Taking Active Self  zolpidem  (AMBIEN ) 5 MG tablet [id number] No take 1 tablet(5 mg total) by mouth at bedtime as needed for sleep. Eliodoro Guerin, DO Taking Active             Recommendation:   Continue Current Plan of Care  Follow Up Plan:   Telephone follow up appointment date/time:  07-27-2023 at 10:30 am  Grandville Lax, BSN RN [facility], [facility] RN Care Manager Direct Dial: [phone]  Fax: [phone]

## 2023-06-29 NOTE — Patient Instructions (Addendum)
 Visit Information  Thank you for taking time to visit with me today. Please don't hesitate to contact me if I can be of assistance to you before our next scheduled appointment.  Your next care management appointment is by telephone on 07-27-2023 at 10:30 am  Telephone follow-up in 1 month  Please call the care guide team at [phone] if you need to cancel, schedule, or reschedule an appointment.   Please call the Suicide and Crisis Lifeline: 988 call the [place]  National Suicide Prevention Lifeline: 304-091-0833 or TTY: 6261030550 TTY (956) 641-1324) to talk to a trained counselor call 1-800-273-TALK (toll free, 24 hour hotline) call the [facility]: [phone] call 911 if you are experiencing a Mental Health or Behavioral Health Crisis or need someone to talk to.  Grandville Lax, BSN RN [facility], [facility] Health RN Care Manager Direct Dial: [phone]  Fax: [phone]    High Triglycerides Eating Plan Triglycerides are a type of fat in the blood. High levels of triglycerides can increase your risk of heart disease and stroke. If your triglyceride levels are high, choosing the right foods can help lower your triglycerides and keep your heart healthy. Work with your health care provider or a dietitian to develop an eating plan that is right for you. What are tips for following this plan? General guidelines  Lose weight, if you are overweight. For most people, losing 5-10 lb (2-5 kg) helps lower triglyceride levels. A weight-loss plan may include: 30 minutes of exercise at least 5 days a week. Reducing the amount of calories, sugar, and fat you eat. Eat a wide variety of fresh fruits, vegetables, and whole grains. These foods are high in fiber. Eat foods that contain healthy fats, such as fatty fish, nuts, seeds, and olive oil. Avoid foods that are high in added sugar, added salt (sodium), and saturated fat. Avoid low-fiber,  refined carbohydrates such as white bread, crackers, noodles, and white rice. Avoid foods with trans fats or partially hydrogenated oils, such as fried foods or stick margarine. If you drink alcohol: Limit how much you have to: 0-1 drink a day for women who are not pregnant. 0-2 drinks a day for men. Your health care provider may recommend that you drink less than these amounts depending on your overall health. Know how much alcohol is in a drink. In the [place] one drink equals one 12 oz bottle of beer (355 mL), one 5 oz glass of wine (148 mL), or one 1 oz glass of hard liquor (44 mL). Reading food labels Check food labels for: The amount of saturated fat. Choose foods with no or very little saturated fat (less than 2 g). The amount of trans fat. Choose foods with no transfat. The amount of cholesterol. Choose foods that are low in cholesterol. The amount of sodium. Choose foods with less than 140 milligrams (mg) per serving. Shopping Buy dairy products labeled as nonfat (skim) or low-fat (1%). Avoid buying processed or prepackaged foods. These are often high in added sugar, sodium, and fat. Cooking Choose healthy fats when cooking, such as olive oil, avocado oil, or canola oil. Cook foods using lower fat methods, such as baking, broiling, boiling, or grilling. Make your own sauces, dressings, and marinades when possible, instead of buying them. Store-bought sauces, dressings, and marinades are often high in sodium and sugar. Meal planning Eat more home-cooked food and less restaurant, buffet, and fast food. Eat fatty fish at least 2 times each week. Examples of  fatty fish include salmon, trout, sardines, mackerel, tuna, and herring. If you eat whole eggs, do not eat more than 4 egg yolks per week. What foods should I eat? Fruits All fresh, canned (in natural juice), or frozen fruits. Vegetables Fresh or frozen vegetables. Low-sodium canned vegetables. Grains Whole wheat or whole grain  breads, crackers, cereals, and pasta. Unsweetened oatmeal. Bulgur. Barley. Quinoa. Brown rice. Whole wheat flour tortillas. Meats and other proteins Skinless chicken or [place]. Ground chicken or [place]. Lean cuts of pork, trimmed of fat. Fish and seafood, especially salmon, trout, and herring. Egg whites. Dried beans, peas, or lentils. Unsalted nuts or seeds. Unsalted canned beans. Natural peanut or almond butter or other nut butters. Dairy Low-fat dairy products. Skim or low-fat (1%) milk. Reduced fat (2%) and low-sodium cheese. Low-fat ricotta cheese. Low-fat cottage cheese. Plain, low-fat yogurt. Fats and oils Tub margarine without trans fats. Light or reduced-fat mayonnaise. Light or reduced-fat salad dressings. Avocado. Safflower, olive, sunflower, soybean, and canola oils. The items listed above may not be a complete list of recommended foods and beverages. Talk with your dietitian about what dietary choices are best for you. What foods should I avoid? Fruits Sweetened dried fruit. Canned fruit in syrup. Fruit juice. Vegetables Creamed or fried vegetables. Vegetables in a cheese sauce. Grains White bread. White (regular) pasta. White rice. Cornbread. Bagels. Pastries. Crackers that contain trans fat. Meats and other proteins Fatty cuts of meat. Ribs. Chicken wings. Helene Loader. Sausage. Bologna. Salami. Chitterlings. Fatback. Hot dogs. Bratwurst. Packaged lunch meats. Dairy Whole or reduced-fat (2%) milk. Half-and-half. Cream cheese. Full-fat or sweetened yogurt. Full-fat cheese. Nondairy creamers. Whipped toppings. Processed cheese or cheese spreads. Cheese curds. Fats and oils Butter. Stick margarine. Lard. Shortening. Ghee. Bacon fat. Tropical oils, such as coconut, palm kernel, or palm oils. Beverages Alcohol. Sweetened drinks, such as soda, lemonade, fruit drinks, or punches. Sweets and desserts Corn syrup. Sugars. Honey. Molasses. Candy. Jam and jelly. Syrup. Sweetened cereals. Cookies.  Pies. Cakes. Donuts. Muffins. Ice cream. Condiments Store-bought sauces, dressings, and marinades that are high in sugar, such as ketchup and barbecue sauce. The items listed above may not be a complete list of foods and beverages you should avoid. Talk with your dietitian about what dietary choices are best for you. Summary High levels of triglycerides can increase the risk of heart disease and stroke. Choosing the right foods can help lower your triglycerides. Eat plenty of fresh fruits, vegetables, and whole grains. Choose low-fat dairy and lean meats. Eat fatty fish at least twice a week. Avoid processed and prepackaged foods with added sugar, sodium, saturated fat, and trans fat. If you need suggestions or have questions about what types of food are good for you, talk with your health care provider or a dietitian. This information is not intended to replace advice given to you by your health care provider. Make sure you discuss any questions you have with your health care provider. Document Revised: 05/25/2020 Document Reviewed: 05/25/2020 Elsevier Patient Education  2024 [employer].

## 2023-07-01 ENCOUNTER — Other Ambulatory Visit: Payer: Self-pay | Admitting: Family Medicine

## 2023-07-01 DIAGNOSIS — F5101 Primary insomnia: Secondary | ICD-10-CM

## 2023-07-08 ENCOUNTER — Other Ambulatory Visit: Payer: Self-pay | Admitting: Family

## 2023-07-08 DIAGNOSIS — B9689 Other specified bacterial agents as the cause of diseases classified elsewhere: Secondary | ICD-10-CM

## 2023-07-13 ENCOUNTER — Telehealth: Payer: Self-pay | Admitting: Family Medicine

## 2023-07-13 ENCOUNTER — Other Ambulatory Visit: Payer: Self-pay | Admitting: Family Medicine

## 2023-07-13 DIAGNOSIS — B9689 Other specified bacterial agents as the cause of diseases classified elsewhere: Secondary | ICD-10-CM

## 2023-07-13 MED ORDER — BENZONATATE 200 MG PO CAPS
200.0000 mg | ORAL_CAPSULE | Freq: Three times a day (TID) | ORAL | 1 refills | Status: DC | PRN
Start: 2023-07-13 — End: 2023-09-01

## 2023-07-13 NOTE — Telephone Encounter (Signed)
 done

## 2023-07-13 NOTE — Telephone Encounter (Unsigned)
 Copied from CRM [id number]. Topic: Clinical - Prescription Issue >> Jul 13, 2023 12:29 PM Ivette P wrote: Reason for CRM: Pt called in because meds were refused, was told pt needed to make an appt.   Pt is scheduled for  09/01/2023, pt uses medication for sleep and cannot wait that long   Benzonatate  200 MG TAKE 1 CAPSULE BY MOUTH THREE TIMES A DAY AS NEEDED   Can prescription be sent in to hold pt over until appt.    Please follow up with pt. [phone] (M)

## 2023-07-17 ENCOUNTER — Telehealth: Payer: Self-pay

## 2023-07-27 ENCOUNTER — Other Ambulatory Visit: Payer: Self-pay | Admitting: Family Medicine

## 2023-07-27 ENCOUNTER — Other Ambulatory Visit: Payer: Self-pay | Admitting: *Deleted

## 2023-07-27 DIAGNOSIS — F5101 Primary insomnia: Secondary | ICD-10-CM

## 2023-07-27 NOTE — Patient Instructions (Signed)
 Visit Information  Thank you for taking time to visit with me today. Please don't hesitate to contact me if I can be of assistance to you before our next scheduled appointment.  Your next care management appointment is by telephone on 08-26-2023 at 10:30 am    Please call the care guide team at [phone] if you need to cancel, schedule, or reschedule an appointment.   Please call the Suicide and Crisis Lifeline: 988 call the [place]  National Suicide Prevention Lifeline: 619-531-5547 or TTY: 901 582 3759 TTY 540 085 1505) to talk to a trained counselor call 1-800-273-TALK (toll free, 24 hour hotline) call the [facility]: [phone] call 911 if you are experiencing a Mental Health or Behavioral Health Crisis or need someone to talk to.  Rosina Forte, BSN RN [facility], [facility] RN Care Manager Direct Dial: [phone]  Fax: [phone]

## 2023-07-27 NOTE — Patient Outreach (Signed)
 Complex Care Management   Visit Note  07/27/2023  Name:  Candace Lopez MRN: [id number] DOB: [birth year]  Situation: Referral received for Complex Care Management related to Diabetes with Complications I obtained verbal consent from Patient.  Visit completed with patient  on the phone  Background:   Past Medical History:  Diagnosis Date   DDD (degenerative disc disease), lumbar    Diabetes mellitus without complication (HCC)    GERD (gastroesophageal reflux disease)    Hyperlipidemia    Hypertension    Urge incontinence of urine     Assessment: Patient Reported Symptoms:  Cognitive Cognitive Status: Able to follow simple commands, Normal speech and language skills, Insightful and able to interpret abstract concepts Cognitive/Intellectual Conditions Management [RPT]: None reported or documented in medical history or problem list   Health Maintenance Behaviors: Annual physical exam Healing Pattern: Average Health Facilitated by: Rest  Neurological Neurological Review of Symptoms: No symptoms reported Neurological Management Strategies: Routine screening Neurological Self-Management Outcome: 4 (good)  HEENT HEENT Symptoms Reported: No symptoms reported HEENT Management Strategies: Routine screening HEENT Self-Management Outcome: 4 (good)    Cardiovascular Cardiovascular Symptoms Reported: No symptoms reported Does patient have uncontrolled Hypertension?: No Cardiovascular Management Strategies: Medication therapy, Routine screening Cardiovascular Self-Management Outcome: 4 (good)  Respiratory Respiratory Symptoms Reported: No symptoms reported Respiratory Management Strategies: Routine screening Respiratory Self-Management Outcome: 4 (good)  Endocrine Endocrine Symptoms Reported: No symptoms reported Is patient diabetic?: Yes Is patient checking blood sugars at home?: Yes List most recent blood sugar readings, include date and time of day: 07-27-2023 at 0900 136 Endocrine  Self-Management Outcome: 4 (good)  Gastrointestinal Gastrointestinal Symptoms Reported: No symptoms reported Gastrointestinal Self-Management Outcome: 4 (good) Nutrition Risk Screen (CP): No indicators present  Genitourinary Genitourinary Symptoms Reported: No symptoms reported Genitourinary Self-Management Outcome: 4 (good)  Integumentary Integumentary Symptoms Reported: No symptoms reported Skin Self-Management Outcome: 4 (good)  Musculoskeletal Musculoskelatal Symptoms Reviewed: No symptoms reported Musculoskeletal Self-Management Outcome: 4 (good) Falls in the past year?: No Number of falls in past year: 1 or less Was there an injury with Fall?: No Fall Risk Category Calculator: 0 Patient Fall Risk Level: Low Fall Risk Patient at Risk for Falls Due to: No Fall Risks Fall risk Follow up: Falls evaluation completed  Psychosocial Psychosocial Symptoms Reported: No symptoms reported Behavioral Health Self-Management Outcome: 4 (good) Major Change/Loss/Stressor/Fears (CP): Denies Techniques to Cope with Loss/Stress/Change: Not applicable Quality of Family Relationships: helpful, involved, supportive Do you feel physically threatened by others?: No      07/27/2023   11:03 AM  Depression screen PHQ 2/9  Decreased Interest 0  Down, Depressed, Hopeless 0  PHQ - 2 Score 0    Vitals:   07/27/23 1056  BP: 107/62    Medications Reviewed Today     Reviewed by Bertrum Rosina HERO, RN (Registered Nurse) on 07/27/23 at 1045  Med List Status: <None>   Medication Order Taking? Sig Documenting Provider Last Dose Status Informant  alendronate  (FOSAMAX ) 70 MG tablet [id number] Yes Take 1 tablet (70 mg total) by mouth every 7 (seven) days. Take with a full glass of water on an empty stomach. Jolinda Norene HERO, DO  Active   Alpha-Lipoic Acid 600 MG CAPS [id number] Yes Take 1 capsule (600 mg total) by mouth daily. For diabetic neuropathy Jolinda Norene HERO, DO  Active            Med Note  LAURALYN, AMY E   Mon Sep 09, 2021  1:38 PM) Taking every other  day  aspirin  EC 81 MG tablet [id number] Yes Take 81 mg by mouth in the morning and at bedtime. Swallow whole. [provider]  Active   atorvastatin  (LIPITOR) 40 MG tablet [id number] Yes TAKE 1 TABLET BY MOUTH EVERYDAY AT BEDTIME Jolinda Potter M, DO  Active   benzonatate  (TESSALON ) 200 MG capsule [id number] Yes Take 1 capsule (200 mg total) by mouth 3 (three) times daily as needed. Jolinda Potter M, DO  Active   Blood Glucose Monitoring Suppl (ONE TOUCH ULTRA 2) w/Device KIT [id number] Yes UAD to check BGs E11.9 Jolinda Potter M, DO  Active   gabapentin  (NEURONTIN ) 300 MG capsule [id number]  Take 1 capsule (300 mg total) by mouth 3 (three) times daily.  Patient not taking: Reported on 07/27/2023   Jolinda Potter M, DO  Consider Medication Status and Discontinue   glipiZIDE  (GLUCOTROL  XL) 5 MG 24 hr tablet [id number] Yes TAKE 1 TABLET (5 MG TOTAL) BY MOUTH DAILY WITH BREAKFAST. FOR FASTING SUGAR >200.  Patient taking differently: Take 5 mg by mouth daily as needed (For fasting sugar >200.). For fasting sugar >200.   Jolinda Potter M, DO  Active   HYDROcodone -acetaminophen  (NORCO/VICODIN) 5-325 MG tablet [id number]  Take 1 tablet by mouth 2 (two) times daily as needed for moderate pain (pain score 4-6).  Patient not taking: Reported on 07/27/2023   Stuart Vernell Norris, PA-C  Active   ibuprofen (ADVIL) 800 MG tablet [id number] Yes Take 800 mg by mouth every 8 (eight) hours as needed for moderate pain (pain score 4-6). [provider]  Active   lisinopril  (ZESTRIL ) 10 MG tablet [id number] Yes TAKE 1 TABLET (10 MG TOTAL) BY MOUTH IN THE MORNING AND AT BEDTIME. (STOP TAKING LISINOPRIL  20MG ) Jolinda Potter M, DO  Active   metFORMIN  (GLUCOPHAGE -XR) 500 MG 24 hr tablet [id number] Yes Take 2 tablets (1,000 mg total) by mouth daily with breakfast. Jolinda Potter M, DO  Active   metoprolol  tartrate (LOPRESSOR ) 25 MG  tablet [id number] Yes Take 1 tablet (25 mg total) by mouth 2 (two) times daily. Jolinda Potter M, DO  Active   ondansetron  (ZOFRAN ) 4 MG tablet [id number]  Take 1 tablet (4 mg total) by mouth every 8 (eight) hours as needed for nausea or vomiting.  Patient not taking: Reported on 07/27/2023   Lavell Bari LABOR, FNP  Active   OneTouch Delica Lancets 30G MISC [id number] Yes Check BS BID Dx E11.40 Jolinda Potter HERO, DO  Active   [facility] ULTRA test strip [id number] Yes CHECK BLOOD SUGAR TWICE DAILY DX 11.40 Jolinda Potter M, DO  Active   pramipexole  (MIRAPEX ) 0.25 MG tablet [id number] Yes Take 1 tablet 2 hours before bedtime for restless legs Jolinda Potter M, DO  Active   Semaglutide , 1 MG/DOSE, 4 MG/3ML SOPN [id number] Yes Inject 1 mg as directed every 7 (seven) days. Jolinda Potter HERO, DO  Active            Med Note TENA, JULIE D   Thu Apr 09, 2023  8:51 AM) Via [employer] patient assistance program    TURMERIC PO [id number] Yes Take 1 capsule by mouth 2 (two) times daily.  [provider]  Active Self  zolpidem  (AMBIEN ) 5 MG tablet [id number]  take 1 tablet(5 mg total) by mouth at bedtime as needed for sleep.  Patient not taking: Reported on 07/27/2023   Jolinda Potter HERO, DO  Active             Recommendation:   Continue  Current Plan of Care  Follow Up Plan:   Telephone follow-up in 1 month  Rosina Forte, BSN RN [facility], [facility] RN Care Manager Direct Dial: [phone]  Fax: [phone]

## 2023-08-25 ENCOUNTER — Other Ambulatory Visit: Payer: Self-pay | Admitting: Family Medicine

## 2023-08-25 DIAGNOSIS — E1140 Type 2 diabetes mellitus with diabetic neuropathy, unspecified: Secondary | ICD-10-CM

## 2023-08-26 ENCOUNTER — Other Ambulatory Visit: Payer: Self-pay | Admitting: *Deleted

## 2023-08-26 NOTE — Patient Instructions (Signed)
 Visit Information  Thank you for taking time to visit with me today. Please don't hesitate to contact me if I can be of assistance to you before our next scheduled appointment.  Your next care management appointment is by telephone on 09/25/2023 at 1030  Telephone follow-up in 1 month  Please call the care guide team at [phone] if you need to cancel, schedule, or reschedule an appointment.   Please call the Suicide and Crisis Lifeline: 988 call the [place]  National Suicide Prevention Lifeline: 726-533-2419 or TTY: 669-202-5218 TTY 831-106-5916) to talk to a trained counselor call 1-800-273-TALK (toll free, 24 hour hotline) call 911 if you are experiencing a Mental Health or Behavioral Health Crisis or need someone to talk to.  Hendricks Her RN, BSN  [place] [facility] RN Case Information systems manager [phone]

## 2023-08-26 NOTE — Patient Outreach (Signed)
 Complex Care Management   Visit Note  08/26/2023  Name:  Candace Lopez MRN: [id number] DOB: Jan 02, 1951  Situation: Referral received for Complex Care Management related to Diabetes with Complications and Hyperlipidemia and Hypertention I obtained verbal consent from Patient.  Visit completed with patient  on the phone  Background:   Past Medical History:  Diagnosis Date   DDD (degenerative disc disease), lumbar    Diabetes mellitus without complication (HCC)    GERD (gastroesophageal reflux disease)    Hyperlipidemia    Hypertension    Urge incontinence of urine     Assessment: Patient Reported Symptoms:  Cognitive Cognitive Status: Able to follow simple commands, Normal speech and language skills Cognitive/Intellectual Conditions Management [RPT]: None reported or documented in medical history or problem list   Health Maintenance Behaviors: Annual physical exam Healing Pattern: Average Health Facilitated by: Rest  Neurological Neurological Review of Symptoms: No symptoms reported Neurological Management Strategies: Routine screening Neurological Self-Management Outcome: 4 (good)  HEENT HEENT Symptoms Reported: No symptoms reported HEENT Management Strategies: Routine screening HEENT Self-Management Outcome: 4 (good)    Cardiovascular Cardiovascular Symptoms Reported: No symptoms reported Does patient have uncontrolled Hypertension?: No Cardiovascular Management Strategies: Medication therapy, Routine screening Cardiovascular Self-Management Outcome: 4 (good)  Respiratory Respiratory Symptoms Reported: No symptoms reported Respiratory Management Strategies: Routine screening Respiratory Self-Management Outcome: 4 (good)  Endocrine Endocrine Symptoms Reported: No symptoms reported Is patient diabetic?: Yes Is patient checking blood sugars at home?: Yes List most recent blood sugar readings, include date and time of day: 183 am Fasting Endocrine Self-Management Outcome: 4  (good)  Gastrointestinal Gastrointestinal Symptoms Reported: No symptoms reported Gastrointestinal Management Strategies: Coping strategies Gastrointestinal Self-Management Outcome: 4 (good) Nutrition Risk Screen (CP): No indicators present  Genitourinary Genitourinary Symptoms Reported: No symptoms reported Genitourinary Management Strategies: Coping strategies Genitourinary Self-Management Outcome: 4 (good)  Integumentary Integumentary Symptoms Reported: No symptoms reported Skin Management Strategies: Coping strategies Skin Self-Management Outcome: 4 (good)  Musculoskeletal Musculoskelatal Symptoms Reviewed: No symptoms reported Musculoskeletal Management Strategies: Routine screening, Coping strategies Musculoskeletal Self-Management Outcome: 4 (good) Falls in the past year?: No Number of falls in past year: 1 or less Was there an injury with Fall?: No Fall Risk Category Calculator: 0 Patient Fall Risk Level: Low Fall Risk Patient at Risk for Falls Due to: No Fall Risks Fall risk Follow up: Falls evaluation completed  Psychosocial Psychosocial Symptoms Reported: No symptoms reported Behavioral Management Strategies: Activity, Adequate rest Behavioral Health Self-Management Outcome: 4 (good) Major Change/Loss/Stressor/Fears (CP): Denies Techniques to Cope with Loss/Stress/Change: Not applicable Quality of Family Relationships: helpful, stressful, involved Do you feel physically threatened by others?: No      08/26/2023   11:00 AM  Depression screen PHQ 2/9  Decreased Interest 0  Down, Depressed, Hopeless 0  PHQ - 2 Score 0    Vitals:   08/26/23 1055  BP: 132/80    Medications Reviewed Today     Reviewed by Kay Hendricks MATSU, RN (Case Manager) on 08/26/23 at 1049  Med List Status: <None>   Medication Order Taking? Sig Documenting Provider Last Dose Status Informant  alendronate  (FOSAMAX ) 70 MG tablet [id number] Yes Take 1 tablet (70 mg total) by mouth every 7 (seven)  days. Take with a full glass of water on an empty stomach. Jolinda Norene HERO, DO  Active   Alpha-Lipoic Acid 600 MG CAPS [id number] Yes Take 1 capsule (600 mg total) by mouth daily. For diabetic neuropathy Jolinda Norene HERO, DO  Active  Med Note ADALBERTO, Nikolay Demetriou G   Wed Aug 26, 2023 10:40 AM)    aspirin  EC 81 MG tablet [id number] Yes Take 81 mg by mouth in the morning and at bedtime. Swallow whole. [provider]  Active   atorvastatin  (LIPITOR) 40 MG tablet [id number] Yes TAKE 1 TABLET BY MOUTH EVERYDAY AT BEDTIME Jolinda Potter M, DO  Active   benzonatate  (TESSALON ) 200 MG capsule [id number]  Take 1 capsule (200 mg total) by mouth 3 (three) times daily as needed.  Patient not taking: Reported on 08/26/2023   Jolinda Potter HERO, DO  Consider Medication Status and Discontinue   Blood Glucose Monitoring Suppl (ONE TOUCH ULTRA 2) w/Device KIT [id number] Yes UAD to check BGs E11.9 Jolinda Potter M, DO  Active   gabapentin  (NEURONTIN ) 300 MG capsule [id number]  Take 1 capsule (300 mg total) by mouth 3 (three) times daily.  Patient not taking: Reported on 08/26/2023   Jolinda Potter M, DO  Consider Medication Status and Discontinue   glipiZIDE  (GLUCOTROL  XL) 5 MG 24 hr tablet [id number] Yes TAKE 1 TABLET (5 MG TOTAL) BY MOUTH DAILY WITH BREAKFAST. FOR FASTING SUGAR >200. Jolinda Potter M, DO  Active   HYDROcodone -acetaminophen  (NORCO/VICODIN) 5-325 MG tablet [id number]  Take 1 tablet by mouth 2 (two) times daily as needed for moderate pain (pain score 4-6).  Patient not taking: Reported on 08/26/2023   Stuart Vernell Jyoti Harju, PA-C  Consider Medication Status and Discontinue   ibuprofen (ADVIL) 800 MG tablet [id number] Yes Take 800 mg by mouth every 8 (eight) hours as needed for moderate pain (pain score 4-6). [provider]  Active   lisinopril  (ZESTRIL ) 10 MG tablet [id number] Yes TAKE 1 TABLET (10 MG TOTAL) BY MOUTH IN THE MORNING AND AT BEDTIME. (STOP TAKING LISINOPRIL   20MG ) Jolinda, Ashly M, DO  Active   metFORMIN  (GLUCOPHAGE -XR) 500 MG 24 hr tablet [id number] Yes Take 2 tablets (1,000 mg total) by mouth daily with breakfast.  Patient taking differently: Take 500 mg by mouth 2 (two) times daily with a meal.   Jolinda Potter M, DO  Active   metoprolol  tartrate (LOPRESSOR ) 25 MG tablet [id number] Yes Take 1 tablet (25 mg total) by mouth 2 (two) times daily. Jolinda Potter M, DO  Active   ondansetron  (ZOFRAN ) 4 MG tablet [id number]  Take 1 tablet (4 mg total) by mouth every 8 (eight) hours as needed for nausea or vomiting.  Patient not taking: Reported on 08/26/2023   Lavell Bari LABOR, FNP  Consider Medication Status and Discontinue   OneTouch Delica Lancets 30G MISC [id number] Yes Check BS BID Dx E11.40 Jolinda Potter HERO, DO  Active   [facility] ULTRA test strip [id number] Yes CHECK BLOOD SUGAR TWICE DAILY DX 11.40 Jolinda Potter M, DO  Active   pramipexole  (MIRAPEX ) 0.25 MG tablet [id number] Yes Take 1 tablet 2 hours before bedtime for restless legs Jolinda Potter M, DO  Active   Semaglutide , 1 MG/DOSE, 4 MG/3ML SOPN [id number] Yes Inject 1 mg as directed every 7 (seven) days. Jolinda Potter HERO, DO  Active            Med Note TENA, JULIE D   Thu Apr 09, 2023  8:51 AM) Via [employer] patient assistance program    TURMERIC PO [id number]  Take 1 capsule by mouth 2 (two) times daily.   Patient not taking: Reported on 08/26/2023   [provider]  Consider Medication Status and Discontinue Self  zolpidem  (AMBIEN ) 5 MG tablet [id number] Yes  take 1 tablet(5 mg total) by mouth at bedtime as needed for sleep. Jolinda Norene HERO, DO  Active             Recommendation:   Continue Current Plan of Care  Follow Up Plan:   Telephone follow-up in 1 month  Hendricks Her RN, BSN  [place] [facility] RN Case Manager   Direct [phone]

## 2023-09-01 ENCOUNTER — Ambulatory Visit (INDEPENDENT_AMBULATORY_CARE_PROVIDER_SITE_OTHER): Admitting: Family Medicine

## 2023-09-01 ENCOUNTER — Encounter: Payer: Self-pay | Admitting: Family Medicine

## 2023-09-01 VITALS — BP 138/72 | HR 68 | Temp 97.3°F | Ht 65.0 in | Wt 150.0 lb

## 2023-09-01 DIAGNOSIS — Z7984 Long term (current) use of oral hypoglycemic drugs: Secondary | ICD-10-CM | POA: Diagnosis not present

## 2023-09-01 DIAGNOSIS — E1159 Type 2 diabetes mellitus with other circulatory complications: Secondary | ICD-10-CM

## 2023-09-01 DIAGNOSIS — E1169 Type 2 diabetes mellitus with other specified complication: Secondary | ICD-10-CM | POA: Diagnosis not present

## 2023-09-01 DIAGNOSIS — E1142 Type 2 diabetes mellitus with diabetic polyneuropathy: Secondary | ICD-10-CM | POA: Diagnosis not present

## 2023-09-01 DIAGNOSIS — I152 Hypertension secondary to endocrine disorders: Secondary | ICD-10-CM | POA: Diagnosis not present

## 2023-09-01 DIAGNOSIS — F5101 Primary insomnia: Secondary | ICD-10-CM

## 2023-09-01 DIAGNOSIS — E119 Type 2 diabetes mellitus without complications: Secondary | ICD-10-CM | POA: Diagnosis not present

## 2023-09-01 DIAGNOSIS — E785 Hyperlipidemia, unspecified: Secondary | ICD-10-CM

## 2023-09-01 LAB — BAYER DCA HB A1C WAIVED: HB A1C (BAYER DCA - WAIVED): 6.5 % — ABNORMAL HIGH (ref 4.8–5.6)

## 2023-09-01 MED ORDER — ZOLPIDEM TARTRATE 5 MG PO TABS: ORAL_TABLET | ORAL | 5 refills | Status: AC

## 2023-09-01 NOTE — Progress Notes (Signed)
 Subjective: CC:DM PCP: Jolinda Candace Lopez, Candace Lopez is a 73 y.o. female presenting to clinic today for:  1. Type 2 Diabetes with hypertension, hyperlipidemia w/ neuropathy:  She is currently doing fingerstick glucose checks.  She is compliant with her Ozempic , metformin , glipizide , Lipitor and lisinopril .  She is off of gabapentin .  She notes that her son encouraged her to use the horse medicine ivermectin to reduce her chances of cancer and cure her diabetes because this is what it did for him and for another family member that had stage IV cancer of some sort and was miraculously cured of it after taking this medicine.  She denies any known worms or infection.  She took it for about 2 weeks and then discontinued it because she was worried that it may cause problems long-term.  She does not report any hypoglycemic episodes.  Diabetes Health Maintenance Due  Topic Date Due   HEMOGLOBIN A1C  10/15/2023   FOOT EXAM  01/06/2024   OPHTHALMOLOGY EXAM  05/11/2024    Last A1c:  Lab Results  Component Value Date   HGBA1C 6.8 (H) 04/14/2023    ROS: No chest pain or shortness of breath  2.  Insomnia Patient reports that she went 2 weeks without her Ambien  because the pharmacist told her she had no more refills and she had to be seen in order to get refills.  However, when she talked a different pharmacist about a week and a half later she was told she did in fact have several refills leftover and she is not sure why she was told that.  She has been able to get her medication since that time but had already scheduled this appointment so figured she might as well come.  ROS: Per HPI  Allergies  Allergen Reactions   Tape Rash   Past Medical History:  Diagnosis Date   DDD (degenerative disc disease), lumbar    Diabetes mellitus without complication (HCC)    GERD (gastroesophageal reflux disease)    Hyperlipidemia    Hypertension    Urge incontinence of urine      Current Outpatient Medications:    alendronate  (FOSAMAX ) 70 MG tablet, Take 1 tablet (70 mg total) by mouth every 7 (seven) days. Take with a full glass of water on an empty stomach., Disp: 12 tablet, Rfl: 3   Alpha-Lipoic Acid 600 MG CAPS, Take 1 capsule (600 mg total) by mouth daily. For diabetic neuropathy, Disp: 90 capsule, Rfl: 3   aspirin  EC 81 MG tablet, Take 81 mg by mouth in the morning and at bedtime. Swallow whole., Disp: , Rfl:    atorvastatin  (LIPITOR) 40 MG tablet, TAKE 1 TABLET BY MOUTH EVERYDAY AT BEDTIME, Disp: 90 tablet, Rfl: 3   benzonatate  (TESSALON ) 200 MG capsule, Take 1 capsule (200 mg total) by mouth 3 (three) times daily as needed. (Patient not taking: Reported on 08/26/2023), Disp: 60 capsule, Rfl: 1   Blood Glucose Monitoring Suppl (ONE TOUCH ULTRA 2) w/Device KIT, UAD to check BGs E11.9, Disp: 1 kit, Rfl: 0   gabapentin  (NEURONTIN ) 300 MG capsule, Take 1 capsule (300 mg total) by mouth 3 (three) times daily. (Patient not taking: Reported on 08/26/2023), Disp: 90 capsule, Rfl: 3   glipiZIDE  (GLUCOTROL  XL) 5 MG 24 hr tablet, TAKE 1 TABLET (5 MG TOTAL) BY MOUTH DAILY WITH BREAKFAST. FOR FASTING SUGAR >200., Disp: 90 tablet, Rfl: 0   HYDROcodone -acetaminophen  (NORCO/VICODIN) 5-325 MG tablet, Take 1 tablet by mouth 2 (two)  times daily as needed for moderate pain (pain score 4-6). (Patient not taking: Reported on 08/26/2023), Disp: 10 tablet, Rfl: 0   ibuprofen (ADVIL) 800 MG tablet, Take 800 mg by mouth every 8 (eight) hours as needed for moderate pain (pain score 4-6)., Disp: , Rfl:    lisinopril  (ZESTRIL ) 10 MG tablet, TAKE 1 TABLET (10 MG TOTAL) BY MOUTH IN THE MORNING AND AT BEDTIME. (STOP TAKING LISINOPRIL  20MG ), Disp: 180 tablet, Rfl: 3   metFORMIN  (GLUCOPHAGE -XR) 500 MG 24 hr tablet, Take 2 tablets (1,000 mg total) by mouth daily with breakfast. (Patient taking differently: Take 500 mg by mouth 2 (two) times daily with a meal.), Disp: 180 tablet, Rfl: 3   metoprolol   tartrate (LOPRESSOR ) 25 MG tablet, Take 1 tablet (25 mg total) by mouth 2 (two) times daily., Disp: 180 tablet, Rfl: 3   ondansetron  (ZOFRAN ) 4 MG tablet, Take 1 tablet (4 mg total) by mouth every 8 (eight) hours as needed for nausea or vomiting. (Patient not taking: Reported on 08/26/2023), Disp: 20 tablet, Rfl: 0   OneTouch Delica Lancets 30G MISC, Check BS BID Dx E11.40, Disp: 200 each, Rfl: 3   ONETOUCH ULTRA test strip, CHECK BLOOD SUGAR TWICE DAILY DX 11.40, Disp: 200 strip, Rfl: 4   pramipexole  (MIRAPEX ) 0.25 MG tablet, Take 1 tablet 2 hours before bedtime for restless legs, Disp: 90 tablet, Rfl: 3   Semaglutide , 1 MG/DOSE, 4 MG/3ML SOPN, Inject 1 mg as directed every 7 (seven) days., Disp: 9 mL, Rfl: 3   TURMERIC PO, Take 1 capsule by mouth 2 (two) times daily.  (Patient not taking: Reported on 08/26/2023), Disp: , Rfl:    zolpidem  (AMBIEN ) 5 MG tablet, take 1 tablet(5 mg total) by mouth at bedtime as needed for sleep., Disp: 30 tablet, Rfl: 5 Social History   Socioeconomic History   Marital status: Married    Spouse name: Jimmy   Number of children: 2   Years of education: Not on file   Highest education level: Not on file  Occupational History   Occupation: retired  Tobacco Use   Smoking status: Never   Smokeless tobacco: Never  Vaping Use   Vaping status: Never Used  Substance and Sexual Activity   Alcohol use: No   Drug use: No   Sexual activity: Not on file  Other Topics Concern   Not on file  Social History Narrative   2 children  - son lives in [place], daughter 15 minutes away.   09/09/21 - She is having to stay home more to take care of husband who is not in good health   Social Drivers of Health   Financial Resource Strain: Low Risk  (05/26/2023)   Overall Financial Resource Strain (CARDIA)    Difficulty of Paying Living Expenses: Not hard at all  Food Insecurity: No Food Insecurity (08/26/2023)   Hunger Vital Sign    Worried About Running Out of Food in the  Last Year: Never true    Ran Out of Food in the Last Year: Never true  Transportation Needs: No Transportation Needs (08/26/2023)   PRAPARE - Administrator, Civil Service (Medical): No    Lack of Transportation (Non-Medical): No  Physical Activity: Patient Declined (05/26/2023)   Exercise Vital Sign    Days of Exercise per Week: Patient declined    Minutes of Exercise per Session: Patient declined  Stress: No Stress Concern Present (05/26/2023)   [employer] of Occupational Health - Occupational Stress Questionnaire  Feeling of Stress : Not at all  Social Connections: Moderately Isolated (05/26/2023)   Social Connection and Isolation Panel    Frequency of Communication with Friends and Family: More than three times a week    Frequency of Social Gatherings with Friends and Family: More than three times a week    Attends Religious Services: Never    Database administrator or Organizations: No    Attends Banker Meetings: Never    Marital Status: Married  Catering manager Violence: Not At Risk (08/26/2023)   Humiliation, Afraid, Rape, and Kick questionnaire    Fear of Current or Ex-Partner: No    Emotionally Abused: No    Physically Abused: No    Sexually Abused: No   Family History  Problem Relation Age of Onset   Cancer Mother        lung cancer   Cancer Father        lymphnodes   Diabetes Sister    Lung cancer Sister    Diabetes Brother    Cancer Maternal Grandfather    Heart disease Paternal Grandfather    Diabetes Son    Cancer Maternal Aunt        throat   Cancer Paternal Uncle        unknown    Cancer Other        breast   Colon cancer Neg Hx     Objective: Office vital signs reviewed. BP 138/72   Pulse 68   Temp (!) 97.3 F (36.3 C)   Ht 5' 5 (1.651 m)   Wt 150 lb (68 kg)   SpO2 99%   BMI 24.96 kg/m   Physical Examination:  General: Awake, alert, well-nourished, well-appearing female, No acute distress HEENT: Sclera  white.  Moist mucous membranes Cardio: regular rate and rhythm, S1S2 heard, no murmurs appreciated Pulm: clear to auscultation bilaterally, no wheezes, rhonchi or rales; normal work of breathing on room air     09/01/2023   11:27 AM 08/26/2023   11:00 AM 07/27/2023   11:03 AM  Depression screen PHQ 2/9  Decreased Interest 0 0 0  Down, Depressed, Hopeless 0 0 0  PHQ - 2 Score 0 0 0  Altered sleeping 0    Tired, decreased energy 0    Change in appetite 0    Feeling bad or failure about yourself  0    Trouble concentrating 0    Moving slowly or fidgety/restless 0    Suicidal thoughts 0    PHQ-9 Score 0    Difficult doing work/chores Not difficult at all        09/01/2023   11:27 AM 08/26/2023   11:00 AM 04/14/2023    8:36 AM 02/23/2023   11:26 AM  GAD 7 : Generalized Anxiety Score  Nervous, Anxious, on Edge 0  0 0  Control/stop worrying 0  0 0  Worry too much - different things 0  0 0  Trouble relaxing 0  0 0  Restless 0  0 0  Easily annoyed or irritable 0  0 0  Afraid - awful might happen 0  0 0  Total GAD 7 Score 0  0 0  Anxiety Difficulty Not difficult at all Not difficult at all Not difficult at all Not difficult at all   Assessment/ Plan: 73 y.o. female   Diabetes mellitus treated with oral medication (HCC) - Plan: Bayer DCA Hb A1c Waived  Diabetic polyneuropathy associated with type 2 diabetes  mellitus (HCC)  Hyperlipidemia associated with type 2 diabetes mellitus (HCC)  Hypertension associated with diabetes (HCC)  Primary insomnia - Plan: zolpidem  (AMBIEN ) 5 MG tablet  Sugar remains well-controlled.  I explained to her that I really Candace not know the use of ivermectin outside of helminth treatment.  I could not really find any good evidence to support use outside of treatment of parasites.  She would have to use that at her own discretion and again since it is not being formally prescribed and she is using something that is intended for zoonotic use I would be somewhat  hesitant to use it in a human.  I did try to look up any drug interactions for her and I could not find any but again not sure that there are enough studies to support chronic use of this medication.  I did go ahead and renew her Ambien  for 6 months and she will follow-up in 6 months for full physical with fasting labs, sooner if needed.  She is up-to-date on UDS, CSA and the national narcotic database was reviewed with no red flags  Candace CHRISTELLA Fielding, Candace [facility] [phone]

## 2023-09-10 ENCOUNTER — Encounter: Payer: Self-pay | Admitting: Pharmacist

## 2023-09-14 ENCOUNTER — Other Ambulatory Visit (HOSPITAL_COMMUNITY): Payer: Self-pay | Admitting: Family Medicine

## 2023-09-14 DIAGNOSIS — Z1231 Encounter for screening mammogram for malignant neoplasm of breast: Secondary | ICD-10-CM

## 2023-09-25 ENCOUNTER — Other Ambulatory Visit: Payer: Self-pay | Admitting: *Deleted

## 2023-09-25 NOTE — Patient Outreach (Signed)
 Complex Care Management   Visit Note  09/25/2023  Name:  Candace Lopez MRN: [id number] DOB: August 30, 1950  Situation: Referral received for Complex Care Management related to Diabetes with Complications I obtained verbal consent from Patient.  Visit completed with Patient  on the phone  Background:   Past Medical History:  Diagnosis Date   DDD (degenerative disc disease), lumbar    Diabetes mellitus without complication (HCC)    GERD (gastroesophageal reflux disease)    Hyperlipidemia    Hypertension    Urge incontinence of urine     Assessment: Patient Reported Symptoms:  Cognitive Cognitive Status: No symptoms reported Cognitive/Intellectual Conditions Management [RPT]: None reported or documented in medical history or problem list   Health Maintenance Behaviors: Annual physical exam Healing Pattern: Average Health Facilitated by: Rest  Neurological Neurological Review of Symptoms: No symptoms reported Neurological Management Strategies: Routine screening Neurological Self-Management Outcome: 4 (good)  HEENT HEENT Symptoms Reported: No symptoms reported HEENT Management Strategies: Routine screening HEENT Self-Management Outcome: 4 (good)    Cardiovascular Cardiovascular Symptoms Reported: No symptoms reported Does patient have uncontrolled Hypertension?: No Cardiovascular Management Strategies: Routine screening, Medication therapy Weight: 150 lb (68 kg) (patient reported) Cardiovascular Self-Management Outcome: 4 (good)  Respiratory Respiratory Symptoms Reported: No symptoms reported Respiratory Management Strategies: Routine screening Respiratory Self-Management Outcome: 4 (good)  Endocrine Endocrine Symptoms Reported: No symptoms reported Is patient diabetic?: Yes Is patient checking blood sugars at home?: Yes List most recent blood sugar readings, include date and time of day: 09-25-2023 135 Endocrine Self-Management Outcome: 4 (good)  Gastrointestinal  Gastrointestinal Symptoms Reported: No symptoms reported Additional Gastrointestinal Details: reports vomiting once weekly near her injection day Gastrointestinal Self-Management Outcome: 3 (uncertain) Nutrition Risk Screen (CP): No indicators present  Genitourinary Genitourinary Symptoms Reported: No symptoms reported Genitourinary Self-Management Outcome: 4 (good)  Integumentary Integumentary Symptoms Reported: No symptoms reported Skin Management Strategies: Routine screening Skin Self-Management Outcome: 4 (good)  Musculoskeletal Musculoskelatal Symptoms Reviewed: No symptoms reported Musculoskeletal Management Strategies: Routine screening Musculoskeletal Self-Management Outcome: 4 (good) Falls in the past year?: No Number of falls in past year: 1 or less Was there an injury with Fall?: No Fall Risk Category Calculator: 0 Patient Fall Risk Level: Low Fall Risk Patient at Risk for Falls Due to: No Fall Risks Fall risk Follow up: Falls evaluation completed  Psychosocial Psychosocial Symptoms Reported: No symptoms reported Behavioral Management Strategies: Support system Behavioral Health Self-Management Outcome: 4 (good) Major Change/Loss/Stressor/Fears (CP): Denies Techniques to Cope with Loss/Stress/Change: Not applicable Quality of Family Relationships: involved, helpful, supportive Do you feel physically threatened by others?: No    09/25/2023    PHQ2-9 Depression Screening   Little interest or pleasure in doing things Not at all  Feeling down, depressed, or hopeless Not at all  PHQ-2 - Total Score 0  Trouble falling or staying asleep, or sleeping too much    Feeling tired or having little energy    Poor appetite or overeating     Feeling bad about yourself - or that you are a failure or have let yourself or your family down    Trouble concentrating on things, such as reading the newspaper or watching television    Moving or speaking so slowly that other people could have  noticed.  Or the opposite - being so fidgety or restless that you have been moving around a lot more than usual    Thoughts that you would be better off dead, or hurting yourself in some way  PHQ2-9 Total Score    If you checked off any problems, how difficult have these problems made it for you to do your work, take care of things at home, or get along with other people    Depression Interventions/Treatment      Vitals:   09/25/23 1038  BP: 120/67  Pulse: 76    Medications Reviewed Today     Reviewed by Bertrum Rosina HERO, RN (Registered Nurse) on 09/25/23 at 1042  Med List Status: <None>   Medication Order Taking? Sig Documenting Provider Last Dose Status Informant  alendronate  (FOSAMAX ) 70 MG tablet [id number] Yes Take 1 tablet (70 mg total) by mouth every 7 (seven) days. Take with a full glass of water on an empty stomach. Jolinda Norene HERO, DO  Active   Alpha-Lipoic Acid 600 MG CAPS [id number] Yes Take 1 capsule (600 mg total) by mouth daily. For diabetic neuropathy Jolinda Norene HERO, DO  Active            Med Note ADALBERTO, LYNNE G   Wed Aug 26, 2023 10:40 AM)    aspirin  EC 81 MG tablet [id number] Yes Take 81 mg by mouth in the morning and at bedtime. Swallow whole. [provider]  Active   atorvastatin  (LIPITOR) 40 MG tablet [id number] Yes TAKE 1 TABLET BY MOUTH EVERYDAY AT BEDTIME Jolinda Norene M, DO  Active   Blood Glucose Monitoring Suppl (ONE TOUCH ULTRA 2) w/Device KIT [id number] Yes UAD to check BGs E11.9 Jolinda Norene M, DO  Active   glipiZIDE  (GLUCOTROL  XL) 5 MG 24 hr tablet [id number] Yes TAKE 1 TABLET (5 MG TOTAL) BY MOUTH DAILY WITH BREAKFAST. FOR FASTING SUGAR >200.  Patient taking differently: Take 5 mg by mouth as needed. For fasting sugar >200.   Jolinda Norene M, DO  Active   ibuprofen (ADVIL) 800 MG tablet [id number] Yes Take 800 mg by mouth every 8 (eight) hours as needed for moderate pain (pain score 4-6). [provider]  Active    lisinopril  (ZESTRIL ) 10 MG tablet [id number] Yes TAKE 1 TABLET (10 MG TOTAL) BY MOUTH IN THE MORNING AND AT BEDTIME. (STOP TAKING LISINOPRIL  20MG ) Gottschalk, Ashly M, DO  Active   metFORMIN  (GLUCOPHAGE -XR) 500 MG 24 hr tablet [id number] Yes Take 2 tablets (1,000 mg total) by mouth daily with breakfast.  Patient taking differently: Take 500 mg by mouth 2 (two) times daily with a meal.   Jolinda, Ashly M, DO  Active   metoprolol  tartrate (LOPRESSOR ) 25 MG tablet [id number] Yes Take 1 tablet (25 mg total) by mouth 2 (two) times daily. Jolinda Norene HERO, DO  Active   OneTouch Delica Lancets 30G [place] [id number] Yes Check BS BID Dx E11.40 Jolinda Norene HERO, DO  Active   [employer] ULTRA test strip [id number] Yes CHECK BLOOD SUGAR TWICE DAILY DX 11.40 Jolinda Norene M, DO  Active   pramipexole  (MIRAPEX ) 0.25 MG tablet [id number] Yes Take 1 tablet 2 hours before bedtime for restless legs Jolinda Norene M, DO  Active   Semaglutide , 1 MG/DOSE, 4 MG/3ML SOPN [id number] Yes Inject 1 mg as directed every 7 (seven) days. Jolinda Norene HERO, DO  Active            Med Note TENA, JULIE D   Thu Apr 09, 2023  8:51 AM) Via [employer] patient assistance program    TURMERIC PO [id number] Yes Take 1 capsule by mouth 2 (two) times daily.  [provider]  Active Self  zolpidem  (AMBIEN ) 5 MG  tablet [id number] Yes take 1 tablet(5 mg total) by mouth at bedtime as needed for sleep. Put on file Jolinda Norene HERO, DO  Active             Recommendation:   Continue Current Plan of Care  Follow Up Plan:   Telephone follow-up in 1 month  Rosina Forte, BSN RN [facility], [facility] RN Care Manager Direct Dial: [phone]  Fax: [phone]

## 2023-09-25 NOTE — Patient Instructions (Signed)
 Visit Information  Thank you for taking time to visit with me today. Please don't hesitate to contact me if I can be of assistance to you before our next scheduled appointment.  Your next care management appointment is by telephone on 10-26-2023 at 10:30 am  Telephone follow-up in 1 month  Please call the care guide team at [phone] if you need to cancel, schedule, or reschedule an appointment.   Please call the Suicide and Crisis Lifeline: 988 call the [place]  National Suicide Prevention Lifeline: 440-722-1754 or TTY: 820 863 1111 TTY 671 034 4267) to talk to a trained counselor call 1-800-273-TALK (toll free, 24 hour hotline) call the [facility]: [phone] call 911 if you are experiencing a Mental Health or Behavioral Health Crisis or need someone to talk to.  Rosina Forte, BSN RN [facility], [facility] RN Care Manager Direct Dial: [phone]  Fax: [phone]

## 2023-09-29 ENCOUNTER — Telehealth: Payer: Self-pay | Admitting: *Deleted

## 2023-09-29 NOTE — Telephone Encounter (Signed)
 Received fax stating that patient had tried contacting someone within [facility] over the weekend regarding a break out over her body from mowing her hay field.  I called and left her a message to follow up and see if she wanted an appt to be seen or if she had already been seen at Urgent Care/ER for this.

## 2023-10-19 ENCOUNTER — Ambulatory Visit (HOSPITAL_COMMUNITY)
Admission: RE | Admit: 2023-10-19 | Discharge: 2023-10-19 | Disposition: A | Discharge status: Home / Self Care | Source: Ambulatory Visit | Attending: Family Medicine | Admitting: Family Medicine

## 2023-10-19 ENCOUNTER — Other Ambulatory Visit: Payer: Self-pay | Admitting: Family Medicine

## 2023-10-19 DIAGNOSIS — Z1231 Encounter for screening mammogram for malignant neoplasm of breast: Secondary | ICD-10-CM | POA: Diagnosis not present

## 2023-10-19 DIAGNOSIS — E1140 Type 2 diabetes mellitus with diabetic neuropathy, unspecified: Secondary | ICD-10-CM

## 2023-10-20 ENCOUNTER — Encounter: Payer: Self-pay | Admitting: Family Medicine

## 2023-10-20 NOTE — Telephone Encounter (Signed)
 Letter sent

## 2023-10-20 NOTE — Telephone Encounter (Signed)
 Gottschalk NTBS in Feb for 6 mos FU RF sent to pharmacy

## 2023-10-24 DIAGNOSIS — D72829 Elevated white blood cell count, unspecified: Secondary | ICD-10-CM | POA: Diagnosis not present

## 2023-10-24 DIAGNOSIS — I10 Essential (primary) hypertension: Secondary | ICD-10-CM | POA: Diagnosis not present

## 2023-10-24 DIAGNOSIS — T782XXA Anaphylactic shock, unspecified, initial encounter: Secondary | ICD-10-CM | POA: Diagnosis not present

## 2023-10-24 DIAGNOSIS — G2581 Restless legs syndrome: Secondary | ICD-10-CM | POA: Diagnosis not present

## 2023-10-24 DIAGNOSIS — E860 Dehydration: Secondary | ICD-10-CM | POA: Diagnosis not present

## 2023-10-24 DIAGNOSIS — E1159 Type 2 diabetes mellitus with other circulatory complications: Secondary | ICD-10-CM | POA: Diagnosis not present

## 2023-10-24 DIAGNOSIS — I152 Hypertension secondary to endocrine disorders: Secondary | ICD-10-CM | POA: Diagnosis not present

## 2023-10-24 DIAGNOSIS — E785 Hyperlipidemia, unspecified: Secondary | ICD-10-CM | POA: Diagnosis not present

## 2023-10-24 DIAGNOSIS — R0602 Shortness of breath: Secondary | ICD-10-CM | POA: Diagnosis not present

## 2023-10-24 DIAGNOSIS — E1142 Type 2 diabetes mellitus with diabetic polyneuropathy: Secondary | ICD-10-CM | POA: Diagnosis not present

## 2023-10-24 DIAGNOSIS — E1169 Type 2 diabetes mellitus with other specified complication: Secondary | ICD-10-CM | POA: Diagnosis not present

## 2023-10-24 DIAGNOSIS — R112 Nausea with vomiting, unspecified: Secondary | ICD-10-CM | POA: Diagnosis not present

## 2023-10-24 DIAGNOSIS — L500 Allergic urticaria: Secondary | ICD-10-CM | POA: Diagnosis not present

## 2023-10-24 DIAGNOSIS — E872 Acidosis, unspecified: Secondary | ICD-10-CM | POA: Diagnosis not present

## 2023-10-24 DIAGNOSIS — R55 Syncope and collapse: Secondary | ICD-10-CM | POA: Diagnosis not present

## 2023-10-24 DIAGNOSIS — Z7982 Long term (current) use of aspirin: Secondary | ICD-10-CM | POA: Diagnosis not present

## 2023-10-25 DIAGNOSIS — E785 Hyperlipidemia, unspecified: Secondary | ICD-10-CM | POA: Diagnosis not present

## 2023-10-25 DIAGNOSIS — R55 Syncope and collapse: Secondary | ICD-10-CM | POA: Diagnosis not present

## 2023-10-25 DIAGNOSIS — I10 Essential (primary) hypertension: Secondary | ICD-10-CM | POA: Diagnosis not present

## 2023-10-25 DIAGNOSIS — T782XXA Anaphylactic shock, unspecified, initial encounter: Secondary | ICD-10-CM | POA: Diagnosis not present

## 2023-10-25 DIAGNOSIS — R6510 Systemic inflammatory response syndrome (SIRS) of non-infectious origin without acute organ dysfunction: Secondary | ICD-10-CM | POA: Diagnosis not present

## 2023-10-25 DIAGNOSIS — R509 Fever, unspecified: Secondary | ICD-10-CM | POA: Diagnosis not present

## 2023-10-25 DIAGNOSIS — E860 Dehydration: Secondary | ICD-10-CM | POA: Diagnosis not present

## 2023-10-25 DIAGNOSIS — R0602 Shortness of breath: Secondary | ICD-10-CM | POA: Diagnosis not present

## 2023-10-25 DIAGNOSIS — E1122 Type 2 diabetes mellitus with diabetic chronic kidney disease: Secondary | ICD-10-CM | POA: Diagnosis not present

## 2023-10-25 DIAGNOSIS — D72829 Elevated white blood cell count, unspecified: Secondary | ICD-10-CM | POA: Diagnosis not present

## 2023-10-25 DIAGNOSIS — Z7985 Long-term (current) use of injectable non-insulin antidiabetic drugs: Secondary | ICD-10-CM | POA: Diagnosis not present

## 2023-10-25 DIAGNOSIS — I129 Hypertensive chronic kidney disease with stage 1 through stage 4 chronic kidney disease, or unspecified chronic kidney disease: Secondary | ICD-10-CM | POA: Diagnosis not present

## 2023-10-25 DIAGNOSIS — Z7901 Long term (current) use of anticoagulants: Secondary | ICD-10-CM | POA: Diagnosis not present

## 2023-10-25 DIAGNOSIS — I959 Hypotension, unspecified: Secondary | ICD-10-CM | POA: Diagnosis not present

## 2023-10-25 DIAGNOSIS — E1165 Type 2 diabetes mellitus with hyperglycemia: Secondary | ICD-10-CM | POA: Diagnosis not present

## 2023-10-25 DIAGNOSIS — N1831 Chronic kidney disease, stage 3a: Secondary | ICD-10-CM | POA: Diagnosis not present

## 2023-10-25 DIAGNOSIS — E872 Acidosis, unspecified: Secondary | ICD-10-CM | POA: Diagnosis not present

## 2023-10-25 DIAGNOSIS — Z7982 Long term (current) use of aspirin: Secondary | ICD-10-CM | POA: Diagnosis not present

## 2023-10-25 DIAGNOSIS — Z7984 Long term (current) use of oral hypoglycemic drugs: Secondary | ICD-10-CM | POA: Diagnosis not present

## 2023-10-25 DIAGNOSIS — G2581 Restless legs syndrome: Secondary | ICD-10-CM | POA: Diagnosis not present

## 2023-10-25 DIAGNOSIS — Z7902 Long term (current) use of antithrombotics/antiplatelets: Secondary | ICD-10-CM | POA: Diagnosis not present

## 2023-10-26 ENCOUNTER — Other Ambulatory Visit: Payer: Self-pay | Admitting: *Deleted

## 2023-10-26 NOTE — Patient Outreach (Signed)
 Patient currently admitted at [facility] since 10/24/23. She states she was admitted for anaphylaxis and is hopeful to be discharged today. TOC RN and was advised that patient may or may not show on TOC list for follow up. RNCM to reschedule patient.  Rosina Forte, BSN RN [facility], [facility] RN Care Manager Direct Dial: [phone]  Fax: [phone]

## 2023-10-26 NOTE — Patient Instructions (Signed)
 Visit Information  Thank you for taking time to visit with me today. Please don't hesitate to contact me if I can be of assistance to you before our next scheduled appointment.  Your next care management appointment is by telephone on 11-05-2023 at 9:30 am    Please call the care guide team at [phone] if you need to cancel, schedule, or reschedule an appointment.   Please call the Suicide and Crisis Lifeline: 988 call the [place]  National Suicide Prevention Lifeline: (415)002-0328 or TTY: (216) 822-1529 TTY (424) 580-8862) to talk to a trained counselor call 1-800-273-TALK (toll free, 24 hour hotline) if you are experiencing a Mental Health or Behavioral Health Crisis or need someone to talk to.  Rosina Forte, BSN RN [facility], [facility] RN Care Manager Direct Dial: [phone]  Fax: [phone]

## 2023-10-27 ENCOUNTER — Telehealth: Payer: Self-pay | Admitting: *Deleted

## 2023-10-27 NOTE — Transitions of Care (Post Inpatient/ED Visit) (Signed)
 10/27/2023  Name: Candace Lopez MRN: [id number] DOB: 28-Jun-1950  Today's TOC FU Call Status: Today's TOC FU Call Status:: Successful TOC FU Call Completed TOC FU Call Complete Date: 10/27/23 Patient's Name and Date of Birth confirmed.  Transition Care Management Follow-up Telephone Call Date of Discharge: 10/26/23 Discharge Facility: Other [facility]) Name of Other ([facility]) Discharge Facility: [facility] Type of Discharge: Inpatient Admission Primary Inpatient Discharge Diagnosis:: Diagnoses   Anaphylaxis, How have you been since you were released from the hospital?:  (eating, drinking well, ambulating without difficult, independent with ADL, IADL's) Any questions or concerns?: No  Items Reviewed: Did you receive and understand the discharge instructions provided?: Yes Medications obtained,verified, and reconciled?: Yes (Medications Reviewed) Any new allergies since your discharge?:  (pt is not sure what caused her recent anaphylaxis) Dietary orders reviewed?: Yes Type of Diet Ordered:: heart healthy, carbohydrate modified Do you have support at home?: Yes People in Home [RPT]: spouse Name of Support/Comfort Primary Source: Krissa Utke  Medications Reviewed Today: Medications Reviewed Today     Reviewed by Aura Mliss LABOR, RN (Registered Nurse) on 10/27/23 at 1418  Med List Status: <None>   Medication Order Taking? Sig Documenting Provider Last Dose Status Informant  alendronate  (FOSAMAX ) 70 MG tablet [id number] Yes Take 1 tablet (70 mg total) by mouth every 7 (seven) days. Take with a full glass of water on an empty stomach. Jolinda Norene HERO, DO  Active   Alpha-Lipoic Acid 600 MG CAPS [id number] Yes Take 1 capsule (600 mg total) by mouth daily. For diabetic neuropathy Jolinda Norene HERO, DO  Active            Med Note ADALBERTO, LYNNE G   Wed Aug 26, 2023 10:40 AM)    amLODipine (NORVASC) 5 MG tablet [id number] Yes Take 5 mg by mouth daily.  [provider]  Active   aspirin  EC 81 MG tablet [id number]  Take 81 mg by mouth in the morning and at bedtime. Swallow whole.  Patient not taking: Reported on 10/27/2023   [provider]  Active   atorvastatin  (LIPITOR) 40 MG tablet [id number] Yes TAKE 1 TABLET BY MOUTH EVERYDAY AT BEDTIME Jolinda Norene M, DO  Active   Blood Glucose Monitoring Suppl (ONE TOUCH ULTRA 2) w/Device KIT [id number] Yes UAD to check BGs E11.9 Jolinda Norene M, DO  Active   cetirizine (ZYRTEC) 10 MG tablet [id number] Yes Take 10 mg by mouth daily. [provider]  Active   Cholecalciferol (GNP VITAMIN D ) 25 MCG (1000 UT) tablet [id number] Yes Take 1,000 Units by mouth daily. [provider]  Active   EPINEPHrine 0.3 mg/0.3 mL IJ SOAJ injection [id number] Yes Inject 0.3 mg into the muscle as needed. [provider]  Active   famotidine  (PEPCID ) 20 MG tablet [id number] Yes Take 20 mg by mouth daily. [provider]  Active   glipiZIDE  (GLUCOTROL  XL) 5 MG 24 hr tablet [id number] Yes TAKE 1 TABLET (5 MG TOTAL) BY MOUTH DAILY WITH BREAKFAST. FOR FASTING SUGAR >200. Jolinda Norene M, DO  Active   ibuprofen (ADVIL) 800 MG tablet [id number] Yes Take 800 mg by mouth every 8 (eight) hours as needed for moderate pain (pain score 4-6). [provider]  Active   lisinopril  (ZESTRIL ) 10 MG tablet [id number]  TAKE 1 TABLET (10 MG TOTAL) BY MOUTH IN THE MORNING AND AT BEDTIME. (STOP TAKING LISINOPRIL  20MG )  Patient not taking: Reported on 10/27/2023   Jolinda Norene HERO, DO  Active   metFORMIN  (GLUCOPHAGE -XR) 500 MG 24 hr tablet [id number] Yes Take 2 tablets (1,000 mg total) by mouth daily with breakfast. Jolinda Potter M, DO  Active   metoprolol  tartrate (LOPRESSOR ) 25 MG tablet [id number] Yes Take 1 tablet (25 mg total) by mouth 2 (two) times daily. Jolinda Potter HERO, DO  Active   OneTouch Delica Lancets 30G [place] [id number] Yes Check BS BID Dx E11.40 Jolinda Potter HERO,  DO  Active   [facility] ULTRA test strip [id number] Yes CHECK BLOOD SUGAR TWICE DAILY DX 11.40 Jolinda Potter M, DO  Active   pramipexole  (MIRAPEX ) 0.25 MG tablet [id number] Yes Take 1 tablet 2 hours before bedtime for restless legs Jolinda Potter M, DO  Active   predniSONE  (DELTASONE ) 20 MG tablet [id number] Yes Take 20 mg by mouth daily with breakfast. Titrated dose [provider]  Active   Semaglutide , 1 MG/DOSE, 4 MG/3ML SOPN [id number] Yes Inject 1 mg as directed every 7 (seven) days. Jolinda Potter HERO, DO  Active            Med Note TENA, Tedi Hughson D   Thu Apr 09, 2023  8:51 AM) Via [employer] patient assistance program    TURMERIC PO [id number] Yes Take 1 capsule by mouth 2 (two) times daily.  [provider]  Active Self  zolpidem  (AMBIEN ) 5 MG tablet [id number] Yes take 1 tablet(5 mg total) by mouth at bedtime as needed for sleep. Put on file Jolinda Potter HERO, DO  Active             Home Care and Equipment/Supplies: Were Home Health Services Ordered?: No Any new equipment or medical supplies ordered?: No  Functional Questionnaire: Do you need assistance with bathing/showering or dressing?: No Do you need assistance with meal preparation?: No Do you need assistance with eating?: No Do you have difficulty maintaining continence: No Do you need assistance with getting out of bed/getting out of a chair/moving?: No Do you have difficulty managing or taking your medications?: No  Follow up appointments reviewed: PCP Follow-up appointment confirmed?: Yes Date of PCP follow-up appointment?: 10/28/23 Follow-up Provider: Potter Jolinda DO Specialist Hospital Follow-up appointment confirmed?:  (pt will be following up with allergist) Reason Specialist Follow-Up Not Confirmed: Patient has Specialist Provider Number and will Call for Appointment Do you need transportation to your follow-up appointment?: No Do you understand care options if your condition(s)  worsen?: Yes-patient verbalized understanding  SDOH Interventions Today    Flowsheet Row Most Recent Value  SDOH Interventions   Food Insecurity Interventions Intervention Not Indicated  Housing Interventions Intervention Not Indicated  Transportation Interventions Intervention Not Indicated  Utilities Interventions Intervention Not Indicated     Goals Addressed             This Visit's Progress    VBCI Transitions of Care (TOC) Care Plan       Problems:  Recent Hospitalization for treatment of anaphylaxis Pt will see primary care provider 10/28/23, will discuss referral to allergist  Goal:  Over the next 30 days, the patient will not experience hospital readmission  Interventions:  Evaluation of current treatment plan related to anaphylaxis,  self-management and patient's adherence to plan as established by provider. Discussed plans with patient for ongoing care management follow up and provided patient with direct contact information for care management team Evaluation of current treatment plan related to anaphylaxis and patient's adherence to plan as established by provider Discussed plans with patient for ongoing care management follow up and  provided patient with direct contact information for care management team Screening for signs and symptoms of depression related to chronic disease state  Assessed social determinant of health barriers Reviewed signs/ symptoms of anaphylaxis, reportable signs/ symptoms, importance of using epi pen as prescribed, seeking emergency assistance as needed  Patient Self Care Activities:  Attend all scheduled provider appointments Attend church or other social activities Call pharmacy for medication refills 3-7 days in advance of running out of medications Call provider office for new concerns or questions  Notify RN Care Manager of TOC call rescheduling needs Participate in Transition of Care Program/Attend TOC scheduled calls Perform all  self care activities independently  Perform IADL's (shopping, preparing meals, housekeeping, managing finances) independently Take medications as prescribed    Plan:  Telephone follow up appointment with care management team member scheduled for:  11/03/23 @ 215 pm The patient has been provided with contact information for the care management team and has been advised to call with any health related questions or concerns.         Mliss Creed [facility], BSN RN Care Manager/ Transition of Care [place]/ [facility] [phone]

## 2023-10-28 ENCOUNTER — Encounter: Payer: Self-pay | Admitting: Family Medicine

## 2023-10-28 ENCOUNTER — Ambulatory Visit

## 2023-10-28 VITALS — BP 137/73 | HR 82 | Temp 97.5°F | Ht 65.0 in | Wt 149.2 lb

## 2023-10-28 DIAGNOSIS — T782XXD Anaphylactic shock, unspecified, subsequent encounter: Secondary | ICD-10-CM

## 2023-10-28 DIAGNOSIS — Z09 Encounter for follow-up examination after completed treatment for conditions other than malignant neoplasm: Secondary | ICD-10-CM | POA: Diagnosis not present

## 2023-10-28 NOTE — Progress Notes (Signed)
 Subjective: Candace Lopez follow up for anaphylaxis PCP: Jolinda Candace HERO, DO YEP:Gjwprz H Crunkleton is a 73 y.o. female presenting to clinic today for:  History of Present Illness  Patient was admitted to [facility] for anaphylactic shock.  She apparently had hives and started feeling dizzy and anticipates she was going to need to go to the ER and so started taking a shower and then passed out in the shower.  She apparently had vomited at some point and was transported to [facility] via EMS.  She had multiple allergy testing done and had a couple blood test that demonstrated high IgE to peanut and soybean but the rest were either low, or moderate.  They really did not totally determine what she is actually allergic to.  She does have an EpiPen on hand and is taking prednisone  along with Pepcid  and Zyrtec.  She reports rash is improving but still present throughout her body.   ROS: Per HPI  Allergies  Allergen Reactions   Tape Rash   Past Medical History:  Diagnosis Date   DDD (degenerative disc disease), lumbar    Diabetes mellitus without complication (HCC)    GERD (gastroesophageal reflux disease)    Hyperlipidemia    Hypertension    Urge incontinence of urine     Current Outpatient Medications:    alendronate  (FOSAMAX ) 70 MG tablet, Take 1 tablet (70 mg total) by mouth every 7 (seven) days. Take with a full glass of water on an empty stomach., Disp: 12 tablet, Rfl: 3   Alpha-Lipoic Acid 600 MG CAPS, Take 1 capsule (600 mg total) by mouth daily. For diabetic neuropathy, Disp: 90 capsule, Rfl: 3   amLODipine (NORVASC) 5 MG tablet, Take 5 mg by mouth daily., Disp: , Rfl:    atorvastatin  (LIPITOR) 40 MG tablet, TAKE 1 TABLET BY MOUTH EVERYDAY AT BEDTIME, Disp: 90 tablet, Rfl: 3   Blood Glucose Monitoring Suppl (ONE TOUCH ULTRA 2) w/Device KIT, UAD to check BGs E11.9, Disp: 1 kit, Rfl: 0   cetirizine (ZYRTEC) 10 MG tablet, Take 10 mg by mouth daily., Disp: , Rfl:    Cholecalciferol (GNP  VITAMIN D ) 25 MCG (1000 UT) tablet, Take 1,000 Units by mouth daily., Disp: , Rfl:    EPINEPHrine 0.3 mg/0.3 mL IJ SOAJ injection, Inject 0.3 mg into the muscle as needed., Disp: , Rfl:    famotidine  (PEPCID ) 20 MG tablet, Take 20 mg by mouth daily., Disp: , Rfl:    glipiZIDE  (GLUCOTROL  XL) 5 MG 24 hr tablet, TAKE 1 TABLET (5 MG TOTAL) BY MOUTH DAILY WITH BREAKFAST. FOR FASTING SUGAR >200., Disp: 90 tablet, Rfl: 1   ibuprofen (ADVIL) 800 MG tablet, Take 800 mg by mouth every 8 (eight) hours as needed for moderate pain (pain score 4-6)., Disp: , Rfl:    metFORMIN  (GLUCOPHAGE -XR) 500 MG 24 hr tablet, Take 2 tablets (1,000 mg total) by mouth daily with breakfast., Disp: 180 tablet, Rfl: 3   metoprolol  tartrate (LOPRESSOR ) 25 MG tablet, Take 1 tablet (25 mg total) by mouth 2 (two) times daily., Disp: 180 tablet, Rfl: 3   OneTouch Delica Lancets 30G MISC, Check BS BID Dx E11.40, Disp: 200 each, Rfl: 3   ONETOUCH ULTRA test strip, CHECK BLOOD SUGAR TWICE DAILY DX 11.40, Disp: 200 strip, Rfl: 4   pramipexole  (MIRAPEX ) 0.25 MG tablet, Take 1 tablet 2 hours before bedtime for restless legs, Disp: 90 tablet, Rfl: 3   predniSONE  (DELTASONE ) 20 MG tablet, Take 20 mg by mouth daily  with breakfast. Titrated dose, Disp: , Rfl:    Semaglutide , 1 MG/DOSE, 4 MG/3ML SOPN, Inject 1 mg as directed every 7 (seven) days., Disp: 9 mL, Rfl: 3   TURMERIC PO, Take 1 capsule by mouth 2 (two) times daily. , Disp: , Rfl:    zolpidem  (AMBIEN ) 5 MG tablet, take 1 tablet(5 mg total) by mouth at bedtime as needed for sleep. Put on file, Disp: 30 tablet, Rfl: 5   aspirin  EC 81 MG tablet, Take 81 mg by mouth in the morning and at bedtime. Swallow whole. (Patient not taking: Reported on 10/28/2023), Disp: , Rfl:    lisinopril  (ZESTRIL ) 10 MG tablet, TAKE 1 TABLET (10 MG TOTAL) BY MOUTH IN THE MORNING AND AT BEDTIME. (STOP TAKING LISINOPRIL  20MG ) (Patient not taking: Reported on 10/28/2023), Disp: 180 tablet, Rfl: 3 Social History    Socioeconomic History   Marital status: Married    Spouse name: Jimmy   Number of children: 2   Years of education: Not on file   Highest education level: 12th grade  Occupational History   Occupation: retired  Tobacco Use   Smoking status: Never   Smokeless tobacco: Never  Vaping Use   Vaping status: Never Used  Substance and Sexual Activity   Alcohol use: No   Drug use: No   Sexual activity: Not on file  Other Topics Concern   Not on file  Social History Narrative   2 children  - son lives in [place], daughter 15 minutes away.   09/09/21 - She is having to stay home more to take care of husband who is not in good health   Social Drivers of Health   Financial Resource Strain: Low Risk  (10/27/2023)   Overall Financial Resource Strain (CARDIA)    Difficulty of Paying Living Expenses: Not hard at all  Food Insecurity: No Food Insecurity (10/27/2023)   Hunger Vital Sign    Worried About Running Out of Food in the Last Year: Never true    Ran Out of Food in the Last Year: Never true  Transportation Needs: No Transportation Needs (10/27/2023)   PRAPARE - Administrator, Civil Service (Medical): No    Lack of Transportation (Non-Medical): No  Physical Activity: Insufficiently Active (10/27/2023)   Exercise Vital Sign    Days of Exercise per Week: 1 day    Minutes of Exercise per Session: 20 min  Stress: No Stress Concern Present (10/27/2023)   [employer] of Occupational Health - Occupational Stress Questionnaire    Feeling of Stress: Not at all  Social Connections: Moderately Isolated (10/27/2023)   Social Connection and Isolation Panel    Frequency of Communication with Friends and Family: More than three times a week    Frequency of Social Gatherings with Friends and Family: More than three times a week    Attends Religious Services: Patient declined    Database administrator or Organizations: No    Attends Engineer, structural: Not on file     Marital Status: Married  Catering manager Violence: Not At Risk (10/27/2023)   Humiliation, Afraid, Rape, and Kick questionnaire    Fear of Current or Ex-Partner: No    Emotionally Abused: No    Physically Abused: No    Sexually Abused: No   Family History  Problem Relation Age of Onset   Cancer Mother        lung cancer   Cancer Father  lymphnodes   Diabetes Sister    Lung cancer Sister    Diabetes Brother    Cancer Maternal Grandfather    Heart disease Paternal Grandfather    Diabetes Son    Cancer Maternal Aunt        throat   Cancer Paternal Uncle        unknown    Cancer Other        breast   Colon cancer Neg Hx     Objective: Office vital signs reviewed. BP 137/73   Pulse 82   Temp (!) 97.5 F (36.4 C)   Ht 5' 5 (1.651 m)   Wt 149 lb 4 oz (67.7 kg)   SpO2 95%   BMI 24.84 kg/m   Physical Examination:  General: Awake, alert, well nourished, No acute distress HEENT: No facial swelling. Pulm: Normal work of breathing on room air Skin: Has a fine, minimally erythematous dry rash noted throughout her trunk and bilateral upper extremities and lower extremities.      Assessment/ Plan: 73 y.o. female  Assessment & Plan   Anaphylaxis, subsequent encounter - Plan: Ambulatory referral to Allergy  Hospital discharge follow-up  Allergy to unknown substance.  We reviewed the blood allergy testing that was performed which demonstrated high peanut and soybean IgE.  Discussed consideration for red meat allergy testing at some point as well.  Has appropriate medications if needed for anaphylaxis.  We reviewed again how to utilize EpiPen appropriately.  Sent referral to allergy for further testing and management placed.   Orders Placed This Encounter  Procedures   Ambulatory referral to Allergy    Referral Priority:   Urgent    Referral Type:   Allergy Testing    Referral Reason:   Specialty Services Required    Requested Specialty:   Allergy    Number of  Visits Requested:   1   No orders of the defined types were placed in this encounter.   Today's visit is for Transitional Care Management.  The patient was discharged from [facility] on 10/26/2023 with a primary diagnosis of anaphylaxis due to unknown trigger.   Contact with the patient and/or caregiver, by a clinical staff member, was made on 10/27/2023 and was documented as a telephone encounter within the EMR.  Through chart review and discussion with the patient I have determined that management of their condition is of moderate complexity.    Candace CHRISTELLA Fielding, DO [facility] [phone]

## 2023-11-03 ENCOUNTER — Other Ambulatory Visit: Payer: Self-pay | Admitting: *Deleted

## 2023-11-03 NOTE — Patient Instructions (Signed)
 Visit Information  Thank you for taking time to visit with me today. Please don't hesitate to contact me if I can be of assistance to you before our next scheduled telephone appointment.  Our next appointment is by telephone on 11/10/23 @ 215 pm  Following is a copy of your care plan:   Goals Addressed             This Visit's Progress    VBCI Transitions of Care (TOC) Care Plan       Problems:  Recent Hospitalization for treatment of anaphylaxis Pt saw primary care provider 10/28/23, will be seeing allergist on 11/20/23, no new concerns reported  Goal:  Over the next 30 days, the patient will not experience hospital readmission  Interventions:  Evaluation of current treatment plan related to anaphylaxis,  self-management and patient's adherence to plan as established by provider. Discussed plans with patient for ongoing care management follow up and provided patient with direct contact information for care management team Evaluation of current treatment plan related to anaphylaxis and patient's adherence to plan as established by provider Discussed plans with patient for ongoing care management follow up and provided patient with direct contact information for care management team Reinforced signs/ symptoms of anaphylaxis, reportable signs/ symptoms, importance of using epi pen as prescribed, seeking emergency assistance as needed  Patient Self Care Activities:  Attend all scheduled provider appointments Attend church or other social activities Call pharmacy for medication refills 3-7 days in advance of running out of medications Call provider office for new concerns or questions  Notify RN Care Manager of TOC call rescheduling needs Participate in Transition of Care Program/Attend TOC scheduled calls Perform all self care activities independently  Perform IADL's (shopping, preparing meals, housekeeping, managing finances) independently Take medications as prescribed    Plan:   Telephone follow up appointment with care management team member scheduled for:  11/10/23 @ 215 pm The patient has been provided with contact information for the care management team and has been advised to call with any health related questions or concerns.         Patient verbalizes understanding of instructions and care plan provided today and agrees to view in MyChart. Active MyChart status and patient understanding of how to access instructions and care plan via MyChart confirmed with patient.     Telephone follow up appointment with care management team member scheduled for: 11/10/23 @ 215 pm  Please call the care guide team at [phone] if you need to cancel or reschedule your appointment.   Please call the Suicide and Crisis Lifeline: 988 call the [place]  National Suicide Prevention Lifeline: 475 441 6949 or TTY: (229)077-1978 TTY 845-877-3442) to talk to a trained counselor call 1-800-273-TALK (toll free, 24 hour hotline) go to [facility] [address], [place] [phone]) call the [facility] Crisis Line: (743)253-4363 call 911 if you are experiencing a Mental Health or Behavioral Health Crisis or need someone to talk to.  Mliss Creed [facility], BSN RN Care Manager/ Transition of Care [place]/ [place] [facility] [phone]

## 2023-11-03 NOTE — Patient Outreach (Signed)
 Transition of Care week 2  Visit Note  11/03/2023  Name: Candace Lopez MRN: [id number]          DOB: [birth year]  Situation: Patient enrolled in [facility] 30-day program. Visit completed with patient by telephone.   Background:    Past Medical History:  Diagnosis Date   DDD (degenerative disc disease), lumbar    Diabetes mellitus without complication (HCC)    GERD (gastroesophageal reflux disease)    Hyperlipidemia    Hypertension    Urge incontinence of urine     Assessment: Patient Reported Symptoms: Cognitive Cognitive Status: No symptoms reported, Alert and oriented to person, place, and time, Normal speech and language skills, Able to follow simple commands      Neurological Neurological Review of Symptoms: No symptoms reported    HEENT HEENT Symptoms Reported: No symptoms reported      Cardiovascular Cardiovascular Symptoms Reported: No symptoms reported    Respiratory Respiratory Symptoms Reported: No symptoms reported    Endocrine Endocrine Symptoms Reported: No symptoms reported Is patient diabetic?: Yes Is patient checking blood sugars at home?: Yes List most recent blood sugar readings, include date and time of day: FBS today 150 Endocrine Self-Management Outcome: 4 (good) Endocrine Comment: reinforced carbohydate modified diet  Gastrointestinal Gastrointestinal Symptoms Reported: No symptoms reported      Genitourinary Genitourinary Symptoms Reported: No symptoms reported    Integumentary Integumentary Symptoms Reported: No symptoms reported Additional Integumentary Details: pt states itching has resolved, states  may have once in awhile  pt to see allergist 11/20/23 Skin Management Strategies: Medication therapy, Routine screening Skin Self-Management Outcome: 3 (uncertain) Skin Comment: reinforced signs/ symptoms of anaphylaxis, importance of using epipen as instructed, seeking emergency care as needed  Musculoskeletal Musculoskelatal Symptoms Reviewed: No  symptoms reported        Psychosocial Psychosocial Symptoms Reported: No symptoms reported         There were no vitals filed for this visit.  Medications Reviewed Today     Reviewed by Aura Mliss LABOR, RN (Registered Nurse) on 11/03/23 at 1426  Med List Status: <None>   Medication Order Taking? Sig Documenting Provider Last Dose Status Informant  alendronate  (FOSAMAX ) 70 MG tablet [id number]  Take 1 tablet (70 mg total) by mouth every 7 (seven) days. Take with a full glass of water on an empty stomach. Jolinda Potter M, DO  Active   Alpha-Lipoic Acid 600 MG CAPS [id number]  Take 1 capsule (600 mg total) by mouth daily. For diabetic neuropathy Jolinda Potter HERO, DO  Active            Med Note ADALBERTO, LYNNE G   Wed Aug 26, 2023 10:40 AM)    amLODipine (NORVASC) 5 MG tablet [id number]  Take 5 mg by mouth daily. [provider]  Active   aspirin  EC 81 MG tablet [id number]  Take 81 mg by mouth in the morning and at bedtime. Swallow whole.  Patient not taking: Reported on 10/28/2023   [provider]  Active   atorvastatin  (LIPITOR) 40 MG tablet [id number]  TAKE 1 TABLET BY MOUTH EVERYDAY AT BEDTIME Jolinda Potter M, DO  Active   Blood Glucose Monitoring Suppl (ONE TOUCH ULTRA 2) w/Device KIT [id number]  UAD to check BGs E11.9 Jolinda Potter M, DO  Active   Cholecalciferol (GNP VITAMIN D ) 25 MCG (1000 UT) tablet [id number]  Take 1,000 Units by mouth daily. [provider]  Active   EPINEPHrine 0.3 mg/0.3 mL IJ SOAJ injection [id number]  Inject 0.3 mg into the muscle as needed. [provider]  Active   glipiZIDE  (GLUCOTROL  XL) 5 MG 24 hr tablet [id number]  TAKE 1 TABLET (5 MG TOTAL) BY MOUTH DAILY WITH BREAKFAST. FOR FASTING SUGAR >200. Jolinda Potter M, DO  Active   ibuprofen (ADVIL) 800 MG tablet [id number]  Take 800 mg by mouth every 8 (eight) hours as needed for moderate pain (pain score 4-6). [provider]  Active   lisinopril  (ZESTRIL )  10 MG tablet [id number]  TAKE 1 TABLET (10 MG TOTAL) BY MOUTH IN THE MORNING AND AT BEDTIME. (STOP TAKING LISINOPRIL  20MG )  Patient not taking: Reported on 10/28/2023   Jolinda Potter M, DO  Active   metFORMIN  (GLUCOPHAGE -XR) 500 MG 24 hr tablet [id number]  Take 2 tablets (1,000 mg total) by mouth daily with breakfast. Jolinda Potter M, DO  Active   metoprolol  tartrate (LOPRESSOR ) 25 MG tablet [id number]  Take 1 tablet (25 mg total) by mouth 2 (two) times daily. Jolinda Potter HERO, DO  Active   OneTouch Delica Lancets 30G [place] [id number]  Check BS BID Dx E11.40 Jolinda Potter HERO, DO  Active   [facility] ULTRA test strip [id number]  CHECK BLOOD SUGAR TWICE DAILY DX 11.40 Jolinda Potter M, DO  Active   pramipexole  (MIRAPEX ) 0.25 MG tablet [id number]  Take 1 tablet 2 hours before bedtime for restless legs Jolinda Potter M, DO  Active   Semaglutide , 1 MG/DOSE, 4 MG/3ML SOPN [id number]  Inject 1 mg as directed every 7 (seven) days. Jolinda Potter HERO, DO  Active            Med Note TENA, Crissy Mccreadie D   Thu Apr 09, 2023  8:51 AM) Via [employer] patient assistance program    TURMERIC PO [id number]  Take 1 capsule by mouth 2 (two) times daily.  [provider]  Active Self  zolpidem  (AMBIEN ) 5 MG tablet [id number]  take 1 tablet(5 mg total) by mouth at bedtime as needed for sleep. Put on file Jolinda Potter M, DO  Active             Goals Addressed             This Visit's Progress    VBCI Transitions of Care (TOC) Care Plan       Problems:  Recent Hospitalization for treatment of anaphylaxis Pt saw primary care provider 10/28/23, will be seeing allergist on 11/20/23, no new concerns reported  Goal:  Over the next 30 days, the patient will not experience hospital readmission  Interventions:  Evaluation of current treatment plan related to anaphylaxis,  self-management and patient's adherence to plan as established by provider. Discussed plans with patient for ongoing care  management follow up and provided patient with direct contact information for care management team Evaluation of current treatment plan related to anaphylaxis and patient's adherence to plan as established by provider Discussed plans with patient for ongoing care management follow up and provided patient with direct contact information for care management team Reinforced signs/ symptoms of anaphylaxis, reportable signs/ symptoms, importance of using epi pen as prescribed, seeking emergency assistance as needed  Patient Self Care Activities:  Attend all scheduled provider appointments Attend church or other social activities Call pharmacy for medication refills 3-7 days in advance of running out of medications Call provider office for new concerns or questions  Notify RN Care Manager of TOC call rescheduling needs Participate in Transition of Care Program/Attend TOC scheduled calls Perform all self care activities  independently  Perform IADL's (shopping, preparing meals, housekeeping, managing finances) independently Take medications as prescribed    Plan:  Telephone follow up appointment with care management team member scheduled for:  11/10/23 @ 215 pm The patient has been provided with contact information for the care management team and has been advised to call with any health related questions or concerns.         Recommendation:   PCP Follow-up Specialty provider follow-up allergist 11/20/23  Follow Up Plan:   Telephone follow-up 11/10/23 @ 215 pm  Mliss Creed [facility], BSN RN Care Manager/ Transition of Care [place]/ [facility] [phone]

## 2023-11-05 ENCOUNTER — Telehealth: Admitting: *Deleted

## 2023-11-10 ENCOUNTER — Other Ambulatory Visit: Payer: Self-pay | Admitting: *Deleted

## 2023-11-10 NOTE — Patient Instructions (Signed)
 Visit Information  Thank you for taking time to visit with me today. Please don't hesitate to contact me if I can be of assistance to you before our next scheduled telephone appointment.  Our next appointment is by telephone on 11/18/23 @ 215 pm  Following is a copy of your care plan:   Goals Addressed             This Visit's Progress    VBCI Transitions of Care (TOC) Care Plan       Problems:  Recent Hospitalization for treatment of anaphylaxis Pt saw primary care provider 10/28/23, will be seeing allergist on 11/20/23, no new concerns reported  Goal:  Over the next 30 days, the patient will not experience hospital readmission  Interventions:  Evaluation of current treatment plan related to anaphylaxis,  self-management and patient's adherence to plan as established by provider. Discussed plans with patient for ongoing care management follow up and provided patient with direct contact information for care management team Evaluation of current treatment plan related to anaphylaxis and patient's adherence to plan as established by provider Discussed plans with patient for ongoing care management follow up and provided patient with direct contact information for care management team Reviewed signs/ symptoms of anaphylaxis, reportable signs/ symptoms, importance of using epi pen as prescribed, seeking emergency assistance as needed Reviewed carbohydrate modified diet  Patient Self Care Activities:  Attend all scheduled provider appointments Attend church or other social activities Call pharmacy for medication refills 3-7 days in advance of running out of medications Call provider office for new concerns or questions  Notify RN Care Manager of TOC call rescheduling needs Participate in Transition of Care Program/Attend TOC scheduled calls Perform all self care activities independently  Perform IADL's (shopping, preparing meals, housekeeping, managing finances) independently Take  medications as prescribed    Plan:  Telephone follow up appointment with care management team member scheduled for:  11/18/23 @ 215 pm The patient has been provided with contact information for the care management team and has been advised to call with any health related questions or concerns.         Patient verbalizes understanding of instructions and care plan provided today and agrees to view in MyChart. Active MyChart status and patient understanding of how to access instructions and care plan via MyChart confirmed with patient.     Telephone follow up appointment with care management team member scheduled for: 11/18/23 @ 215 pm  Please call the care guide team at [phone] if you need to cancel or reschedule your appointment.   Please call the Suicide and Crisis Lifeline: 988 call the [place]  National Suicide Prevention Lifeline: 404 805 1937 or TTY: (952)876-5378 TTY 832-061-3177) to talk to a trained counselor call 1-800-273-TALK (toll free, 24 hour hotline) go to [facility] [address], [place] [phone]) call the [facility] Crisis Line: [phone] call 911 if you are experiencing a Mental Health or Behavioral Health Crisis or need someone to talk to.  Mliss Creed [facility], BSN RN Care Manager/ Transition of Care [place]/ [facility] [phone]

## 2023-11-10 NOTE — Patient Outreach (Signed)
 Transition of Care week 3  Visit Note  11/10/2023  Name: Candace Lopez MRN: [id number]          DOB: 30-Jan-1950  Situation: Patient enrolled in [facility] 30-day program. Visit completed with patient by telephone.   Background:  Discharge Date and Diagnosis: 10/26/23, Diagnoses   Anaphylaxis,   Past Medical History:  Diagnosis Date   DDD (degenerative disc disease), lumbar    Diabetes mellitus without complication (HCC)    GERD (gastroesophageal reflux disease)    Hyperlipidemia    Hypertension    Urge incontinence of urine     Assessment: Patient Reported Symptoms: Cognitive Cognitive Status: No symptoms reported, Able to follow simple commands, Alert and oriented to person, place, and time, Normal speech and language skills      Neurological Neurological Review of Symptoms: No symptoms reported    HEENT HEENT Symptoms Reported: No symptoms reported      Cardiovascular Cardiovascular Symptoms Reported: No symptoms reported    Respiratory Respiratory Symptoms Reported: No symptoms reported    Endocrine Endocrine Symptoms Reported: No symptoms reported Is patient diabetic?: Yes Is patient checking blood sugars at home?: Yes List most recent blood sugar readings, include date and time of day: FBS today 160 Endocrine Self-Management Outcome: 4 (good) Endocrine Comment: reviewed carbohydrate modified diet  Gastrointestinal Gastrointestinal Symptoms Reported: No symptoms reported      Genitourinary Genitourinary Symptoms Reported: No symptoms reported    Integumentary Integumentary Symptoms Reported: No symptoms reported Skin Comment: reinforced importance of having epipen on hand and using as instructed, seeking emergency care as needed  Musculoskeletal Musculoskelatal Symptoms Reviewed: No symptoms reported        Psychosocial Psychosocial Symptoms Reported: No symptoms reported         There were no vitals filed for this visit.  Medications Reviewed Today      Reviewed by Aura Mliss LABOR, RN (Registered Nurse) on 11/10/23 at 1416  Med List Status: <None>   Medication Order Taking? Sig Documenting Provider Last Dose Status Informant  alendronate  (FOSAMAX ) 70 MG tablet [id number]  Take 1 tablet (70 mg total) by mouth every 7 (seven) days. Take with a full glass of water on an empty stomach. Jolinda Potter M, DO  Active   Alpha-Lipoic Acid 600 MG CAPS [id number]  Take 1 capsule (600 mg total) by mouth daily. For diabetic neuropathy Jolinda Potter HERO, DO  Active            Med Note ADALBERTO, LYNNE G   Wed Aug 26, 2023 10:40 AM)    amLODipine (NORVASC) 5 MG tablet [id number]  Take 5 mg by mouth daily. [provider]  Active   aspirin  EC 81 MG tablet [id number]  Take 81 mg by mouth in the morning and at bedtime. Swallow whole.  Patient not taking: Reported on 10/28/2023   [provider]  Active   atorvastatin  (LIPITOR) 40 MG tablet [id number]  TAKE 1 TABLET BY MOUTH EVERYDAY AT BEDTIME Jolinda Potter M, DO  Active   Blood Glucose Monitoring Suppl (ONE TOUCH ULTRA 2) w/Device KIT [id number]  UAD to check BGs E11.9 Jolinda Potter M, DO  Active   Cholecalciferol (GNP VITAMIN D ) 25 MCG (1000 UT) tablet [id number]  Take 1,000 Units by mouth daily. [provider]  Active   EPINEPHrine 0.3 mg/0.3 mL IJ SOAJ injection [id number]  Inject 0.3 mg into the muscle as needed. [provider]  Active   glipiZIDE  (GLUCOTROL  XL) 5 MG 24 hr tablet  [id number]  TAKE 1 TABLET (5 MG TOTAL) BY MOUTH DAILY WITH BREAKFAST. FOR FASTING SUGAR >200. Jolinda Potter M, DO  Active   ibuprofen (ADVIL) 800 MG tablet [id number]  Take 800 mg by mouth every 8 (eight) hours as needed for moderate pain (pain score 4-6). [provider]  Active   lisinopril  (ZESTRIL ) 10 MG tablet [id number]  TAKE 1 TABLET (10 MG TOTAL) BY MOUTH IN THE MORNING AND AT BEDTIME. (STOP TAKING LISINOPRIL  20MG )  Patient not taking: Reported on 10/28/2023   Jolinda Potter  M, DO  Active   metFORMIN  (GLUCOPHAGE -XR) 500 MG 24 hr tablet [id number]  Take 2 tablets (1,000 mg total) by mouth daily with breakfast. Jolinda Potter M, DO  Active   metoprolol  tartrate (LOPRESSOR ) 25 MG tablet [id number]  Take 1 tablet (25 mg total) by mouth 2 (two) times daily. Jolinda Potter HERO, DO  Active   OneTouch Delica Lancets 30G [place] [id number]  Check BS BID Dx E11.40 Jolinda Potter HERO, DO  Active   [facility] ULTRA test strip [id number]  CHECK BLOOD SUGAR TWICE DAILY DX 11.40 Jolinda Potter M, DO  Active   pramipexole  (MIRAPEX ) 0.25 MG tablet [id number]  Take 1 tablet 2 hours before bedtime for restless legs Jolinda Potter M, DO  Active   Semaglutide , 1 MG/DOSE, 4 MG/3ML SOPN [id number]  Inject 1 mg as directed every 7 (seven) days. Jolinda Potter HERO, DO  Active            Med Note TENA, Donnah Levert D   Thu Apr 09, 2023  8:51 AM) Via [employer] patient assistance program    TURMERIC PO [id number]  Take 1 capsule by mouth 2 (two) times daily.  [provider]  Active Self  zolpidem  (AMBIEN ) 5 MG tablet [id number]  take 1 tablet(5 mg total) by mouth at bedtime as needed for sleep. Put on file Jolinda Potter M, DO  Active             Goals Addressed             This Visit's Progress    VBCI Transitions of Care (TOC) Care Plan       Problems:  Recent Hospitalization for treatment of anaphylaxis Pt saw primary care provider 10/28/23, will be seeing allergist on 11/20/23, no new concerns reported  Goal:  Over the next 30 days, the patient will not experience hospital readmission  Interventions:  Evaluation of current treatment plan related to anaphylaxis,  self-management and patient's adherence to plan as established by provider. Discussed plans with patient for ongoing care management follow up and provided patient with direct contact information for care management team Evaluation of current treatment plan related to anaphylaxis and patient's adherence to  plan as established by provider Discussed plans with patient for ongoing care management follow up and provided patient with direct contact information for care management team Reviewed signs/ symptoms of anaphylaxis, reportable signs/ symptoms, importance of using epi pen as prescribed, seeking emergency assistance as needed Reviewed carbohydrate modified diet  Patient Self Care Activities:  Attend all scheduled provider appointments Attend church or other social activities Call pharmacy for medication refills 3-7 days in advance of running out of medications Call provider office for new concerns or questions  Notify RN Care Manager of TOC call rescheduling needs Participate in Transition of Care Program/Attend TOC scheduled calls Perform all self care activities independently  Perform IADL's (shopping, preparing meals, housekeeping, managing finances) independently Take medications as prescribed    Plan:  Telephone follow up appointment with care management team member scheduled for:  11/18/23 @ 215 pm The patient has been provided with contact information for the care management team and has been advised to call with any health related questions or concerns.         Recommendation:   PCP Follow-up  Follow Up Plan:   Telephone follow-up 11/18/23 @ 215 pm  Mliss Creed [facility], BSN RN Care Manager/ Transition of Care [place]/ [facility] [phone]

## 2023-11-18 ENCOUNTER — Other Ambulatory Visit: Payer: Self-pay | Admitting: Family Medicine

## 2023-11-18 ENCOUNTER — Other Ambulatory Visit: Payer: Self-pay | Admitting: *Deleted

## 2023-11-18 DIAGNOSIS — E119 Type 2 diabetes mellitus without complications: Secondary | ICD-10-CM

## 2023-11-18 NOTE — Patient Outreach (Signed)
 Transition of Care week 4  Visit Note  11/18/2023  Name: Candace Lopez MRN: [id number]          DOB: November 02, 1950  Situation: Patient enrolled in [facility] 30-day program. Visit completed with patient by telephone.   Background:  Discharge Date and Diagnosis: 10/26/23, Diagnoses   Anaphylaxis,   Past Medical History:  Diagnosis Date   DDD (degenerative disc disease), lumbar    Diabetes mellitus without complication (HCC)    GERD (gastroesophageal reflux disease)    Hyperlipidemia    Hypertension    Urge incontinence of urine     Assessment: Patient Reported Symptoms: Cognitive Cognitive Status: No symptoms reported, Alert and oriented to person, place, and time, Normal speech and language skills, Able to follow simple commands      Neurological Neurological Review of Symptoms: No symptoms reported    HEENT HEENT Symptoms Reported: No symptoms reported      Cardiovascular Cardiovascular Symptoms Reported: No symptoms reported    Respiratory Respiratory Symptoms Reported: No symptoms reported    Endocrine Endocrine Symptoms Reported: No symptoms reported Is patient diabetic?: Yes Is patient checking blood sugars at home?: Yes List most recent blood sugar readings, include date and time of day: FBS 196 this morning after eating alot of bread for dinner last night Endocrine Self-Management Outcome: 4 (good) Endocrine Comment: reinforced carbohydrate modified diet, reviewed foods high in carbohydrates to limit  Gastrointestinal Gastrointestinal Symptoms Reported: No symptoms reported      Genitourinary Genitourinary Symptoms Reported: No symptoms reported    Integumentary Integumentary Symptoms Reported: No symptoms reported    Musculoskeletal Musculoskelatal Symptoms Reviewed: No symptoms reported        Psychosocial Psychosocial Symptoms Reported: No symptoms reported         There were no vitals filed for this visit.  Medications Reviewed Today     Reviewed by  Aura Mliss LABOR, RN (Registered Nurse) on 11/18/23 at 1401  Med List Status: <None>   Medication Order Taking? Sig Documenting Provider Last Dose Status Informant  alendronate  (FOSAMAX ) 70 MG tablet [id number]  Take 1 tablet (70 mg total) by mouth every 7 (seven) days. Take with a full glass of water on an empty stomach. Jolinda Potter M, DO  Active   Alpha-Lipoic Acid 600 MG CAPS [id number]  Take 1 capsule (600 mg total) by mouth daily. For diabetic neuropathy Jolinda Potter HERO, DO  Active            Med Note ADALBERTO, LYNNE G   Wed Aug 26, 2023 10:40 AM)    amLODipine (NORVASC) 5 MG tablet [id number]  Take 5 mg by mouth daily. [provider]  Active   aspirin  EC 81 MG tablet [id number]  Take 81 mg by mouth in the morning and at bedtime. Swallow whole.  Patient not taking: Reported on 10/28/2023   [provider]  Active   atorvastatin  (LIPITOR) 40 MG tablet [id number]  TAKE 1 TABLET BY MOUTH EVERYDAY AT BEDTIME Jolinda Potter M, DO  Active   Blood Glucose Monitoring Suppl (ONE TOUCH ULTRA 2) w/Device KIT [id number]  UAD to check BGs E11.9 Jolinda Potter M, DO  Active   Cholecalciferol (GNP VITAMIN D ) 25 MCG (1000 UT) tablet [id number]  Take 1,000 Units by mouth daily. [provider]  Active   EPINEPHrine 0.3 mg/0.3 mL IJ SOAJ injection [id number]  Inject 0.3 mg into the muscle as needed. [provider]  Active   glipiZIDE  (GLUCOTROL  XL) 5 MG 24 hr  tablet [id number]  TAKE 1 TABLET (5 MG TOTAL) BY MOUTH DAILY WITH BREAKFAST. FOR FASTING SUGAR >200. Jolinda Potter M, DO  Active   ibuprofen (ADVIL) 800 MG tablet [id number]  Take 800 mg by mouth every 8 (eight) hours as needed for moderate pain (pain score 4-6). [provider]  Active   lisinopril  (ZESTRIL ) 10 MG tablet [id number]  TAKE 1 TABLET (10 MG TOTAL) BY MOUTH IN THE MORNING AND AT BEDTIME. (STOP TAKING LISINOPRIL  20MG )  Patient not taking: Reported on 10/28/2023   Jolinda Potter M, DO   Active   metFORMIN  (GLUCOPHAGE -XR) 500 MG 24 hr tablet [id number]  TAKE 2 TABLETS BY MOUTH EVERY DAY WITH BREAKFAST Jolinda Potter M, DO  Active   metoprolol  tartrate (LOPRESSOR ) 25 MG tablet [id number]  Take 1 tablet (25 mg total) by mouth 2 (two) times daily. Jolinda Potter HERO, DO  Active   OneTouch Delica Lancets 30G [place] [id number]  Check BS BID Dx E11.40 Jolinda Potter HERO, DO  Active   [facility] ULTRA test strip [id number]  CHECK BLOOD SUGAR TWICE DAILY DX 11.40 Jolinda Potter M, DO  Active   pramipexole  (MIRAPEX ) 0.25 MG tablet [id number]  Take 1 tablet 2 hours before bedtime for restless legs Jolinda Potter M, DO  Active   Semaglutide , 1 MG/DOSE, 4 MG/3ML SOPN [id number]  Inject 1 mg as directed every 7 (seven) days. Jolinda Potter HERO, DO  Active            Med Note TENA, Tyronn Golda D   Thu Apr 09, 2023  8:51 AM) Via [employer] patient assistance program    TURMERIC PO [id number]  Take 1 capsule by mouth 2 (two) times daily.  [provider]  Active Self  zolpidem  (AMBIEN ) 5 MG tablet [id number]  take 1 tablet(5 mg total) by mouth at bedtime as needed for sleep. Put on file Jolinda Potter M, DO  Active             Goals Addressed             This Visit's Progress    VBCI Transitions of Care (TOC) Care Plan       Problems:  Recent Hospitalization for treatment of anaphylaxis Pt saw primary care provider 10/28/23, will be seeing allergist on 11/20/23, no new concerns reported  Goal:  Over the next 30 days, the patient will not experience hospital readmission  Interventions:  Evaluation of current treatment plan related to anaphylaxis,  self-management and patient's adherence to plan as established by provider. Discussed plans with patient for ongoing care management follow up and provided patient with direct contact information for care management team Evaluation of current treatment plan related to anaphylaxis and patient's adherence to plan as established  by provider Discussed plans with patient for ongoing care management follow up and provided patient with direct contact information for care management team Reinforced signs/ symptoms of anaphylaxis, reportable signs/ symptoms, importance of using epi pen as prescribed, seeking emergency assistance as needed Reinforced carbohydrate modified diet, foods high in carbohydrates to limit  Patient Self Care Activities:  Attend all scheduled provider appointments Attend church or other social activities Call pharmacy for medication refills 3-7 days in advance of running out of medications Call provider office for new concerns or questions  Notify RN Care Manager of TOC call rescheduling needs Participate in Transition of Care Program/Attend TOC scheduled calls Perform all self care activities independently  Perform IADL's (shopping, preparing meals, housekeeping, managing finances) independently Take medications as  prescribed    Plan:  Telephone follow up appointment with care management team member scheduled for:  11/25/23/ @ 115 pm The patient has been provided with contact information for the care management team and has been advised to call with any health related questions or concerns.         Recommendation:   PCP Follow-up  Follow Up Plan:   Telephone follow-up 11/25/23 @ 115 pm  Mliss Creed [facility], BSN RN Care Manager/ Transition of Care [place]/ [facility] [phone]

## 2023-11-18 NOTE — Patient Instructions (Signed)
 Visit Information  Thank you for taking time to visit with me today. Please don't hesitate to contact me if I can be of assistance to you before our next scheduled telephone appointment.  Our next appointment is by telephone on 11/25/23 @ 115 pm  Following is a copy of your care plan:   Goals Addressed             This Visit's Progress    VBCI Transitions of Care (TOC) Care Plan       Problems:  Recent Hospitalization for treatment of anaphylaxis Pt saw primary care provider 10/28/23, will be seeing allergist on 11/20/23, no new concerns reported  Goal:  Over the next 30 days, the patient will not experience hospital readmission  Interventions:  Evaluation of current treatment plan related to anaphylaxis,  self-management and patient's adherence to plan as established by provider. Discussed plans with patient for ongoing care management follow up and provided patient with direct contact information for care management team Evaluation of current treatment plan related to anaphylaxis and patient's adherence to plan as established by provider Discussed plans with patient for ongoing care management follow up and provided patient with direct contact information for care management team Reinforced signs/ symptoms of anaphylaxis, reportable signs/ symptoms, importance of using epi pen as prescribed, seeking emergency assistance as needed Reinforced carbohydrate modified diet, foods high in carbohydrates to limit  Patient Self Care Activities:  Attend all scheduled provider appointments Attend church or other social activities Call pharmacy for medication refills 3-7 days in advance of running out of medications Call provider office for new concerns or questions  Notify RN Care Manager of TOC call rescheduling needs Participate in Transition of Care Program/Attend TOC scheduled calls Perform all self care activities independently  Perform IADL's (shopping, preparing meals, housekeeping,  managing finances) independently Take medications as prescribed    Plan:  Telephone follow up appointment with care management team member scheduled for:  11/25/23/ @ 115 pm The patient has been provided with contact information for the care management team and has been advised to call with any health related questions or concerns.         Patient verbalizes understanding of instructions and care plan provided today and agrees to view in MyChart. Active MyChart status and patient understanding of how to access instructions and care plan via MyChart confirmed with patient.     Telephone follow up appointment with care management team member scheduled for:   11/25/23 @ 115 pm Please call the care guide team at [phone] if you need to cancel or reschedule your appointment.   Please call the Suicide and Crisis Lifeline: 988 call the [place]  National Suicide Prevention Lifeline: 236-727-9944 or TTY: (843)132-9782 TTY 702-117-1603) to talk to a trained counselor call 1-800-273-TALK (toll free, 24 hour hotline) go to [facility] [address], [place] [phone]) call the [facility] Crisis Line: [phone] call 911 if you are experiencing a Mental Health or Behavioral Health Crisis or need someone to talk to.  Mliss Creed [facility], BSN RN Care Manager/ Transition of Care [place]/ [facility] [phone]

## 2023-11-20 ENCOUNTER — Encounter: Payer: Self-pay | Admitting: Internal Medicine

## 2023-11-20 ENCOUNTER — Ambulatory Visit: Admitting: Internal Medicine

## 2023-11-20 ENCOUNTER — Other Ambulatory Visit: Payer: Self-pay

## 2023-11-20 VITALS — BP 128/70 | HR 98 | Temp 97.8°F | Ht 64.25 in | Wt 150.0 lb

## 2023-11-20 DIAGNOSIS — J3089 Other allergic rhinitis: Secondary | ICD-10-CM

## 2023-11-20 DIAGNOSIS — L500 Allergic urticaria: Secondary | ICD-10-CM

## 2023-11-20 DIAGNOSIS — T782XXA Anaphylactic shock, unspecified, initial encounter: Secondary | ICD-10-CM

## 2023-11-20 DIAGNOSIS — T782XXD Anaphylactic shock, unspecified, subsequent encounter: Secondary | ICD-10-CM

## 2023-11-20 NOTE — Patient Instructions (Addendum)
 Allergic Reactions: - for SKIN only reaction (hives/swelling), okay to take Zyrtec 10 mg every 12 hours as needed - for SKIN + ANY additional symptoms, OR IF concern for LIFE THREATENING reaction = Epipen Autoinjector EpiPen 0.3 mg. - If using Epinephrine autoinjector, call 911 or go to the ER.   Other Allergic Rhinitis: - Use nasal saline rinses before nose sprays such as with Neilmed Sinus Rinse.  Use distilled water.   - Use Zyrtec 10 mg daily as needed for runny nose, sneezing, itchy watery eyes.   Hold all anti-histamines (Xyzal, Allegra, Zyrtec, Claritin, Benadryl, Pepcid ) 3 days prior to next visit.   Follow up: 10/31 at 9 AM for skin testing 1-68 + chicken

## 2023-11-20 NOTE — Progress Notes (Signed)
 NEW PATIENT  Date of Service/Encounter:  11/20/23  Consult requested by: Jolinda Norene HERO, DO   Subjective:   Candace Lopez (DOB: Oct 06, 1950) is a 73 y.o. female who presents to the clinic on 11/20/2023 with a chief complaint of Allergic Reaction (Anaphylaxis reaction after eating fried chicken wings.) .    History obtained from: chart review and patient.   Allergic Reaction:  9/27 had an allergic reaction.  Reports not feeling well for the past 3-4 days with fatigue and malaise.   Then ate [place]  fried chicken/gravy/mashed potato and developed tongue/lip swelling, hives/itching all over, took a cold shower and passed out.  Called ambulance, given Epi and admitted at [facility]. Treated for anaphylaxis, stopped ASA and lisinopril .  Of note, she also had fever, lactic acidosis, leukocytosis, hypotension on initial labs and exam and was empirically started on abx.  Repeat labs normal.   Notes having hives and dizziness/lightheadedness in the past on Sept 1 when mowing the lawn.  Called 911 and vitals were fine and started feeling better after benadryl, never went to ER.     Rhinitis:  Started many years ago.   Symptoms include: nasal congestion, rhinorrhea, and post nasal drainage  Occurs seasonally-Spring/Fall  Potential triggers: pollens- grasses Treatments tried:  PRN anti histamines  Previous allergy testing: no History of sinus surgery: no Nonallergic triggers: none   Reviewed:  Hospitalization 9-27 to 9-29 reviewed in HPI.   09/01/2023: Seen by PCP for DM, HTN, insomnia on lisinopril , lipitor, ozempic , metformin , glipizide , ambien .  03/25/2023: seen for dizziness, body aches, ear soreness/pressure, discussed possible viral cause. F/u with PCP.    Past Medical History: Past Medical History:  Diagnosis Date   DDD (degenerative disc disease), lumbar    Diabetes mellitus without complication (HCC)    GERD (gastroesophageal reflux disease)    Hyperlipidemia     Hypertension    Urge incontinence of urine      Past Surgical History: Past Surgical History:  Procedure Laterality Date   ABDOMINAL HYSTERECTOMY     arm surgery     COLONOSCOPY N/A 09/22/2018   Procedure: COLONOSCOPY;  Surgeon: Golda Claudis PENNER, MD;  Location: [facility];  Service: Endoscopy;  Laterality: N/A;  830   FOOT SURGERY     KNEE SURGERY      Family History: Family History  Problem Relation Age of Onset   Cancer Mother        lung cancer   Cancer Father        lymphnodes   Diabetes Sister    Lung cancer Sister    Diabetes Brother    Cancer Maternal Grandfather    Heart disease Paternal Grandfather    Diabetes Son    Cancer Maternal Aunt        throat   Cancer Paternal Uncle        unknown    Cancer Other        breast   Colon cancer Neg Hx     Social History:  Flooring in bedroom: laminate Pets: none Tobacco use/exposure: none Job: retired   Medication List:  Allergies as of 11/20/2023   No Active Allergies      Medication List        Accurate as of November 20, 2023 12:04 PM. If you have any questions, ask your nurse or doctor.          alendronate  70 MG tablet Commonly known as: FOSAMAX  Take 1 tablet (70 mg total) by  mouth every 7 (seven) days. Take with a full glass of water on an empty stomach.   Alpha-Lipoic Acid 600 MG Caps Take 1 capsule (600 mg total) by mouth daily. For diabetic neuropathy   amLODipine 5 MG tablet Commonly known as: NORVASC Take 5 mg by mouth daily.   aspirin  EC 81 MG tablet Take 81 mg by mouth in the morning and at bedtime. Swallow whole.   atorvastatin  40 MG tablet Commonly known as: LIPITOR TAKE 1 TABLET BY MOUTH EVERYDAY AT BEDTIME   EPINEPHrine 0.3 mg/0.3 mL Soaj injection Commonly known as: EPI-PEN Inject 0.3 mg into the muscle as needed.   glipiZIDE  5 MG 24 hr tablet Commonly known as: GLUCOTROL  XL TAKE 1 TABLET (5 MG TOTAL) BY MOUTH DAILY WITH BREAKFAST. FOR FASTING SUGAR >200.   GNP  Vitamin D  25 MCG (1000 UT) tablet Generic drug: Cholecalciferol Take 1,000 Units by mouth daily.   ibuprofen 800 MG tablet Commonly known as: ADVIL Take 800 mg by mouth every 8 (eight) hours as needed for moderate pain (pain score 4-6).   lisinopril  10 MG tablet Commonly known as: ZESTRIL  TAKE 1 TABLET (10 MG TOTAL) BY MOUTH IN THE MORNING AND AT BEDTIME. (STOP TAKING LISINOPRIL  20MG )   metFORMIN  500 MG 24 hr tablet Commonly known as: GLUCOPHAGE -XR TAKE 2 TABLETS BY MOUTH EVERY DAY WITH BREAKFAST   metoprolol  tartrate 25 MG tablet Commonly known as: LOPRESSOR  Take 1 tablet (25 mg total) by mouth 2 (two) times daily.   ONE TOUCH ULTRA 2 w/Device Kit UAD to check BGs E11.9   OneTouch Delica Lancets 30G Misc Check BS BID Dx E11.40   OneTouch Ultra test strip Generic drug: glucose blood CHECK BLOOD SUGAR TWICE DAILY DX 11.40   pramipexole  0.25 MG tablet Commonly known as: MIRAPEX  Take 1 tablet 2 hours before bedtime for restless legs   Semaglutide  (1 MG/DOSE) 4 MG/3ML Sopn Inject 1 mg as directed every 7 (seven) days.   TURMERIC PO Take 1 capsule by mouth 2 (two) times daily.   zolpidem  5 MG tablet Commonly known as: AMBIEN  take 1 tablet(5 mg total) by mouth at bedtime as needed for sleep. Put on file         REVIEW OF SYSTEMS: Pertinent positives and negatives discussed in HPI.   Objective:   Physical Exam: BP 128/70 (BP Location: Right Arm, Patient Position: Sitting, Cuff Size: Normal)   Pulse 98   Temp 97.8 F (36.6 C) (Temporal)   Ht 5' 4.25 (1.632 m)   Wt 150 lb (68 kg)   SpO2 98%   BMI 25.55 kg/m  Body mass index is 25.55 kg/m. GEN: alert, well developed HEENT: clear conjunctiva, nose with + mild inferior turbinate hypertrophy, pink nasal mucosa, slight clear rhinorrhea, + cobblestoning HEART: regular rate and rhythm, no murmur LUNGS: clear to auscultation bilaterally, no coughing, unlabored respiration ABDOMEN: soft, non distended  SKIN: no  rashes or lesions   Assessment:   1. Allergic urticaria   2. Other allergic rhinitis   3. Anaphylaxis, initial encounter     Plan/Recommendations:  Allergic Reactions: - Unclear etiology, will check tryptase and alpha gal.   - Initial rxns: 09/2023 with itching, hives, swelling, dizziness/LOC. Of note, initial rxn also notes fever/leukocytosis/ - for SKIN only reaction, okay to take Zyrtec 10 mg every 12 hours as needed - for SKIN + ANY additional symptoms, OR IF concern for LIFE THREATENING reaction = Epipen Autoinjector EpiPen 0.3 mg. - If using Epinephrine autoinjector, call 911  or go to the ER.   Other Allergic Rhinitis: - Due to turbinate hypertrophy, seasonal symptoms and unresponsive to over the counter meds, will perform skin testing to identify aeroallergen triggers.   - Use nasal saline rinses before nose sprays such as with Neilmed Sinus Rinse.  Use distilled water.   - Use Zyrtec 10 mg daily as needed for runny nose, sneezing, itchy watery eyes.   Hold all anti-histamines (Xyzal, Allegra, Zyrtec, Claritin, Benadryl, Pepcid ) 3 days prior to next visit.   Follow up: 10/31 at 9 AM for skin testing 1-68 + chicken + potato    No follow-ups on file.  Arleta Blanch, MD [facility] 

## 2023-11-25 ENCOUNTER — Other Ambulatory Visit: Payer: Self-pay | Admitting: *Deleted

## 2023-11-25 NOTE — Patient Instructions (Signed)
 Visit Information  Thank you for taking time to visit with me today. Please don't hesitate to contact me if I can be of assistance to you before our next scheduled telephone appointment.  Our next appointment is no further scheduled appointments.    Following is a copy of your care plan:   Goals Addressed             This Visit's Progress    COMPLETED: VBCI Transitions of Care (TOC) Care Plan       Problems:  Recent Hospitalization for treatment of anaphylaxis Pt saw primary care provider 10/28/23, saw allergist on 11/20/23, will follow up on 11/27/23, no new concerns reported  Goal:  Over the next 30 days, the patient will not experience hospital readmission  Interventions:  Evaluation of current treatment plan related to anaphylaxis,  self-management and patient's adherence to plan as established by provider. Discussed plans with patient for ongoing care management follow up and provided patient with direct contact information for care management team Evaluation of current treatment plan related to anaphylaxis and patient's adherence to plan as established by provider Discussed plans with patient for ongoing care management follow up and provided patient with direct contact information for care management team Reviewed signs/ symptoms of anaphylaxis, reportable signs/ symptoms, importance of using epi pen as prescribed, seeking emergency assistance as needed Reviewed carbohydrate modified diet, foods high in carbohydrates to limit Reviewed plan of care with pt including TOC case closure  Patient Self Care Activities:  Attend all scheduled provider appointments Attend church or other social activities Call pharmacy for medication refills 3-7 days in advance of running out of medications Call provider office for new concerns or questions  Notify RN Care Manager of TOC call rescheduling needs Participate in Transition of Care Program/Attend TOC scheduled calls Perform all self  care activities independently  Perform IADL's (shopping, preparing meals, housekeeping, managing finances) independently Take medications as prescribed   TOC case closure  Plan:  No further follow up required: case closed The patient has been provided with contact information for the care management team and has been advised to call with any health related questions or concerns.         Care plan and visit instructions communicated with the patient verbally today. Patient agrees to receive a copy in MyChart. Active MyChart status and patient understanding of how to access instructions and care plan via MyChart confirmed with patient.      Please call the care guide team at [phone] if you need to cancel or reschedule your appointment.   Please call the Suicide and Crisis Lifeline: 988 call the [place]  National Suicide Prevention Lifeline: 3182686776 or TTY: 540 552 9198 TTY [phone]) to talk to a trained counselor call 1-800-273-TALK (toll free, 24 hour hotline) go to [facility] [address] [place] [phone]) call the [facility] Crisis Line: [phone] call 911 if you are experiencing a Mental Health or Behavioral Health Crisis or need someone to talk to.  Mliss Creed [facility], BSN RN Care Manager/ Transition of Care [place]/ [facility] [phone]

## 2023-11-25 NOTE — Patient Outreach (Signed)
 Transition of Care week 5  Visit Note  11/25/2023  Name: Candace Lopez MRN: [id number]          DOB: 07-26-1950  Situation: Patient enrolled in [facility] 30-day program. Visit completed with patient by telephone.   Background:  Discharge Date and Diagnosis: 10/26/23, Diagnoses   Anaphylaxis,   Past Medical History:  Diagnosis Date   DDD (degenerative disc disease), lumbar    Diabetes mellitus without complication (HCC)    GERD (gastroesophageal reflux disease)    Hyperlipidemia    Hypertension    Urge incontinence of urine     Assessment: Patient Reported Symptoms: Cognitive Cognitive Status: No symptoms reported, Able to follow simple commands, Alert and oriented to person, place, and time, Normal speech and language skills      Neurological Neurological Review of Symptoms: No symptoms reported    HEENT HEENT Symptoms Reported: No symptoms reported      Cardiovascular Cardiovascular Symptoms Reported: No symptoms reported Does patient have uncontrolled Hypertension?: No    Respiratory Respiratory Symptoms Reported: No symptoms reported    Endocrine Endocrine Symptoms Reported: No symptoms reported Is patient diabetic?: Yes Is patient checking blood sugars at home?: Yes List most recent blood sugar readings, include date and time of day: FBS 144 Endocrine Self-Management Outcome: 4 (good) Endocrine Comment: reviewed carbohydrate modified diet  Gastrointestinal Gastrointestinal Symptoms Reported: No symptoms reported      Genitourinary Genitourinary Symptoms Reported: No symptoms reported    Integumentary Integumentary Symptoms Reported: No symptoms reported Skin Comment: reviewed importance of keeping epipen on hand and using as prescribed  Musculoskeletal Musculoskelatal Symptoms Reviewed: No symptoms reported        Psychosocial Psychosocial Symptoms Reported: No symptoms reported         There were no vitals filed for this visit.  Medications Reviewed Today      Reviewed by Aura Mliss LABOR, RN (Registered Nurse) on 11/25/23 at 1327  Med List Status: <None>   Medication Order Taking? Sig Documenting Provider Last Dose Status Informant  alendronate  (FOSAMAX ) 70 MG tablet [id number]  Take 1 tablet (70 mg total) by mouth every 7 (seven) days. Take with a full glass of water on an empty stomach. Jolinda Potter M, DO  Active   Alpha-Lipoic Acid 600 MG CAPS [id number]  Take 1 capsule (600 mg total) by mouth daily. For diabetic neuropathy Jolinda Potter HERO, DO  Active            Med Note ADALBERTO, LYNNE G   Wed Aug 26, 2023 10:40 AM)    amLODipine (NORVASC) 5 MG tablet [id number]  Take 5 mg by mouth daily. [provider]  Active   aspirin  EC 81 MG tablet [id number]  Take 81 mg by mouth in the morning and at bedtime. Swallow whole.  Patient not taking: Reported on 11/20/2023   [provider]  Active   atorvastatin  (LIPITOR) 40 MG tablet [id number]  TAKE 1 TABLET BY MOUTH EVERYDAY AT BEDTIME Jolinda Potter M, DO  Active   Blood Glucose Monitoring Suppl (ONE TOUCH ULTRA 2) w/Device KIT [id number]  UAD to check BGs E11.9 Jolinda Potter M, DO  Active   Cholecalciferol (GNP VITAMIN D ) 25 MCG (1000 UT) tablet [id number]  Take 1,000 Units by mouth daily. [provider]  Active   EPINEPHrine 0.3 mg/0.3 mL IJ SOAJ injection [id number]  Inject 0.3 mg into the muscle as needed. [provider]  Active   glipiZIDE  (GLUCOTROL  XL) 5 MG 24 hr tablet  [id number]  TAKE 1 TABLET (5 MG TOTAL) BY MOUTH DAILY WITH BREAKFAST. FOR FASTING SUGAR >200.  Patient not taking: Reported on 11/20/2023   Jolinda Potter M, DO  Active   ibuprofen (ADVIL) 800 MG tablet [id number]  Take 800 mg by mouth every 8 (eight) hours as needed for moderate pain (pain score 4-6).  Patient not taking: Reported on 11/20/2023   [provider]  Active   lisinopril  (ZESTRIL ) 10 MG tablet [id number]  TAKE 1 TABLET (10 MG TOTAL) BY MOUTH IN THE MORNING AND AT  BEDTIME. (STOP TAKING LISINOPRIL  20MG ) Jolinda Potter M, DO  Active   metFORMIN  (GLUCOPHAGE -XR) 500 MG 24 hr tablet [id number]  TAKE 2 TABLETS BY MOUTH EVERY DAY WITH BREAKFAST Jolinda Potter M, DO  Active   metoprolol  tartrate (LOPRESSOR ) 25 MG tablet [id number]  Take 1 tablet (25 mg total) by mouth 2 (two) times daily. Jolinda Potter HERO, DO  Active   OneTouch Delica Lancets 30G [place] [id number]  Check BS BID Dx E11.40 Jolinda Potter HERO, DO  Active   [facility] ULTRA test strip [id number]  CHECK BLOOD SUGAR TWICE DAILY DX 11.40 Jolinda Potter M, DO  Active   pramipexole  (MIRAPEX ) 0.25 MG tablet [id number]  Take 1 tablet 2 hours before bedtime for restless legs Jolinda Potter M, DO  Active   Semaglutide , 1 MG/DOSE, 4 MG/3ML SOPN [id number]  Inject 1 mg as directed every 7 (seven) days. Jolinda Potter HERO, DO  Active            Med Note TENA, Shaneal Barasch D   Thu Apr 09, 2023  8:51 AM) Via [employer] patient assistance program    TURMERIC PO [id number]  Take 1 capsule by mouth 2 (two) times daily.  [provider]  Active Self  zolpidem  (AMBIEN ) 5 MG tablet [id number]  take 1 tablet(5 mg total) by mouth at bedtime as needed for sleep. Put on file Gottschalk, Ashly M, DO  Active             Goals Addressed             This Visit's Progress    COMPLETED: [facility] Transitions of Care (TOC) Care Plan       Problems:  Recent Hospitalization for treatment of anaphylaxis Pt saw primary care provider 10/28/23, saw allergist on 11/20/23, will follow up on 11/27/23, no new concerns reported  Goal:  Over the next 30 days, the patient will not experience hospital readmission  Interventions:  Evaluation of current treatment plan related to anaphylaxis,  self-management and patient's adherence to plan as established by provider. Discussed plans with patient for ongoing care management follow up and provided patient with direct contact information for care management team Evaluation of  current treatment plan related to anaphylaxis and patient's adherence to plan as established by provider Discussed plans with patient for ongoing care management follow up and provided patient with direct contact information for care management team Reviewed signs/ symptoms of anaphylaxis, reportable signs/ symptoms, importance of using epi pen as prescribed, seeking emergency assistance as needed Reviewed carbohydrate modified diet, foods high in carbohydrates to limit Reviewed plan of care with pt including TOC case closure  Patient Self Care Activities:  Attend all scheduled provider appointments Attend church or other social activities Call pharmacy for medication refills 3-7 days in advance of running out of medications Call provider office for new concerns or questions  Notify RN Care Manager of TOC call rescheduling needs Participate in Transition of Care Program/Attend  TOC scheduled calls Perform all self care activities independently  Perform IADL's (shopping, preparing meals, housekeeping, managing finances) independently Take medications as prescribed   TOC case closure  Plan:  No further follow up required: case closed The patient has been provided with contact information for the care management team and has been advised to call with any health related questions or concerns.          Recommendation:   PCP Follow-up Specialty provider follow-up allergist 11/27/23  Follow Up Plan:   Closing From:  Transitions of Care Program  Mliss Creed [facility], BSN RN Care Manager/ Transition of Care [place]/ [facility] [phone]

## 2023-11-27 ENCOUNTER — Ambulatory Visit: Admitting: Internal Medicine

## 2023-11-27 DIAGNOSIS — L500 Allergic urticaria: Secondary | ICD-10-CM

## 2023-11-27 DIAGNOSIS — J301 Allergic rhinitis due to pollen: Secondary | ICD-10-CM | POA: Diagnosis not present

## 2023-11-27 LAB — ALPHA-GAL PANEL
Allergen Lamb IgE: 0.29 kU/L — AB
Beef IgE: 0.52 kU/L — AB
IgE (Immunoglobulin E), Serum: 1423 [IU]/mL — ABNORMAL HIGH (ref 6–495)
O215-IgE Alpha-Gal: 1.02 kU/L — AB
Pork IgE: 0.21 kU/L — AB

## 2023-11-27 LAB — CHRONIC URTICARIA PD-BAT: Pooled Donor- BAT CU: 2.7 % (ref 0.00–10.60)

## 2023-11-27 LAB — TSH: TSH: 2.71 u[IU]/mL (ref 0.450–4.500)

## 2023-11-27 LAB — ANA W/REFLEX: Anti Nuclear Antibody (ANA): NEGATIVE

## 2023-11-27 LAB — TRYPTASE: Tryptase: 10.7 ug/L (ref 2.2–13.2)

## 2023-11-27 MED ORDER — FLUTICASONE PROPIONATE 50 MCG/ACT NA SUSP: 2.0000 | Freq: Every day | NASAL | 5 refills | Status: AC

## 2023-11-27 MED ORDER — EPINEPHRINE 0.3 MG/0.3ML IJ SOAJ: 0.3000 mg | INTRAMUSCULAR | 1 refills | Status: AC | PRN

## 2023-11-27 NOTE — Progress Notes (Signed)
 FOLLOW UP Date of Service/Encounter:  11/27/23   Subjective:  Candace Lopez (DOB: 04-10-1950) is a 73 y.o. female who [facility] for follow up for skin testing.   History obtained from: chart review and patient.  Anti histamines held.  She noted vomiting/nausea this AM; no fevers.  Usually happens due to her ozempic .  Vitals obtained prior to testing and were normal.   Past Medical History: Past Medical History:  Diagnosis Date   DDD (degenerative disc disease), lumbar    Diabetes mellitus without complication (HCC)    GERD (gastroesophageal reflux disease)    Hyperlipidemia    Hypertension    Urge incontinence of urine     Objective:  There were no vitals taken for this visit. There is no height or weight on file to calculate BMI. Physical Exam: GEN: alert, well developed HEENT: clear conjunctiva, MMM LUNGS: unlabored respiration  Skin Testing:  Skin prick testing was placed, which includes aeroallergens/foods, histamine control, and saline control.  Verbal consent was obtained prior to placing test.  Patient tolerated procedure well.  Allergy testing results were read and interpreted by myself, documented by clinical staff. Adequate positive and negative control.  Positive results to:  Results discussed with patient/family.  Airborne Adult Perc - 11/27/23 0908     Time Antigen Placed 0908    Allergen Manufacturer Jestine    Location Back    Number of Test 55    1. Control-Buffer 50% Glycerol Negative    2. Control-Histamine 3+    3. Bahia Negative    4. Bermuda Negative    5. Johnson Negative    6. [place]  Blue Negative    7. Meadow Fescue Negative    8. Perennial Rye Negative    9. Timothy Negative    10. Ragweed Mix Negative    11. Cocklebur 2+    12. Plantain,  English Negative    13. Baccharis 3+    14. Dog Fennel Negative    15. Russian Thistle 3+    16. Lamb's Quarters Negative    17. Sheep Sorrell  Negative    18. Rough Pigweed 2+    19. Marsh Elder, Rough 3+    20. Mugwort, Common Negative    21. Box, Elder Negative    22. Cedar, red Negative    23. Sweet Gum Negative    24. Pecan Pollen Negative    25. Pine Mix Negative    26. Walnut, Black Pollen Negative    27. Red Mulberry Negative    28. Ash Mix Negative    29. Birch Mix Negative    30. Beech American Negative    31. Cottonwood, Eastern Negative    32. Hickory, White Negative    33. Maple Mix Negative    34. Oak, Eastern Mix Negative    35. Sycamore Eastern Negative    36. Alternaria Alternata Negative    37. Cladosporium Herbarum Negative    38. Aspergillus Mix Negative    39. Penicillium Mix Negative    40. Bipolaris Sorokiniana (Helminthosporium) Negative    41. Drechslera Spicifera (Curvularia) Negative    42. Mucor Plumbeus Negative    43. Fusarium Moniliforme Negative    44. Aureobasidium Pullulans (pullulara) Negative    45. Rhizopus Oryzae Negative    46. Botrytis Cinera Negative    47. Epicoccum Nigrum Negative    48. Phoma Betae Negative    49. Dust Mite Mix Negative  50. Cat Hair 10,000 BAU/ml Negative    51.  Dog Epithelia Negative    52. Mixed Feathers Negative    53. Horse Epithelia Negative    54. Cockroach, German Negative    55. Tobacco Leaf Negative          13 Food Perc - 11/27/23 9090       Test Information   Time Antigen Placed 9090    Allergen Manufacturer Jestine    Location Back    Number of allergen test 13          Food Adult Perc - 11/27/23 0900     Time Antigen Placed 9089    Allergen Manufacturer Jestine    Location Back    Number of allergen test 14    1. Peanut Negative    2. Soybean Negative    3. Wheat Negative    4. Sesame Negative    5. Milk, Cow Negative    6. Casein Negative    7. Egg White, Chicken Negative    8. Shellfish Mix Negative    9. Fish Mix Negative    10. Cashew Negative    11. Walnut Food Negative    12. Almond Negative    13. Hazelnut  Negative    34. Chicken Meat Negative           Assessment:   1. Seasonal allergic rhinitis due to pollen   2. Allergic urticaria     Plan/Recommendations:  Alpha Gal Allergy:  - please strictly avoid all mammalian meat. Okay to eat chicken, turkey, seafood.  - Initial rxns: 09/2023 with itching, hives, swelling, dizziness/LOC.   - sIgE 09/2023: positive to alpha gal; normal tryptase but on elevated side; can consider retesting if reactions recur to trend  - for SKIN only reaction, okay to take Zyrtec 10 mg every 12 hours as needed - for SKIN + ANY additional symptoms, OR IF concern for LIFE THREATENING reaction = Epipen Autoinjector EpiPen 0.3 mg. - If using Epinephrine autoinjector, call 911 or go to the ER.    Allergic Rhinitis: - SPT 10/2023: positive to weed pollen  - Use nasal saline rinses before nose sprays such as with Neilmed Sinus Rinse.  Use distilled water.   - Use Zyrtec 10 mg daily as needed for runny nose, sneezing, itchy watery eyes. - If symptoms worsen, use Flonase 2 sprays each nostril daily.  Aim upward and outward.   ALLERGEN AVOIDANCE MEASURES   Pollen Avoidance Pollen levels are highest during the mid-day and afternoon.  Consider this when planning outdoor activities. Avoid being outside when the grass is being mowed, or wear a mask if the pollen-allergic person must be the one to mow the grass. Keep the windows closed to keep pollen outside of the home. Use an air conditioner to filter the air. Take a shower, wash hair, and change clothing after working or playing outdoors during pollen season.    Return in about 3 months (around 02/27/2024).  Arleta Blanch, MD [facility] 

## 2023-11-27 NOTE — Patient Instructions (Addendum)
 Alpha Gal Allergy:  - please strictly avoid all mammalian meat. Okay to eat chicken, turkey, seafood.  - for SKIN only reaction, okay to take Zyrtec 10 mg every 12 hours as needed - for SKIN + ANY additional symptoms, OR IF concern for LIFE THREATENING reaction = Epipen Autoinjector EpiPen 0.3 mg. - If using Epinephrine autoinjector, call 911 or go to the ER.    Allergic Rhinitis: - SPT 10/2023: positive to weed pollen  - Use nasal saline rinses before nose sprays such as with Neilmed Sinus Rinse.  Use distilled water.   - Use Zyrtec 10 mg daily as needed for runny nose, sneezing, itchy watery eyes. - If symptoms worsen, use Flonase 2 sprays each nostril daily.  Aim upward and outward.   ALLERGEN AVOIDANCE MEASURES   Pollen Avoidance Pollen levels are highest during the mid-day and afternoon.  Consider this when planning outdoor activities. Avoid being outside when the grass is being mowed, or wear a mask if the pollen-allergic person must be the one to mow the grass. Keep the windows closed to keep pollen outside of the home. Use an air conditioner to filter the air. Take a shower, wash hair, and change clothing after working or playing outdoors during pollen season.

## 2023-12-11 NOTE — Telephone Encounter (Signed)
 SABRA

## 2023-12-15 DIAGNOSIS — L570 Actinic keratosis: Secondary | ICD-10-CM | POA: Diagnosis not present

## 2023-12-15 DIAGNOSIS — L578 Other skin changes due to chronic exposure to nonionizing radiation: Secondary | ICD-10-CM | POA: Diagnosis not present

## 2023-12-15 DIAGNOSIS — L814 Other melanin hyperpigmentation: Secondary | ICD-10-CM | POA: Diagnosis not present

## 2024-02-05 NOTE — Progress Notes (Signed)
 Candace Lopez                                          MRN: [id number]   02/05/2024   The [facility] Quality Team Specialist reviewed this patient medical record for the purposes of chart review for care gap closure. The following were reviewed: chart review for care gap closure-kidney health evaluation for diabetes:eGFR  and uACR.    [facility]BCI Quality Team

## 2024-02-13 ENCOUNTER — Other Ambulatory Visit: Payer: Self-pay | Admitting: Family Medicine

## 2024-02-13 DIAGNOSIS — Z7985 Long-term (current) use of injectable non-insulin antidiabetic drugs: Secondary | ICD-10-CM

## 2024-02-13 DIAGNOSIS — E119 Type 2 diabetes mellitus without complications: Secondary | ICD-10-CM

## 2024-02-18 ENCOUNTER — Ambulatory Visit: Payer: Self-pay

## 2024-02-18 NOTE — Telephone Encounter (Signed)
 Patient notified and verbalized understanding.

## 2024-02-18 NOTE — Telephone Encounter (Signed)
 FYI Only or Action Required?: Action required by provider: update on patient condition and call with recommendations on medication .  Patient was last seen in primary care on 10/28/2023 by Jolinda Norene HERO, DO.  Called Nurse Triage reporting Allergic Reaction.  Symptoms began yesterday.  Interventions attempted: OTC medications: Benadryl.  Symptoms are: rapidly improving.  Triage Disposition: See PCP When Office is Open (Within 3 Days)  Patient/caregiver understands and will follow disposition?: Yes  Message from Miquel SAILOR sent at 02/18/2024  8:30 AM EST  Reason for Triage: PT went to ER on 01/21-PT had Allergic reaction to mayo/swollen throat up/Red all over body   Reason for Disposition  Mild widespread rash  (Exception: Heat rash lasting 3 days or less.)  Answer Assessment - Initial Assessment Questions Patient had baked potato with margarine yesterday and the meat extract caused her throat to start swelling, and red welts to form over her entire body. Her face, ears, arms, chest, legs. Itching. Benadryl and assessed in ED.  Throat swelling has gone down signifficantly and red whelts are resolving but still present denies CP, SOB, dizziness, fever  ED stopped her Lisinopril  and ASA and advised her to have Dr KANDICE advise on when to restart- ED HFU appt made for 2/2 and placed on the waitlist to be seen sooner. Please advise if pt should restart meds prior to appt.    1. APPEARANCE of RASH: What does the rash look like? (e.g., blisters, dry flaky skin, red spots, redness, sores)     Red whelps  2. SIZE: How big are the spots? (e.g., tip of pen, eraser, coin; inches, centimeters)     Solid face, chest, legs, ear 3. LOCATION: Where is the rash located?     Generalized all over-- face, chest, abdomen, ears, legs 4. COLOR: What color is the rash? (Note: It is difficult to assess rash color in people with darker-colored skin. When this situation occurs, simply ask the caller to  describe what they see.)     red 5. ONSET: When did the rash begin?     denies 6. FEVER: Do you have a fever? If Yes, ask: What is your temperature, how was it measured, and when did it start?     denies 7. ITCHING: Does the rash itch? If Yes, ask: How bad is the itch? (Scale 1-10; or mild, moderate, severe)     moderate 8. CAUSE: What do you think is causing the rash?     Ate meal with butter that she is allergic to on accident 9. MEDICINE FACTORS: Have you started any new medicines within the last 2 weeks? (e.g., antibiotics)      Denies--- accidentally ate meat extract  10. OTHER SYMPTOMS: Do you have any other symptoms? (e.g., dizziness, headache, sore throat, joint pain)       Throat swelling-- mostly resolved  Protocols used: Rash or Redness - [facility]

## 2024-02-18 NOTE — Telephone Encounter (Signed)
Ok to restart lisinopril  

## 2024-02-24 ENCOUNTER — Ambulatory Visit: Admitting: Internal Medicine

## 2024-02-26 NOTE — Nursing Note (Signed)
 IV's removed. No CM needs. AVS printed and education completed. No questions from patient or family at this time.

## 2024-02-26 NOTE — Discharge Summary (Signed)
 "  [facility]  Discharge Summary  PCP: Norene Fielding, DO Discharge Details   Admit date:         02/23/2024 Discharge date and time:       02/26/2024 Hospital LOS:    4  days  Active Hospital Problems   Diagnosis Date Noted POA   Diabetes (*) 02/23/2024 Unknown   Hypertension associated with diabetes (*) 03/14/2016 Yes   Restless leg syndrome, familial, uncontrolled 03/14/2016 Yes   Dyslipidemia with high LDL and low HDL  Yes    Resolved Hospital Problems   Diagnosis Date Noted Date Resolved POA   *Severe sepsis (*) 02/23/2024 02/26/2024 Yes      Current Discharge Medication List     START taking these medications      Details  aspirin  81 mg chewable tablet  Chew two tablets (162 mg dose) by mouth daily.   cefUROXime 500 mg tablet Commonly known as: CEFTIN  Take one tablet (500 mg dose) by mouth 2 (two) times daily for 5 days. Quantity: 10 tablet   guaiFENesin  100 mg/5 mL Soln Commonly known as: SM TUSSIN MUCUS,ROBITUSSIN  Take 10 mLs (200 mg dose) by mouth every 6 (six) hours as needed. Quantity: 1200 mL   oseltamivir phosphate 30 mg capsule Commonly known as: TAMIFLU  Take one capsule (30 mg dose) by mouth 2 (two) times daily for 3 days. Indication: Flu Quantity: 6 capsule       CONTINUE these medications which have CHANGED      Details  atorvastatin  40 mg tablet Commonly known as: LIPITOR What changed: Another medication with the same name was removed. Continue taking this medication, and follow the directions you see here.  Take one tablet (40 mg dose) by mouth daily.   lisinopril  10 mg tablet Commonly known as: PRINIVIL ,ZESTRIL  What changed: Another medication with the same name was removed. Continue taking this medication, and follow the directions you see here.  Take one tablet (10 mg dose) by mouth daily.   metFORMIN  ER 500 mg 24 hr tablet Commonly known as:  GLUCOPHAGE -XR What changed: Another medication with the same name was removed. Continue taking this medication, and follow the directions you see here.  Take one tablet (500 mg dose) by mouth 2 (two) times daily.       CONTINUE these medications which have NOT CHANGED      Details  alendronate  70 mg tablet Commonly known as: FOSAMAX   Take one tablet (70 mg dose) by mouth once a week at 0900. Tuesdays   amLODIPine besylate 5 mg tablet Commonly known as: NORVASC  Take one tablet (5 mg dose) by mouth daily. Quantity: 30 tablet   EPINEPHrine  0.3 mg/0.3 mL injection Commonly known as: EPIPEN  2-PAK  Inject 0.3 mLs (0.3 mg dose) into the muscle once as needed for Anaphylaxis for up to 1 dose. Quantity: 1 each   famotidine  20 mg tablet Commonly known as: PEPCID   Take one tablet (20 mg dose) by mouth 2 (two) times daily for 5 days. Quantity: 10 tablet   glipiZIDE  ER 5 mg 24 hr tablet Commonly known as: GLUCOTROL  XL  Take one tablet (5 mg dose) by mouth 1 (one) time each day with breakfast.   metoprolol  tartrate 25 mg tablet Commonly known as: LOPRESSOR   Take one tablet (25 mg dose) by mouth 2 (two) times daily.   pramipexole  dihydrochloride 0.25 mg tablet Commonly known as: MIRAPEX   Take one tablet (0.25 mg  dose) by mouth at bedtime.   TURMERIC PO  Take 2 capsules by mouth daily.   Vitamin D  1000 units tablet  Take one tablet (1,000 Units dose) by mouth daily.   zolpidem  5 MG tablet Commonly known as: AMBIEN   Take one tablet (5 mg dose) by mouth.      * You might also be taking other medications not listed above. If you have questions about any of your other medications, talk to the person who prescribed them or your Primary Care Provider.          STOP taking these medications    cetirizine 10 mg tablet Commonly known as: ZYRTEC   metoprolol  succinate 25 mg 24 hr tablet Commonly known as: TOPROL -XL   semaglutide  (1 mg/dose) 4 mg/3 mL Sopn pen Commonly known  as: OZEMPIC          Hospital Course   Indication for Admission/chief complaint: RR:[place] Mental Status   History of Present Illness:  Candace Lopez is a 74 y.o. female w/ a h/o HLD, HTN, DM, RLS presents from home with concern for generalized weakness, altered mental status, hypoxia and hypotension.     Pt reports she has been feeling poorly for the last 48 hours.  She report cough, vomiting, diarrhea, SOB and severe fatigue/diffuse weakness.  She has been sleeping a lot and daughter called pt today whom seemed somewhat confused and pt was referred to EMS.  EMS was called and pt was noted to be febrile to 102 and hypotensive.  She was given an IVF bolus and brought to the ED.  In the ED, pt was noted to be febrile and hypotensive as well.  Family reports her mental status is back to her baseline. . She reports feeling somewhat better, but still very fatigued.  She is unsure of a true sick contact, but has been in close contact with grandchild of late.  She denies any abdominal pain.  She denies any dysuria or hematuria.  She denies any HA or changes in vision.    Hospital Course:       1.  Acute hypoxic respiratory failure secondary to suspected sepsis and influenza A 2.  Acute metabolic encephalopathy Patient had dramatically improved within a day.  She is now saturating 96% on room air, her cultures had remained negative and I think sepsis was ruled out.  It however appears that next to her influenza A she has a superimposed bacterial pneumonia. She is ambulating independently alert oriented x 3 She is being discharged on Tamiflu and Ceftin 3.  There was a self-reported diarrhea however she more struggled with the constipation in the hospital requiring laxatives 4.  Diabetes mellitus she is returning to her home regimen 5.  Hypertension we are resuming her usual medications  Recommendations to physicians/followup needed: Primary care physician with a scheduled  appointment    Physical Exam: Vitals:   02/26/24 0744  BP: 121/75  Pulse: 80  Resp: 22  Temp: 97.3 F (36.3 C)  SpO2: 96%   On day of discharge patient without any complaints, she is ambulating freely, she is without oxygen.  I have discussed her diagnosis is treatment plan and recommendation with her and her daughter over the phone  Labs on Discharge:  Recent Labs    Units 02/26/24 0929 02/25/24 0449 02/24/24 0246 02/23/24 1255  WBC 10e3cells/uL 2.5* 2.6* 3.3* 4.8  HGB gm/dL 87.2 88.6 88.3 87.6  HCT % 38.0 34.7 35.2 35.7  PLT 10e3cells/uL 205 156  177 195   Recent Labs    Units 02/26/24 0929 02/25/24 0449 02/24/24 0246 02/23/24 1255  NA mmol/L 139 139 136 135*  K mmol/L 4.5 3.8 4.9 3.9  CL mmol/L 102 105 106 103  CO2 mmol/L 26 23 23 20   BUN mg/dL 12 13 17 19   CREATININE mg/dL 9.12 9.16 9.01 9.22  CALCIUM  mg/dL 9.3 8.5* 8.1* 8.4*   Recent Labs    Units 02/26/24 0929 02/25/24 0449 02/24/24 0246 02/23/24 1255  BILITOT mg/dL 0.8 0.8 1.0 1.8*  AST IU/L 22 24 30  34  ALT IU/L 23 24 26 25   ALKPHOS IU/L 57 50 51 56  ALBUMIN gm/dL 4.2 3.4* 3.3* 3.6   Recent Labs    Units 02/24/24 0246 02/23/24 1719  TSH uIU/mL 0.97  --   HGBA1C %  --  7.2*   No results for input(s): LABPROT, INR, PTT in the last 168 hours. No results for input(s): CHOL, LDL, HDL, TRIG in the last 168 hours. No results for input(s): TROPONIN, CK in the last 168 hours.  Invalid input(s): CK-MB  Diagnostics: CT Head WO Contrast Result Date: 02/23/2024 INDICATION: Trauma Head  850.90 COMPARISON:  None.  TECHNIQUE:    Multiple axial CT images obtained from the skull base to vertex without IV contrast. FINDINGS:   No intracranial hemorrhage. Scattered hypoattenuation throughout the periventricular and subcortical white matter. No intracranial mass, mass effect, or midline shift.  Ventricles: No hydrocephalus. Visualized paranasal sinuses: Partial opacification of the ethmoid and  maxillary sinuses. Visualized mastoid air cells: Clear. Calvarium: Intact.   IMPRESSION: No acute intracranial abnormality. Sinus disease, sequela of chronic small vessel ischemic disease. Electronically Signed by: Reyes Luna, MD on 02/23/2024 1:58 PM  CT Spine Cervical WO Contrast Result Date: 02/23/2024 INDICATION: trauma  COMPARISON:  None.  TECHNIQUE: Routine noncontrast CT cervical spine was performed. Coronal and sagittal reformatted images were obtained and reviewed. Radiation dose reduction was utilized (automated exposure control, mA or kV adjustment based on patient size, or iterative image reconstruction). FINDINGS: OSSEOUS STRUCTURES: No acute fracture or dislocation. Vertebral body heights are preserved. Cervical spinal alignment is preserved. No destructive osseous lesions. Mild degenerative changes. SOFT TISSUES: No perivertebral soft tissue abnormalities. ADDITIONAL/INCIDENTAL: N./A.   IMPRESSION: No definite fracture dislocation seen. Electronically Signed by: Alicia Davidson, MD on 02/23/2024 1:56 PM  XR Chest Ap Portable Result Date: 02/23/2024 TECHNIQUE: One AP view of the chest COMPARISON: October 26, 2023. INDICATION: Cough and fever. FINDINGS: Hazy opacities in the left lung base which are nonspecific. Right lung is clear. Unchanged cardiac and mediastinal contours. No pleural effusion or pneumothorax. Fixation rod is identified in the left humerus.   IMPRESSION: Hazy opacities in the left lung base which are nonspecific and could represent atelectasis or infection. Electronically Signed by: Nadara Fret, MD on 02/23/2024 1:48 PM     Post Hospital Care   Discharge Procedure Orders  Consistent Carbohydrate Diet (diabetic)   Activity as tolerated    Diet: Consistent Carbohydrate Consistent Carbohydrate Diet (diabetic)  Potential for Rehab:        Excellent Code Status:   Full Code Disposition: Home   Followup appointments: No future appointments.   Time spent in  discharge process:  total time spent 45 minutes   Electronically signed: [place], MD 02/26/2024 / 11:11 AM   *Some images could not be shown."

## 2024-02-26 NOTE — Progress Notes (Signed)
 Patient or surrogate decision-maker refused albuterol neb as ordered by the provider. The risks, benefits, and any alternative device/therapy (if applicable) was discussed with the patient or surrogate decision-maker.  Patient or surrogate decision-maker appeared to comprehend the decision: Yes. Provider notified: No.

## 2024-02-29 ENCOUNTER — Telehealth: Payer: Self-pay

## 2024-02-29 ENCOUNTER — Inpatient Hospital Stay: Admitting: Family Medicine

## 2024-02-29 NOTE — Transitions of Care (Post Inpatient/ED Visit) (Signed)
" ° °  02/29/2024  Name: MAURISHA MONGEAU MRN: [id number] DOB: 1950/12/08  Today's TOC FU Call Status: Today's TOC FU Call Status:: Unsuccessful Call (1st Attempt) Unsuccessful Call (1st Attempt) Date: 02/29/24  Attempted to reach the patient regarding the most recent Inpatient/ED visit.  Left a HIPAA approved voicemail message to phone number provided in demographics per DPR.    Follow Up Plan: Additional outreach attempts will be made to reach the patient to complete the Transitions of Care (Post Inpatient/ED visit) call.   Richerd Fish, RN, BSN, CCM [facility], [facility] RN Care Manager Direct Dial: [phone]        "

## 2024-03-07 ENCOUNTER — Inpatient Hospital Stay: Admitting: Family Medicine

## 2024-03-16 IMAGING — DX DG ABDOMEN 1V
2 series · 2 of 2 positions shown · non-contrast
Comparison: None.

CLINICAL DATA: 70-year-old female with left lower quadrant
abdominal pain.

EXAM:
ABDOMEN - 1 VIEW

[abdomen kub (1 of 2)]
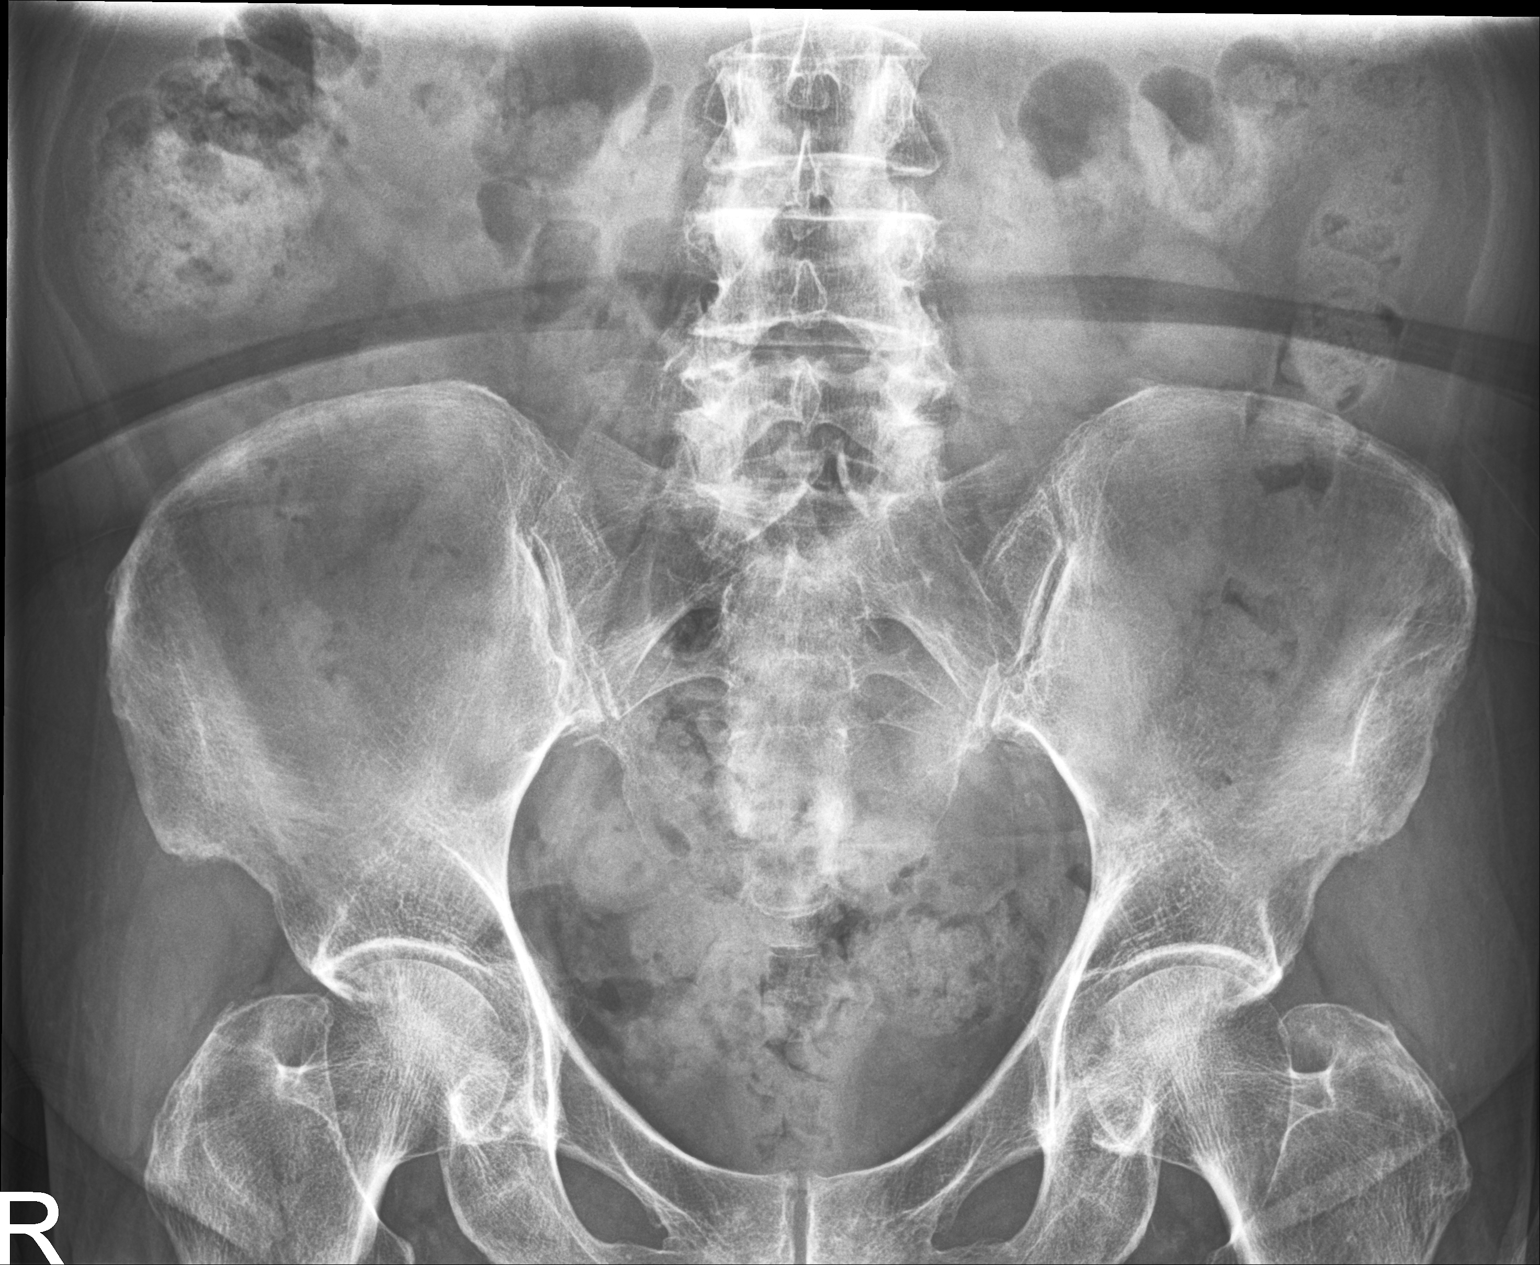

[abdomen kub (2 of 2)]
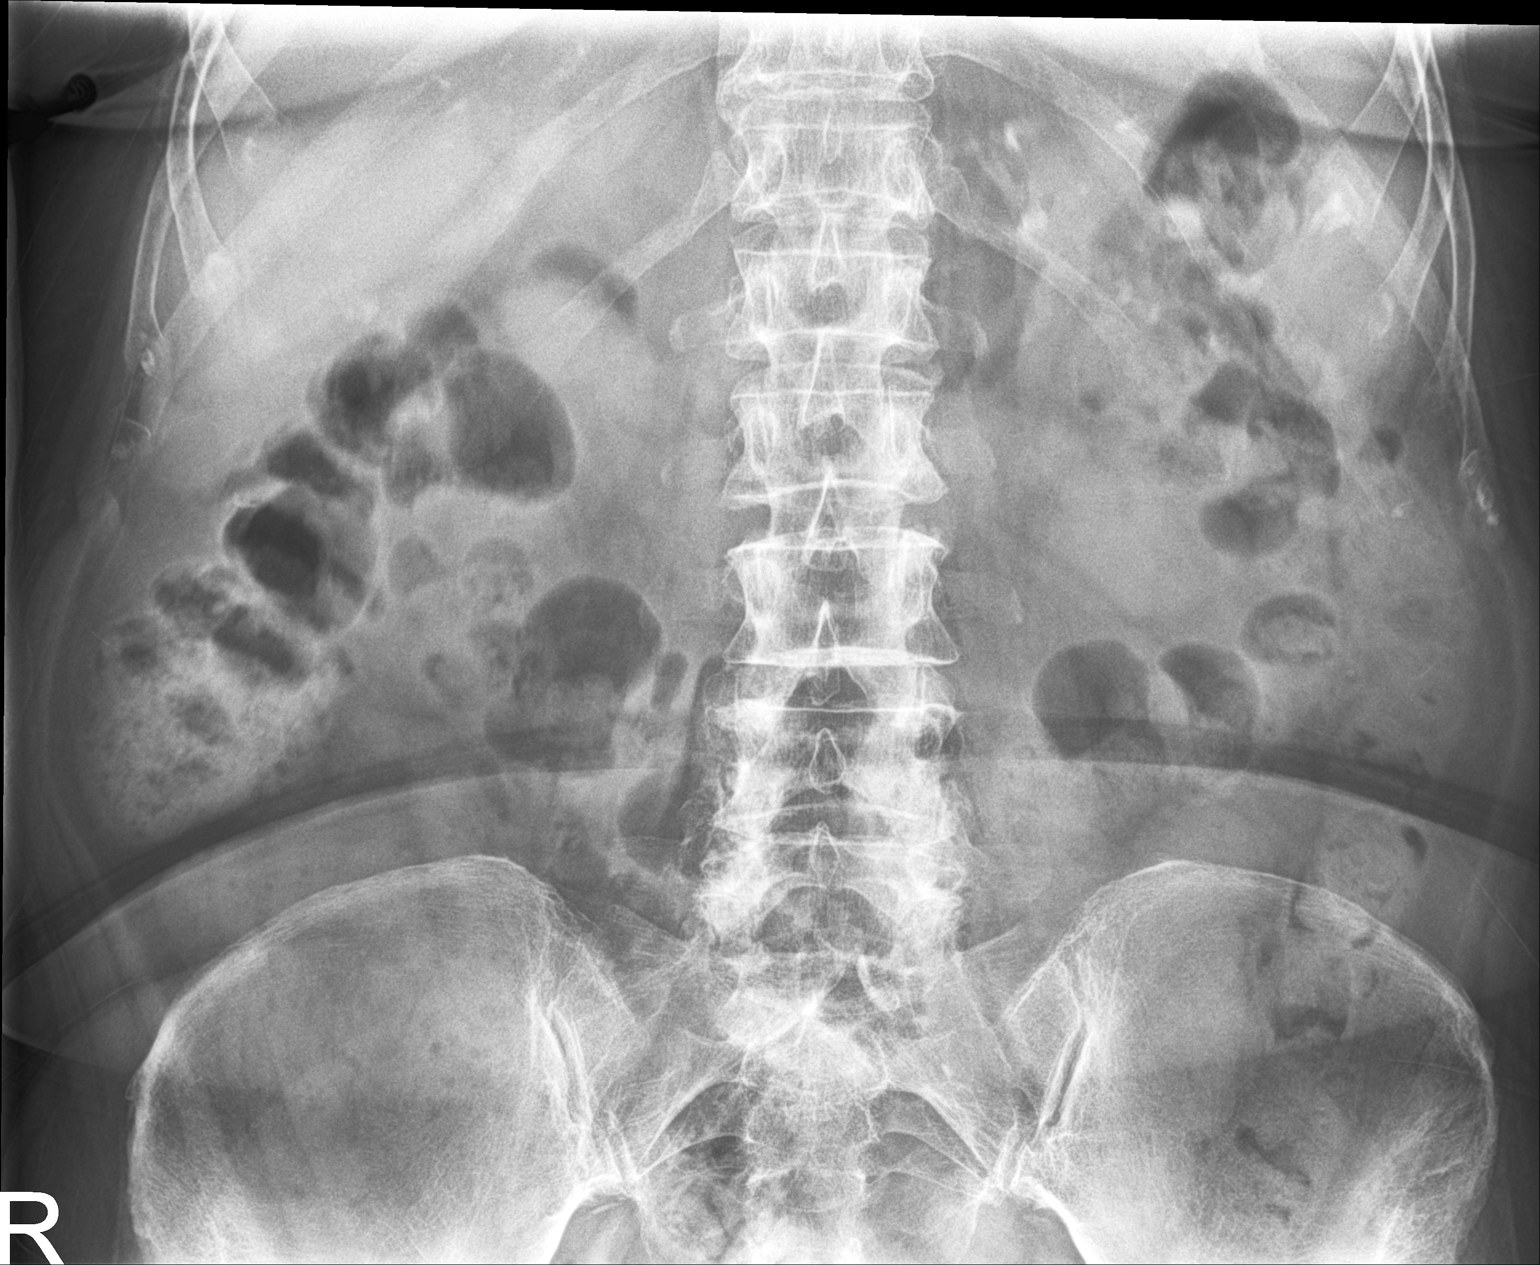

[2 of 2 positions shown; findings below may reference images not displayed]

FINDINGS: Two views of the abdomen and pelvis. Non obstructed bowel gas
pattern. Abdominal and pelvic visceral contours are within normal
limits. Evidence of Aortoiliac calcified atherosclerosis, but no
nonvascular calcifications identified. Visible osseous structures
appear normal for age.
IMPRESSION: Negative.

## 2024-05-31 ENCOUNTER — Ambulatory Visit
